# Patient Record
Sex: Female | Born: 1980 | Race: White | Hispanic: No | Marital: Single | Smoking: Current every day smoker
Health system: Southern US, Community
[De-identification: ages and names within clinical notes are randomized; demographics above are authoritative.]

## PROBLEM LIST (undated history)

## (undated) DIAGNOSIS — D3500 Benign neoplasm of unspecified adrenal gland: Secondary | ICD-10-CM

## (undated) DIAGNOSIS — K2970 Gastritis, unspecified, without bleeding: Secondary | ICD-10-CM

## (undated) DIAGNOSIS — M5412 Radiculopathy, cervical region: Secondary | ICD-10-CM

## (undated) DIAGNOSIS — M791 Myalgia, unspecified site: Secondary | ICD-10-CM

## (undated) DIAGNOSIS — F4323 Adjustment disorder with mixed anxiety and depressed mood: Secondary | ICD-10-CM

## (undated) DIAGNOSIS — R1115 Cyclical vomiting syndrome unrelated to migraine: Secondary | ICD-10-CM

## (undated) DIAGNOSIS — N289 Disorder of kidney and ureter, unspecified: Secondary | ICD-10-CM

## (undated) DIAGNOSIS — F410 Panic disorder [episodic paroxysmal anxiety] without agoraphobia: Secondary | ICD-10-CM

## (undated) HISTORY — PX: HEMORRHOID SURGERY: SHX153

## (undated) HISTORY — PX: WISDOM TOOTH EXTRACTION: SHX21

## (undated) HISTORY — PX: ESOPHAGOGASTRODUODENOSCOPY: SHX1529

## (undated) HISTORY — DX: Cyclical vomiting syndrome unrelated to migraine: R11.15

## (undated) HISTORY — DX: Benign neoplasm of unspecified adrenal gland: D35.00

---
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
## 2000-05-25 ENCOUNTER — Emergency Department (HOSPITAL_COMMUNITY): Admission: EM | Admit: 2000-05-25 | Discharge: 2000-05-25 | Payer: Self-pay | Admitting: Emergency Medicine

## 2000-05-26 ENCOUNTER — Encounter: Payer: Self-pay | Admitting: Emergency Medicine

## 2000-07-01 ENCOUNTER — Encounter: Admission: RE | Admit: 2000-07-01 | Discharge: 2000-08-16 | Payer: Self-pay | Admitting: Diagnostic Radiology

## 2000-09-19 ENCOUNTER — Emergency Department (HOSPITAL_COMMUNITY): Admission: EM | Admit: 2000-09-19 | Discharge: 2000-09-19 | Payer: Self-pay | Admitting: Emergency Medicine

## 2003-01-19 ENCOUNTER — Other Ambulatory Visit: Admission: RE | Admit: 2003-01-19 | Discharge: 2003-01-19 | Payer: Self-pay | Admitting: Internal Medicine

## 2003-05-08 ENCOUNTER — Encounter: Payer: Self-pay | Admitting: Emergency Medicine

## 2003-05-08 ENCOUNTER — Inpatient Hospital Stay (HOSPITAL_COMMUNITY): Admission: AD | Admit: 2003-05-08 | Discharge: 2003-05-08 | Payer: Self-pay | Admitting: *Deleted

## 2003-05-08 ENCOUNTER — Emergency Department (HOSPITAL_COMMUNITY): Admission: EM | Admit: 2003-05-08 | Discharge: 2003-05-08 | Payer: Self-pay | Admitting: Emergency Medicine

## 2003-05-09 ENCOUNTER — Inpatient Hospital Stay (HOSPITAL_COMMUNITY): Admission: AD | Admit: 2003-05-09 | Discharge: 2003-05-09 | Payer: Self-pay | Admitting: Obstetrics and Gynecology

## 2003-05-10 ENCOUNTER — Inpatient Hospital Stay (HOSPITAL_COMMUNITY): Admission: AD | Admit: 2003-05-10 | Discharge: 2003-05-10 | Payer: Self-pay | Admitting: Family Medicine

## 2003-10-23 ENCOUNTER — Ambulatory Visit (HOSPITAL_COMMUNITY): Admission: RE | Admit: 2003-10-23 | Discharge: 2003-10-23 | Payer: Self-pay | Admitting: Obstetrics & Gynecology

## 2004-01-12 ENCOUNTER — Inpatient Hospital Stay (HOSPITAL_COMMUNITY): Admission: AD | Admit: 2004-01-12 | Discharge: 2004-01-13 | Payer: Self-pay | Admitting: Obstetrics

## 2004-03-25 ENCOUNTER — Inpatient Hospital Stay (HOSPITAL_COMMUNITY): Admission: AD | Admit: 2004-03-25 | Discharge: 2004-03-25 | Payer: Self-pay | Admitting: Obstetrics

## 2004-03-26 ENCOUNTER — Inpatient Hospital Stay (HOSPITAL_COMMUNITY): Admission: AD | Admit: 2004-03-26 | Discharge: 2004-03-28 | Payer: Self-pay | Admitting: Obstetrics

## 2005-07-21 ENCOUNTER — Ambulatory Visit: Payer: Self-pay | Admitting: Internal Medicine

## 2005-07-21 ENCOUNTER — Other Ambulatory Visit: Admission: RE | Admit: 2005-07-21 | Discharge: 2005-07-21 | Payer: Self-pay | Admitting: Internal Medicine

## 2007-12-12 ENCOUNTER — Emergency Department (HOSPITAL_COMMUNITY): Admission: EM | Admit: 2007-12-12 | Discharge: 2007-12-12 | Payer: Self-pay | Admitting: Emergency Medicine

## 2009-01-01 ENCOUNTER — Emergency Department (HOSPITAL_BASED_OUTPATIENT_CLINIC_OR_DEPARTMENT_OTHER): Admission: EM | Admit: 2009-01-01 | Discharge: 2009-01-01 | Payer: Self-pay | Admitting: Emergency Medicine

## 2009-06-29 ENCOUNTER — Emergency Department (HOSPITAL_BASED_OUTPATIENT_CLINIC_OR_DEPARTMENT_OTHER): Admission: EM | Admit: 2009-06-29 | Discharge: 2009-06-29 | Payer: Self-pay | Admitting: Emergency Medicine

## 2009-08-08 ENCOUNTER — Emergency Department (HOSPITAL_BASED_OUTPATIENT_CLINIC_OR_DEPARTMENT_OTHER): Admission: EM | Admit: 2009-08-08 | Discharge: 2009-08-08 | Payer: Self-pay | Admitting: Emergency Medicine

## 2009-10-05 ENCOUNTER — Emergency Department (HOSPITAL_BASED_OUTPATIENT_CLINIC_OR_DEPARTMENT_OTHER): Admission: EM | Admit: 2009-10-05 | Discharge: 2009-10-05 | Payer: Self-pay | Admitting: Emergency Medicine

## 2009-11-22 ENCOUNTER — Emergency Department (HOSPITAL_BASED_OUTPATIENT_CLINIC_OR_DEPARTMENT_OTHER): Admission: EM | Admit: 2009-11-22 | Discharge: 2009-11-23 | Payer: Self-pay | Admitting: Emergency Medicine

## 2010-04-13 ENCOUNTER — Emergency Department (HOSPITAL_BASED_OUTPATIENT_CLINIC_OR_DEPARTMENT_OTHER): Admission: EM | Admit: 2010-04-13 | Discharge: 2010-04-14 | Payer: Self-pay | Admitting: Emergency Medicine

## 2010-08-14 ENCOUNTER — Emergency Department (HOSPITAL_BASED_OUTPATIENT_CLINIC_OR_DEPARTMENT_OTHER): Admission: EM | Admit: 2010-08-14 | Discharge: 2010-01-10 | Payer: Self-pay | Admitting: Emergency Medicine

## 2010-09-08 ENCOUNTER — Emergency Department (HOSPITAL_BASED_OUTPATIENT_CLINIC_OR_DEPARTMENT_OTHER)
Admission: EM | Admit: 2010-09-08 | Discharge: 2010-09-09 | Disposition: A | Discharge status: Other Acute Inpatient Hospital | Payer: Self-pay | Source: Home / Self Care | Admitting: Emergency Medicine

## 2010-09-29 ENCOUNTER — Encounter: Payer: Self-pay | Admitting: Obstetrics & Gynecology

## 2010-11-23 LAB — URINALYSIS, ROUTINE W REFLEX MICROSCOPIC
Bilirubin Urine: NEGATIVE
Glucose, UA: NEGATIVE mg/dL
Hgb urine dipstick: NEGATIVE
Ketones, ur: 15 mg/dL — AB
Leukocytes, UA: NEGATIVE
Nitrite: NEGATIVE
Protein, ur: >300 mg/dL — AB
Specific Gravity, Urine: 1.03 (ref 1.005–1.030)
Urobilinogen, UA: 1 mg/dL (ref 0.0–1.0)
pH: 7 (ref 5.0–8.0)

## 2010-11-23 LAB — COMPREHENSIVE METABOLIC PANEL
ALT: 13 U/L (ref 0–35)
AST: 20 U/L (ref 0–37)
Albumin: 5.3 g/dL — ABNORMAL HIGH (ref 3.5–5.2)
Alkaline Phosphatase: 83 U/L (ref 39–117)
BUN: 10 mg/dL (ref 6–23)
CO2: 26 mEq/L (ref 19–32)
Calcium: 10.2 mg/dL (ref 8.4–10.5)
Chloride: 98 mEq/L (ref 96–112)
Creatinine, Ser: 0.7 mg/dL (ref 0.4–1.2)
GFR calc Af Amer: >60 mL/min (ref >60)
GFR calc non Af Amer: >60 mL/min (ref >60)
Glucose, Bld: 209 mg/dL — ABNORMAL HIGH (ref 70–99)
Potassium: 3.9 mEq/L (ref 3.5–5.1)
Sodium: 136 mEq/L (ref 135–145)
Total Bilirubin: 0.7 mg/dL (ref 0.3–1.2)
Total Protein: 8.6 g/dL — ABNORMAL HIGH (ref 6.0–8.3)

## 2010-11-23 LAB — URINE MICROSCOPIC-ADD ON

## 2010-11-23 LAB — PREGNANCY, URINE: Preg Test, Ur: NEGATIVE

## 2010-11-23 LAB — LIPASE, BLOOD: Lipase: 46 U/L (ref 23–300)

## 2010-11-25 LAB — URINALYSIS, ROUTINE W REFLEX MICROSCOPIC
Bilirubin Urine: NEGATIVE
Glucose, UA: NEGATIVE mg/dL
Hgb urine dipstick: NEGATIVE
Ketones, ur: 40 mg/dL — AB
Leukocytes, UA: NEGATIVE
Nitrite: NEGATIVE
Protein, ur: 100 mg/dL — AB
Specific Gravity, Urine: 1.036 — ABNORMAL HIGH (ref 1.005–1.030)
Urobilinogen, UA: 1 mg/dL (ref 0.0–1.0)
pH: 6.5 (ref 5.0–8.0)

## 2010-11-25 LAB — PREGNANCY, URINE: Preg Test, Ur: NEGATIVE

## 2010-11-25 LAB — URINE MICROSCOPIC-ADD ON

## 2010-12-01 LAB — URINE MICROSCOPIC-ADD ON

## 2010-12-01 LAB — URINALYSIS, ROUTINE W REFLEX MICROSCOPIC
Bilirubin Urine: NEGATIVE
Glucose, UA: NEGATIVE mg/dL
Hgb urine dipstick: NEGATIVE
Ketones, ur: 15 mg/dL — AB
Leukocytes, UA: NEGATIVE
Nitrite: NEGATIVE
Protein, ur: 30 mg/dL — AB
Specific Gravity, Urine: 1.024 (ref 1.005–1.030)
Urobilinogen, UA: 0.2 mg/dL (ref 0.0–1.0)
pH: 6 (ref 5.0–8.0)

## 2010-12-01 LAB — PREGNANCY, URINE: Preg Test, Ur: NEGATIVE

## 2010-12-09 LAB — PREGNANCY, URINE: Preg Test, Ur: NEGATIVE

## 2010-12-11 LAB — URINALYSIS, ROUTINE W REFLEX MICROSCOPIC
Bilirubin Urine: NEGATIVE
Glucose, UA: NEGATIVE mg/dL
Hgb urine dipstick: NEGATIVE
Ketones, ur: 40 mg/dL — AB
Leukocytes, UA: NEGATIVE
Nitrite: NEGATIVE
Protein, ur: 30 mg/dL — AB
Specific Gravity, Urine: 1.014 (ref 1.005–1.030)
Urobilinogen, UA: 0.2 mg/dL (ref 0.0–1.0)
pH: 6 (ref 5.0–8.0)

## 2010-12-11 LAB — CBC
HCT: 48 % — ABNORMAL HIGH (ref 36.0–46.0)
Hemoglobin: 16.5 g/dL — ABNORMAL HIGH (ref 12.0–15.0)
MCHC: 34.4 g/dL (ref 30.0–36.0)
MCV: 93.5 fL (ref 78.0–100.0)
Platelets: 390 10*3/uL (ref 150–400)
RBC: 5.13 MIL/uL — ABNORMAL HIGH (ref 3.87–5.11)
RDW: 14 % (ref 11.5–15.5)
WBC: 19.5 10*3/uL — ABNORMAL HIGH (ref 4.0–10.5)

## 2010-12-11 LAB — DIFFERENTIAL
Basophils Absolute: 0.3 10*3/uL — ABNORMAL HIGH (ref 0.0–0.1)
Basophils Relative: 1 % (ref 0–1)
Eosinophils Absolute: 0 10*3/uL (ref 0.0–0.7)
Eosinophils Relative: 0 % (ref 0–5)
Lymphocytes Relative: 13 % (ref 12–46)
Lymphs Abs: 2.6 10*3/uL (ref 0.7–4.0)
Monocytes Absolute: 1 10*3/uL (ref 0.1–1.0)
Monocytes Relative: 5 % (ref 3–12)
Neutro Abs: 15.6 10*3/uL — ABNORMAL HIGH (ref 1.7–7.7)
Neutrophils Relative %: 80 % — ABNORMAL HIGH (ref 43–77)

## 2010-12-11 LAB — BASIC METABOLIC PANEL
BUN: 29 mg/dL — ABNORMAL HIGH (ref 6–23)
CO2: 29 mEq/L (ref 19–32)
Calcium: 11 mg/dL — ABNORMAL HIGH (ref 8.4–10.5)
Chloride: 85 mEq/L — ABNORMAL LOW (ref 96–112)
Creatinine, Ser: 1.3 mg/dL — ABNORMAL HIGH (ref 0.4–1.2)
GFR calc Af Amer: 59 mL/min — ABNORMAL LOW (ref >60)
GFR calc non Af Amer: 49 mL/min — ABNORMAL LOW (ref >60)
Glucose, Bld: 113 mg/dL — ABNORMAL HIGH (ref 70–99)
Potassium: 3.4 mEq/L — ABNORMAL LOW (ref 3.5–5.1)
Sodium: 135 mEq/L (ref 135–145)

## 2010-12-11 LAB — PREGNANCY, URINE: Preg Test, Ur: NEGATIVE

## 2010-12-11 LAB — URINE MICROSCOPIC-ADD ON

## 2010-12-17 LAB — RAPID STREP SCREEN (MED CTR MEBANE ONLY): Streptococcus, Group A Screen (Direct): NEGATIVE

## 2011-01-28 ENCOUNTER — Ambulatory Visit: Payer: Self-pay | Admitting: Internal Medicine

## 2011-03-10 ENCOUNTER — Emergency Department (HOSPITAL_BASED_OUTPATIENT_CLINIC_OR_DEPARTMENT_OTHER)
Admission: EM | Admit: 2011-03-10 | Discharge: 2011-03-10 | Disposition: A | Discharge status: Home / Self Care | Payer: Medicaid Other | Attending: Emergency Medicine | Admitting: Emergency Medicine

## 2011-03-10 DIAGNOSIS — N390 Urinary tract infection, site not specified: Secondary | ICD-10-CM | POA: Insufficient documentation

## 2011-03-10 DIAGNOSIS — F411 Generalized anxiety disorder: Secondary | ICD-10-CM | POA: Insufficient documentation

## 2011-03-10 DIAGNOSIS — R300 Dysuria: Secondary | ICD-10-CM | POA: Insufficient documentation

## 2011-03-10 LAB — URINE MICROSCOPIC-ADD ON

## 2011-03-10 LAB — URINALYSIS, ROUTINE W REFLEX MICROSCOPIC
Bilirubin Urine: NEGATIVE
Glucose, UA: NEGATIVE mg/dL
Ketones, ur: 15 mg/dL — AB
Nitrite: NEGATIVE
Protein, ur: 100 mg/dL — AB
Specific Gravity, Urine: 1.031 — ABNORMAL HIGH (ref 1.005–1.030)
Urobilinogen, UA: 1 mg/dL (ref 0.0–1.0)
pH: 6 (ref 5.0–8.0)

## 2011-03-10 LAB — PREGNANCY, URINE: Preg Test, Ur: NEGATIVE

## 2011-03-13 LAB — URINE CULTURE: Culture  Setup Time: 201207040605

## 2011-05-31 ENCOUNTER — Emergency Department (HOSPITAL_BASED_OUTPATIENT_CLINIC_OR_DEPARTMENT_OTHER)
Admission: EM | Admit: 2011-05-31 | Discharge: 2011-05-31 | Disposition: A | Discharge status: Home / Self Care | Payer: Medicaid Other | Attending: Emergency Medicine | Admitting: Emergency Medicine

## 2011-05-31 ENCOUNTER — Encounter: Payer: Self-pay | Admitting: *Deleted

## 2011-05-31 DIAGNOSIS — R1110 Vomiting, unspecified: Secondary | ICD-10-CM | POA: Insufficient documentation

## 2011-05-31 DIAGNOSIS — R109 Unspecified abdominal pain: Secondary | ICD-10-CM | POA: Insufficient documentation

## 2011-05-31 DIAGNOSIS — F17200 Nicotine dependence, unspecified, uncomplicated: Secondary | ICD-10-CM | POA: Insufficient documentation

## 2011-05-31 LAB — URINE MICROSCOPIC-ADD ON

## 2011-05-31 LAB — BASIC METABOLIC PANEL
BUN: 18 mg/dL (ref 6–23)
CO2: 29 mEq/L (ref 19–32)
Calcium: 12 mg/dL — ABNORMAL HIGH (ref 8.4–10.5)
Chloride: 86 mEq/L — ABNORMAL LOW (ref 96–112)
Creatinine, Ser: 1.6 mg/dL — ABNORMAL HIGH (ref 0.50–1.10)
GFR calc Af Amer: 46 mL/min — ABNORMAL LOW (ref >60)
GFR calc non Af Amer: 38 mL/min — ABNORMAL LOW (ref >60)
Glucose, Bld: 120 mg/dL — ABNORMAL HIGH (ref 70–99)
Potassium: 3.6 mEq/L (ref 3.5–5.1)
Sodium: 134 mEq/L — ABNORMAL LOW (ref 135–145)

## 2011-05-31 LAB — URINALYSIS, ROUTINE W REFLEX MICROSCOPIC
Bilirubin Urine: NEGATIVE
Glucose, UA: NEGATIVE mg/dL
Hgb urine dipstick: NEGATIVE
Ketones, ur: 15 mg/dL — AB
Nitrite: NEGATIVE
Protein, ur: 30 mg/dL — AB
Specific Gravity, Urine: 1.014 (ref 1.005–1.030)
Urobilinogen, UA: 0.2 mg/dL (ref 0.0–1.0)
pH: 5.5 (ref 5.0–8.0)

## 2011-05-31 LAB — PREGNANCY, URINE: Preg Test, Ur: NEGATIVE

## 2011-05-31 MED ORDER — METOCLOPRAMIDE HCL 5 MG/ML IJ SOLN
10.0000 mg | Freq: Once | INTRAMUSCULAR | Status: AC
  Administered 2011-05-31: 10 mg via INTRAVENOUS
  Filled 2011-05-31: qty 2

## 2011-05-31 MED ORDER — ONDANSETRON HCL 4 MG/2ML IJ SOLN
4.0000 mg | Freq: Once | INTRAMUSCULAR | Status: AC
  Administered 2011-05-31: 4 mg via INTRAVENOUS
  Filled 2011-05-31: qty 2

## 2011-05-31 MED ORDER — SODIUM CHLORIDE 0.9 % IV BOLUS (SEPSIS)
1000.0000 mL | Freq: Once | INTRAVENOUS | Status: AC
  Administered 2011-05-31: 1000 mL via INTRAVENOUS

## 2011-05-31 NOTE — ED Notes (Signed)
Patient unable to obtain urine specimen at this time.  

## 2011-05-31 NOTE — ED Notes (Signed)
I place a call to the [facility] teaching service, Dr Stephens November answered the call and I connected directly to Deliah Boston, NP on land line.

## 2011-05-31 NOTE — ED Provider Notes (Signed)
History     CSN: [id number] Arrival date & time: 05/31/2011  6:59 PM  Chief Complaint  Patient presents with  . Abdominal Pain    HPI  (Consider location/radiation/quality/duration/timing/severity/associated sxs/prior treatment)  HPI Comments: Pt states that she has a history of similar symptoms  Patient is a 29 y.o. female presenting with abdominal pain. The history is provided by the patient. No language interpreter was used.  Abdominal Pain The primary symptoms of the illness include abdominal pain, nausea and vomiting. The primary symptoms of the illness do not include diarrhea. The current episode started yesterday. The onset of the illness was sudden. The problem has not changed since onset. The patient states that she believes she is currently not pregnant. The patient has not had a change in bowel habit. Symptoms associated with the illness do not include chills, diaphoresis, constipation, urgency, frequency or back pain.    History reviewed. No pertinent past medical history.  History reviewed. No pertinent past surgical history.  History reviewed. No pertinent family history.  History  Substance Use Topics  . Smoking status: Current Everyday Smoker  . Smokeless tobacco: Not on file  . Alcohol Use: Yes    OB History    Grav Para Term Preterm Abortions TAB SAB Ect Mult Living                  Review of Systems  Review of Systems  Constitutional: Negative for chills and diaphoresis.  Gastrointestinal: Positive for nausea, vomiting and abdominal pain. Negative for diarrhea and constipation.  Genitourinary: Negative for urgency and frequency.  Musculoskeletal: Negative for back pain.  All other systems reviewed and are negative.    Allergies  Review of patient's allergies indicates no known allergies.  Home Medications   Current Outpatient Rx  Name Route Sig Dispense Refill  . ALPRAZOLAM 0.25 MG PO TABS Oral Take 0.25 mg by mouth 2 (two) times daily.          Physical Exam    BP 128/74  Pulse 86  Temp(Src) 98 F (36.7 C) (Oral)  Resp 20  Ht 5\' 5"  (1.651 m)  Wt 125 lb (56.7 kg)  BMI 20.80 kg/m2  SpO2 100%  LMP 05/23/2011  Physical Exam  Nursing note and vitals reviewed. Constitutional: She appears well-developed and well-nourished.  HENT:  Head: Normocephalic and atraumatic.  Cardiovascular: Normal rate and regular rhythm.   Pulmonary/Chest: Effort normal and breath sounds normal.  Abdominal: Soft. There is no tenderness.  Musculoskeletal: Normal range of motion.  Neurological: She is alert.  Skin: Skin is warm and dry.  Psychiatric: She has a normal mood and affect.    ED Course  Procedures (including critical care time)  Labs Reviewed  URINALYSIS, ROUTINE W REFLEX MICROSCOPIC - Abnormal; Notable for the following:    Appearance CLOUDY (*)    Ketones, ur 15 (*)    Protein, ur 30 (*)    Leukocytes, UA TRACE (*)    All other components within normal limits  BASIC METABOLIC PANEL - Abnormal; Notable for the following:    Sodium 134 (*)    Chloride 86 (*)    Glucose, Bld 120 (*)    Creatinine, Ser 1.60 (*)    Calcium 12.0 (*)    GFR calc non Af Amer 38 (*)    GFR calc Af Amer 46 (*)    All other components within normal limits  URINE MICROSCOPIC-ADD ON - Abnormal; Notable for the following:    Squamous Epithelial /  LPF FEW (*)    Bacteria, UA FEW (*)    Casts HYALINE CASTS (*) GRANULAR CAST   All other components within normal limits  PREGNANCY, URINE   No results found.   No diagnosis found.   MDM Pt to have cr rechecked:pt tolerating po without any problem and is feeling better      Medical screening examination/treatment/procedure(s) were performed by non-physician practitioner and as supervising physician I was immediately available for consultation/collaboration.  Teressa Lower, NP 05/31/11 2238  Jasmine Awe, MD 05/31/11 2250

## 2011-05-31 NOTE — ED Notes (Signed)
D/c home with no Rx given- denies pain- reports she feels much better

## 2011-05-31 NOTE — ED Notes (Signed)
Pt states that she has had abd, N,V since yesterday. Thinks she may have picked up a stomach bug or ate something bad. Also c/o cramping. "Feel dehydrated"

## 2011-06-02 LAB — DIFFERENTIAL
Basophils Absolute: 0
Basophils Relative: 0
Eosinophils Absolute: 0.2
Eosinophils Relative: 1
Lymphocytes Relative: 24
Lymphs Abs: 3.4
Monocytes Absolute: 1.2 — ABNORMAL HIGH
Monocytes Relative: 9
Neutro Abs: 9 — ABNORMAL HIGH
Neutrophils Relative %: 66

## 2011-06-02 LAB — COMPREHENSIVE METABOLIC PANEL
ALT: 18
AST: 24
Albumin: 5.2
Alkaline Phosphatase: 98
BUN: 25 — ABNORMAL HIGH
CO2: 32
Calcium: 10.7 — ABNORMAL HIGH
Chloride: 85 — ABNORMAL LOW
Creatinine, Ser: 0.93
GFR calc Af Amer: >60
GFR calc non Af Amer: >60
Glucose, Bld: 118 — ABNORMAL HIGH
Potassium: 3.5
Sodium: 131 — ABNORMAL LOW
Total Bilirubin: 1.7 — ABNORMAL HIGH
Total Protein: 8.4 — ABNORMAL HIGH

## 2011-06-02 LAB — URINALYSIS, ROUTINE W REFLEX MICROSCOPIC
Bilirubin Urine: NEGATIVE
Glucose, UA: NEGATIVE
Hgb urine dipstick: NEGATIVE
Ketones, ur: NEGATIVE
Leukocytes, UA: NEGATIVE
Nitrite: NEGATIVE
Protein, ur: 100 — AB
Specific Gravity, Urine: 1.024
Urobilinogen, UA: 0.2
pH: 6

## 2011-06-02 LAB — CBC
HCT: 56.2 — ABNORMAL HIGH
Hemoglobin: 18.8 — ABNORMAL HIGH
MCHC: 33.5
MCV: 95
Platelets: 350
RBC: 5.91 — ABNORMAL HIGH
RDW: 12.3
WBC: 13.8 — ABNORMAL HIGH

## 2011-06-02 LAB — URINE MICROSCOPIC-ADD ON

## 2011-06-02 LAB — PREGNANCY, URINE: Preg Test, Ur: NEGATIVE

## 2011-06-21 ENCOUNTER — Emergency Department (HOSPITAL_BASED_OUTPATIENT_CLINIC_OR_DEPARTMENT_OTHER)
Admission: EM | Admit: 2011-06-21 | Discharge: 2011-06-21 | Disposition: A | Discharge status: Home / Self Care | Payer: Medicaid Other | Attending: Emergency Medicine | Admitting: Emergency Medicine

## 2011-06-21 ENCOUNTER — Encounter (HOSPITAL_BASED_OUTPATIENT_CLINIC_OR_DEPARTMENT_OTHER): Payer: Self-pay | Admitting: *Deleted

## 2011-06-21 DIAGNOSIS — R809 Proteinuria, unspecified: Secondary | ICD-10-CM | POA: Insufficient documentation

## 2011-06-21 DIAGNOSIS — N390 Urinary tract infection, site not specified: Secondary | ICD-10-CM | POA: Insufficient documentation

## 2011-06-21 DIAGNOSIS — R1110 Vomiting, unspecified: Secondary | ICD-10-CM | POA: Insufficient documentation

## 2011-06-21 DIAGNOSIS — N179 Acute kidney failure, unspecified: Secondary | ICD-10-CM | POA: Insufficient documentation

## 2011-06-21 LAB — BASIC METABOLIC PANEL
BUN: 27 mg/dL — ABNORMAL HIGH (ref 6–23)
CO2: 27 mEq/L (ref 19–32)
Calcium: 11.4 mg/dL — ABNORMAL HIGH (ref 8.4–10.5)
Chloride: 75 mEq/L — ABNORMAL LOW (ref 96–112)
Creatinine, Ser: 3.7 mg/dL — ABNORMAL HIGH (ref 0.50–1.10)
GFR calc Af Amer: 18 mL/min — ABNORMAL LOW (ref >90)
GFR calc non Af Amer: 15 mL/min — ABNORMAL LOW (ref >90)
Glucose, Bld: 130 mg/dL — ABNORMAL HIGH (ref 70–99)
Potassium: 3.5 mEq/L (ref 3.5–5.1)
Sodium: 127 mEq/L — ABNORMAL LOW (ref 135–145)

## 2011-06-21 LAB — URINALYSIS, ROUTINE W REFLEX MICROSCOPIC
Bilirubin Urine: NEGATIVE
Glucose, UA: NEGATIVE mg/dL
Ketones, ur: 15 mg/dL — AB
Nitrite: NEGATIVE
Protein, ur: 100 mg/dL — AB
Specific Gravity, Urine: 1.013 (ref 1.005–1.030)
Urobilinogen, UA: 0.2 mg/dL (ref 0.0–1.0)
pH: 5.5 (ref 5.0–8.0)

## 2011-06-21 LAB — URINE MICROSCOPIC-ADD ON

## 2011-06-21 LAB — PREGNANCY, URINE: Preg Test, Ur: NEGATIVE

## 2011-06-21 MED ORDER — SULFAMETHOXAZOLE-TRIMETHOPRIM 800-160 MG PO TABS: 1.0000 | ORAL_TABLET | Freq: Two times a day (BID) | ORAL | Status: AC

## 2011-06-21 MED ORDER — ONDANSETRON HCL 4 MG/2ML IJ SOLN: 4.0000 mg | Freq: Once | INTRAMUSCULAR | Status: DC

## 2011-06-21 MED ORDER — ONDANSETRON HCL 4 MG/2ML IJ SOLN
4.0000 mg | Freq: Once | INTRAMUSCULAR | Status: AC
  Administered 2011-06-21: 4 mg via INTRAVENOUS
  Filled 2011-06-21: qty 2

## 2011-06-21 MED ORDER — METOCLOPRAMIDE HCL 5 MG/ML IJ SOLN
10.0000 mg | Freq: Once | INTRAMUSCULAR | Status: AC
  Administered 2011-06-21: 10 mg via INTRAVENOUS
  Filled 2011-06-21: qty 2

## 2011-06-21 MED ORDER — SODIUM CHLORIDE 0.9 % IV BOLUS (SEPSIS)
1000.0000 mL | Freq: Once | INTRAVENOUS | Status: AC
  Administered 2011-06-21: 1000 mL via INTRAVENOUS

## 2011-06-21 NOTE — ED Provider Notes (Signed)
Medical screening examination/treatment/procedure(s) were performed by non-physician practitioner and as supervising physician I was immediately available for consultation/collaboration.   Celene Kras, MD 06/21/11 [phone]

## 2011-06-21 NOTE — ED Provider Notes (Signed)
History     CSN: [id number] Arrival date & time: 06/21/2011  8:27 PM  Chief Complaint  Patient presents with  . Emesis    (Consider location/radiation/quality/duration/timing/severity/associated sxs/prior treatment) HPI Comments: Pt states that she had diarrhea times 1:no fever:pt c/o generalized cramps and spasms to body"because I am dehydrated":pt states that she has a history of similar symptoms, she has not followed  Patient is a 30 y.o. female presenting with vomiting. The history is provided by the patient.  Emesis  This is a recurrent problem. The current episode started yesterday. The problem occurs 5 to 10 times per day. The problem has not changed since onset.The emesis has an appearance of bilious material. There has been no fever.    History reviewed. No pertinent past medical history.  History reviewed. No pertinent past surgical history.  History reviewed. No pertinent family history.  History  Substance Use Topics  . Smoking status: Current Everyday Smoker  . Smokeless tobacco: Not on file  . Alcohol Use: Yes    OB History    Grav Para Term Preterm Abortions TAB SAB Ect Mult Living                  Review of Systems  Gastrointestinal: Positive for vomiting.  All other systems reviewed and are negative.    Allergies  Review of patient's allergies indicates no known allergies.  Home Medications   Current Outpatient Rx  Name Route Sig Dispense Refill  . ALPRAZOLAM 0.25 MG PO TABS Oral Take 0.25 mg by mouth 2 (two) times daily.        BP 124/86  Pulse 114  Temp(Src) 97.7 F (36.5 C) (Oral)  Resp 24  Ht 5\' 4"  (1.626 m)  Wt 120 lb (54.432 kg)  BMI 20.60 kg/m2  SpO2 97%  LMP 06/20/2011  Physical Exam  Nursing note and vitals reviewed. Constitutional: She is oriented to person, place, and time. She appears well-developed.  HENT:  Head: Normocephalic and atraumatic.  Eyes: Pupils are equal, round, and reactive to light.  Neck: Normal range  of motion.  Cardiovascular: Normal rate and regular rhythm.   Pulmonary/Chest: Effort normal and breath sounds normal.  Abdominal: Soft. Bowel sounds are normal.  Musculoskeletal: Normal range of motion.  Neurological: She is alert and oriented to person, place, and time.  Skin: Skin is warm and dry.  Psychiatric: She has a normal mood and affect.    ED Course  Procedures (including critical care time)  Labs Reviewed  URINALYSIS, ROUTINE W REFLEX MICROSCOPIC - Abnormal; Notable for the following:    Appearance CLOUDY (*)    Hgb urine dipstick MODERATE (*)    Ketones, ur 15 (*)    Protein, ur 100 (*)    Leukocytes, UA TRACE (*)    All other components within normal limits  BASIC METABOLIC PANEL - Abnormal; Notable for the following:    Sodium 127 (*)    Chloride 75 (*)    Glucose, Bld 130 (*)    BUN 27 (*)    Creatinine, Ser 3.70 (*)    Calcium 11.4 (*)    GFR calc non Af Amer 15 (*)    GFR calc Af Amer 18 (*)    All other components within normal limits  URINE MICROSCOPIC-ADD ON - Abnormal; Notable for the following:    Squamous Epithelial / LPF FEW (*)    Bacteria, UA MANY (*)    Casts HYALINE CASTS (*) WBC CAST   All other  components within normal limits  PREGNANCY, URINE   No results found.   1. Acute renal failure   2. UTI (urinary tract infection)       MDM  Pt left ama even thought we discussed the seriousness of finding:pt states that she will see her pcp tomorrow        Teressa Lower, NP 06/21/11 2317

## 2011-06-21 NOTE — ED Notes (Signed)
Pt states she was seen here less than a month ago for the same symptoms. Got better, but then s/s have returned. C/O muscle cramps and spasms.

## 2011-06-21 NOTE — ED Notes (Signed)
Pt unable to obtain urine specimen at present

## 2011-06-26 ENCOUNTER — Telehealth (HOSPITAL_BASED_OUTPATIENT_CLINIC_OR_DEPARTMENT_OTHER): Payer: Self-pay | Admitting: Emergency Medicine

## 2012-02-08 ENCOUNTER — Other Ambulatory Visit: Payer: Self-pay | Admitting: Obstetrics

## 2012-06-08 ENCOUNTER — Other Ambulatory Visit: Payer: Self-pay

## 2013-04-30 ENCOUNTER — Emergency Department (HOSPITAL_BASED_OUTPATIENT_CLINIC_OR_DEPARTMENT_OTHER): Payer: Medicaid Other

## 2013-04-30 ENCOUNTER — Encounter (HOSPITAL_BASED_OUTPATIENT_CLINIC_OR_DEPARTMENT_OTHER): Payer: Self-pay | Admitting: *Deleted

## 2013-04-30 ENCOUNTER — Emergency Department (HOSPITAL_BASED_OUTPATIENT_CLINIC_OR_DEPARTMENT_OTHER)
Admission: EM | Admit: 2013-04-30 | Discharge: 2013-04-30 | Disposition: A | Discharge status: Home / Self Care | Payer: Medicaid Other | Attending: Emergency Medicine | Admitting: Emergency Medicine

## 2013-04-30 DIAGNOSIS — S8990XA Unspecified injury of unspecified lower leg, initial encounter: Secondary | ICD-10-CM | POA: Insufficient documentation

## 2013-04-30 DIAGNOSIS — Y939 Activity, unspecified: Secondary | ICD-10-CM | POA: Insufficient documentation

## 2013-04-30 DIAGNOSIS — R238 Other skin changes: Secondary | ICD-10-CM | POA: Insufficient documentation

## 2013-04-30 DIAGNOSIS — W230XXA Caught, crushed, jammed, or pinched between moving objects, initial encounter: Secondary | ICD-10-CM | POA: Insufficient documentation

## 2013-04-30 DIAGNOSIS — Y929 Unspecified place or not applicable: Secondary | ICD-10-CM | POA: Insufficient documentation

## 2013-04-30 DIAGNOSIS — S99922A Unspecified injury of left foot, initial encounter: Secondary | ICD-10-CM

## 2013-04-30 DIAGNOSIS — F17200 Nicotine dependence, unspecified, uncomplicated: Secondary | ICD-10-CM | POA: Insufficient documentation

## 2013-04-30 DIAGNOSIS — Z79899 Other long term (current) drug therapy: Secondary | ICD-10-CM | POA: Insufficient documentation

## 2013-04-30 MED ORDER — NAPROXEN 500 MG PO TABS: 500.0000 mg | ORAL_TABLET | Freq: Two times a day (BID) | ORAL | Status: DC

## 2013-04-30 NOTE — ED Notes (Signed)
Patient fell last night and injured her left foot.  States that it is painful especial when put weight on it.

## 2013-04-30 NOTE — ED Notes (Signed)
Patient transported to X-ray via stretcher 

## 2013-04-30 NOTE — ED Provider Notes (Signed)
CSN: [id number]     Arrival date & time 04/30/13  1530 History     First MD Initiated Contact with Patient 04/30/13 1550     Chief Complaint  Patient presents with  . Foot Injury   (Consider location/radiation/quality/duration/timing/severity/associated sxs/prior Treatment) HPI Comments: Patient is a 32 year old female who presents with left foot pain since last night. Patient reports sliding down an embankment and "jamming" her left foot. Patient reports sudden onset of throbbing, severe pain that is localized to left foot ankle. Patient reports progressive worsening of pain. Palpation and weight bearing activity make the pain worse. Nothing makes the pain better. Patient reports associated bruising. Patient has not tried anything for pain relief. Patient denies obvious deformity, numbness/tingling, coolness/weakness of extremity, and any other injury.     Patient is a 32 y.o. female presenting with foot injury.  Foot Injury   History reviewed. No pertinent past medical history. History reviewed. No pertinent past surgical history. History reviewed. No pertinent family history. History  Substance Use Topics  . Smoking status: Current Every Day Smoker  . Smokeless tobacco: Not on file  . Alcohol Use: Yes   OB History   Grav Para Term Preterm Abortions TAB SAB Ect Mult Living                 Review of Systems  Musculoskeletal: Positive for arthralgias.  Skin: Positive for color change.  All other systems reviewed and are negative.    Allergies  Septra ds and Sulfa antibiotics  Home Medications   Current Outpatient Rx  Name  Route  Sig  Dispense  Refill  . cyclobenzaprine (FLEXERIL) 10 MG tablet   Oral   Take 10 mg by mouth 3 (three) times daily as needed for muscle spasms.         . ALPRAZolam (XANAX) 0.25 MG tablet   Oral   Take 0.25 mg by mouth 2 (two) times daily.           Marland Kitchen ibuprofen (ADVIL,MOTRIN) 200 MG tablet   Oral   Take 800 mg by mouth every 6  (six) hours as needed. For pain          . ondansetron (ZOFRAN) 4 MG tablet   Oral   Take 4 mg by mouth once.            BP 121/68  Temp(Src) 98.4 F (36.9 C) (Oral)  Resp 18  Ht 5\' 2"  (1.575 m)  Wt 115 lb (52.164 kg)  BMI 21.03 kg/m2  SpO2 100%  LMP 04/13/2013 Physical Exam  Nursing note and vitals reviewed. Constitutional: She is oriented to person, place, and time. She appears well-developed and well-nourished. No distress.  HENT:  Head: Normocephalic and atraumatic.  Eyes: Conjunctivae are normal.  Neck: Normal range of motion.  Cardiovascular: Normal rate and regular rhythm.  Exam reveals no gallop and no friction rub.   No murmur heard. Pulmonary/Chest: Effort normal and breath sounds normal. She has no wheezes. She has no rales. She exhibits no tenderness.  Musculoskeletal: Normal range of motion.  Plantar aspect of left foot tender to palpation over bruising. No obvious deformity.   Neurological: She is alert and oriented to person, place, and time. Coordination normal.  Speech is goal-oriented. Moves limbs without ataxia.   Skin: Skin is warm and dry.  Bruising noted to plantar aspect of left foot that is tender to palpation. No wound.   Psychiatric: She has a normal mood and affect. Her  behavior is normal.    ED Course   Procedures (including critical care time)  SPLINT APPLICATION Date/Time: 01/13/2013 3:38 PM Authorized by: Emilia Beck Consent: Verbal consent obtained. Risks and benefits: risks, benefits and alternatives were discussed Consent given by: patient Splint applied by: orthopedic technician Location details: left ankle Splint type: ACE wrap Supplies used: ACE wrap Post-procedure: The splinted body part was neurovascularly unchanged following the procedure. Patient tolerance: Patient tolerated the procedure well with no immediate complications.     Labs Reviewed - No data to display Dg Foot Complete Left  04/30/2013   *RADIOLOGY  REPORT*  Clinical Data: Plaque are pain.  Left foot injury last night.  LEFT FOOT - COMPLETE 3+ VIEW  Comparison: None  Findings: No fracture.  The joints normally spaced and aligned. The soft tissues are unremarkable.  IMPRESSION: Normal left foot radiographs.   Original Report Authenticated By: Amie Portland, M.D.   1. Foot injury, left, initial encounter     MDM  4:27 PM Xray unremarkable for fracture. No signs of neurovascular compromise. Patient will have ACE bandage and crutches. Patient will have Naprosyn to take as needed for pain. Patient instructed to rest, ice and elevated injury.   Emilia Beck, PA-C 04/30/13 1753

## 2013-05-04 NOTE — ED Provider Notes (Signed)
Medical screening examination/treatment/procedure(s) were performed by non-physician practitioner and as supervising physician I was immediately available for consultation/collaboration.  Debroh Sieloff, MD 05/04/13 0720 

## 2013-05-26 ENCOUNTER — Ambulatory Visit: Payer: Self-pay | Admitting: Advanced Practice Midwife

## 2013-06-09 ENCOUNTER — Encounter: Payer: Self-pay | Admitting: Advanced Practice Midwife

## 2013-06-09 ENCOUNTER — Ambulatory Visit (INDEPENDENT_AMBULATORY_CARE_PROVIDER_SITE_OTHER): Payer: Medicaid Other | Admitting: Advanced Practice Midwife

## 2013-06-09 VITALS — BP 125/79 | HR 97 | Temp 98.3°F | Ht 63.0 in | Wt 129.4 lb

## 2013-06-09 DIAGNOSIS — A64 Unspecified sexually transmitted disease: Secondary | ICD-10-CM

## 2013-06-09 DIAGNOSIS — F17200 Nicotine dependence, unspecified, uncomplicated: Secondary | ICD-10-CM | POA: Insufficient documentation

## 2013-06-09 DIAGNOSIS — Z113 Encounter for screening for infections with a predominantly sexual mode of transmission: Secondary | ICD-10-CM | POA: Insufficient documentation

## 2013-06-09 DIAGNOSIS — Z0000 Encounter for general adult medical examination without abnormal findings: Secondary | ICD-10-CM

## 2013-06-09 DIAGNOSIS — Z01419 Encounter for gynecological examination (general) (routine) without abnormal findings: Secondary | ICD-10-CM | POA: Insufficient documentation

## 2013-06-09 MED ORDER — LEVONORGESTREL-ETHINYL ESTRAD 0.15-30 MG-MCG PO TABS: 1.0000 | ORAL_TABLET | Freq: Every day | ORAL | Status: DC

## 2013-06-09 NOTE — Progress Notes (Signed)
. Subjective:     Natalie Joseph is a 32 y.o. female here for a routine exam.  Current complaints: She would like to have all STD testing, including hepatitis and herpes.  Personal health questionnaire reviewed: yes.  9yo daughter is doing well. Delivered w/ [facility].   Gynecologic History Patient's last menstrual period was 05/12/2013. Contraception: OCP (estrogen/progesterone) Last Pap: 04/2011. Results were: normal Last mammogram: 2014. Results were: normal  Patient had a recent mammogram that was normal.   Obstetric History OB History  No data available     The following portions of the patient's history were reviewed and updated as appropriate: allergies, current medications, past family history, past medical history, past social history, past surgical history and problem list.  Review of Systems A comprehensive review of systems was negative.    Objective:    BP 125/79  Pulse 97  Temp(Src) 98.3 F (36.8 C) (Oral)  Ht 5\' 3"  (1.6 m)  Wt 129 lb 6.4 oz (58.695 kg)  BMI 22.93 kg/m2  LMP 05/12/2013  General Appearance:    Alert, cooperative, no distress, appears stated age  Head:    Normocephalic, without obvious abnormality, atraumatic  Eyes:    PERRL, conjunctiva/corneas clear, EOM's intact, fundi    benign, both eyes  Ears:    Normal TM's and external ear canals, both ears  Nose:   Nares normal, septum midline, mucosa normal, no drainage    or sinus tenderness  Throat:   Lips, mucosa, and tongue normal; teeth and gums normal  Neck:   Supple, symmetrical, trachea midline, no adenopathy;    thyroid:  no enlargement/tenderness/nodules; no carotid   bruit or JVD  Back:     Symmetric, no curvature, ROM normal, no CVA tenderness  Lungs:     Clear to auscultation bilaterally, respirations unlabored  Chest Wall:    No tenderness or deformity   Heart:    Regular rate and rhythm, S1 and S2 normal, no murmur, rub   or gallop  Breast Exam:    No tenderness, masses, or  nipple abnormality  Abdomen:     Soft, non-tender, bowel sounds active all four quadrants,    no masses, no organomegaly  Genitalia:    Normal female without lesion, discharge or tenderness   Negative CMT  Extremities:   Extremities normal, atraumatic, no cyanosis or edema  Pulses:   2+ and symmetric all extremities  Skin:   Skin color, texture, turgor normal, no rashes or lesions  Lymph nodes:   Cervical, supraclavicular, and axillary nodes normal  Neurologic:   CNII-XII intact, normal strength, sensation and reflexes    throughout      Assessment:    Healthy female exam.    Patient Active Problem List   Diagnosis Date Noted  . Smoker 06/09/2013  . Well woman exam with routine gynecological exam 06/09/2013  . Screen for STD (sexually transmitted disease) 06/09/2013    Plan:    Education reviewed: calcium supplements, depression evaluation, low fat, low cholesterol diet, safe sex/STD prevention, self breast exams, skin cancer screening, smoking cessation and weight bearing exercise. Contraception: OCP (estrogen/progesterone). Follow up in: [facility] .Marland Kitchen   Patient has resources to quit.  Notify patient of contraindication of smoker and OCP after 32yo. Reviewed increase risk of complications and health disease w/ smoking including abnormal pap smears and increased risk of blood clot.   30 min spent with patient greater than 80% spent in counseling and coordination of care.   Jamorion Gomillion Wilson Singer  CNM

## 2013-06-10 LAB — HEPATITIS B SURFACE ANTIGEN: Hepatitis B Surface Ag: NEGATIVE

## 2013-06-10 LAB — HEPATITIS C ANTIBODY: HCV Ab: NEGATIVE

## 2013-06-10 LAB — GC/CHLAMYDIA PROBE AMP
CT Probe RNA: NEGATIVE
GC Probe RNA: NEGATIVE

## 2013-06-10 LAB — HIV ANTIBODY (ROUTINE TESTING W REFLEX): HIV: NONREACTIVE

## 2013-06-10 LAB — RPR

## 2013-06-12 LAB — HSV(HERPES SMPLX)ABS-I+II(IGG+IGM)-BLD
HSV 1 Glycoprotein G Ab, IgG: 1.35 IV — ABNORMAL HIGH
HSV 2 Glycoprotein G Ab, IgG: <0.1 IV
Herpes Simplex Vrs I&II-IgM Ab (EIA): 3.23 INDEX — ABNORMAL HIGH

## 2013-06-13 ENCOUNTER — Telehealth: Payer: Self-pay | Admitting: Advanced Practice Midwife

## 2013-06-13 LAB — PAP IG, CT-NG, RFX HPV ASCU
Chlamydia Probe Amp: NEGATIVE
GC Probe Amp: NEGATIVE

## 2013-06-26 NOTE — Telephone Encounter (Signed)
Error

## 2013-10-18 ENCOUNTER — Ambulatory Visit: Payer: Medicaid Other | Admitting: Obstetrics & Gynecology

## 2013-10-25 ENCOUNTER — Encounter: Payer: Self-pay | Admitting: Obstetrics & Gynecology

## 2013-10-25 ENCOUNTER — Ambulatory Visit (INDEPENDENT_AMBULATORY_CARE_PROVIDER_SITE_OTHER): Payer: Medicaid Other | Admitting: Obstetrics & Gynecology

## 2013-10-25 VITALS — BP 127/80 | HR 92 | Temp 98.0°F | Ht 64.0 in | Wt 128.0 lb

## 2013-10-25 DIAGNOSIS — Z113 Encounter for screening for infections with a predominantly sexual mode of transmission: Secondary | ICD-10-CM

## 2013-10-25 DIAGNOSIS — Z131 Encounter for screening for diabetes mellitus: Secondary | ICD-10-CM

## 2013-10-25 LAB — HIV ANTIBODY (ROUTINE TESTING W REFLEX): HIV: NONREACTIVE

## 2013-10-25 LAB — HEMOGLOBIN A1C
Hgb A1c MFr Bld: 5.1 % (ref <5.7)
Mean Plasma Glucose: 100 mg/dL (ref <117)

## 2013-10-25 LAB — HEPATITIS B SURFACE ANTIGEN: Hepatitis B Surface Ag: NEGATIVE

## 2013-10-25 LAB — RPR

## 2013-10-25 LAB — HEPATITIS C ANTIBODY: HCV Ab: NEGATIVE

## 2013-10-25 NOTE — Progress Notes (Signed)
Subjective:   Subjective:     Natalie Joseph is a 33 y.o. female here for a routine exam.  Current complaints: none.Patient is requesting std testing.  Personal health questionnaire reviewed: yes.   Gynecologic History Patient's last menstrual period was 10/15/2013. Contraception: none Last Pap: unknown  The following portions of the patient's history were reviewed and updated as appropriate: allergies and current medications.  Review of Systems Pertinent items are noted in HPI.    Objective:   General:  alert     Abdomen: soft, non-tender; bowel sounds normal; no masses,  no organomegaly   Vulva:  normal  Vagina: normal vagina  Cervix:  no lesions  Corpus: normal size, contour, position, consistency, mobility, non-tender  Adnexa:  normal adnexa    Assessment:    Healthy female exam.    Plan:   Std screening HIV. RPR. HEP B, HEP C, GC, A1C

## 2013-10-26 LAB — WET PREP BY MOLECULAR PROBE
Candida species: NEGATIVE
Gardnerella vaginalis: NEGATIVE
Trichomonas vaginosis: NEGATIVE

## 2013-10-26 LAB — GC/CHLAMYDIA PROBE AMP
CT Probe RNA: NEGATIVE
GC Probe RNA: NEGATIVE

## 2013-10-26 LAB — HSV(HERPES SIMPLEX VRS) I + II AB-IGG
HSV 1 Glycoprotein G Ab, IgG: 0.72 IV
HSV 2 Glycoprotein G Ab, IgG: <0.1 IV

## 2013-12-25 ENCOUNTER — Emergency Department (HOSPITAL_BASED_OUTPATIENT_CLINIC_OR_DEPARTMENT_OTHER): Payer: Medicaid Other

## 2013-12-25 ENCOUNTER — Encounter (HOSPITAL_BASED_OUTPATIENT_CLINIC_OR_DEPARTMENT_OTHER): Payer: Self-pay | Admitting: Emergency Medicine

## 2013-12-25 ENCOUNTER — Emergency Department (HOSPITAL_BASED_OUTPATIENT_CLINIC_OR_DEPARTMENT_OTHER)
Admission: EM | Admit: 2013-12-25 | Discharge: 2013-12-25 | Disposition: A | Discharge status: Home / Self Care | Payer: Medicaid Other | Attending: Emergency Medicine | Admitting: Emergency Medicine

## 2013-12-25 DIAGNOSIS — D72829 Elevated white blood cell count, unspecified: Secondary | ICD-10-CM | POA: Insufficient documentation

## 2013-12-25 DIAGNOSIS — R112 Nausea with vomiting, unspecified: Secondary | ICD-10-CM | POA: Insufficient documentation

## 2013-12-25 DIAGNOSIS — R1110 Vomiting, unspecified: Secondary | ICD-10-CM

## 2013-12-25 DIAGNOSIS — R748 Abnormal levels of other serum enzymes: Secondary | ICD-10-CM | POA: Insufficient documentation

## 2013-12-25 DIAGNOSIS — Z79899 Other long term (current) drug therapy: Secondary | ICD-10-CM | POA: Insufficient documentation

## 2013-12-25 DIAGNOSIS — Z3202 Encounter for pregnancy test, result negative: Secondary | ICD-10-CM | POA: Insufficient documentation

## 2013-12-25 DIAGNOSIS — E872 Acidosis, unspecified: Secondary | ICD-10-CM | POA: Insufficient documentation

## 2013-12-25 DIAGNOSIS — E876 Hypokalemia: Secondary | ICD-10-CM

## 2013-12-25 DIAGNOSIS — F17200 Nicotine dependence, unspecified, uncomplicated: Secondary | ICD-10-CM | POA: Insufficient documentation

## 2013-12-25 DIAGNOSIS — R109 Unspecified abdominal pain: Secondary | ICD-10-CM | POA: Insufficient documentation

## 2013-12-25 DIAGNOSIS — R000 Tachycardia, unspecified: Secondary | ICD-10-CM | POA: Insufficient documentation

## 2013-12-25 DIAGNOSIS — E860 Dehydration: Secondary | ICD-10-CM

## 2013-12-25 LAB — URINALYSIS, ROUTINE W REFLEX MICROSCOPIC
Bilirubin Urine: NEGATIVE
Glucose, UA: NEGATIVE mg/dL
Ketones, ur: 15 mg/dL — AB
Leukocytes, UA: NEGATIVE
Nitrite: NEGATIVE
Protein, ur: 100 mg/dL — AB
Specific Gravity, Urine: 1.007 (ref 1.005–1.030)
Urobilinogen, UA: 0.2 mg/dL (ref 0.0–1.0)
pH: 6 (ref 5.0–8.0)

## 2013-12-25 LAB — CBC WITH DIFFERENTIAL/PLATELET
Basophils Absolute: 0 10*3/uL (ref 0.0–0.1)
Basophils Relative: 0 % (ref 0–1)
Eosinophils Absolute: 0 10*3/uL (ref 0.0–0.7)
Eosinophils Relative: 0 % (ref 0–5)
HCT: 47.7 % — ABNORMAL HIGH (ref 36.0–46.0)
Hemoglobin: 17.6 g/dL — ABNORMAL HIGH (ref 12.0–15.0)
Lymphocytes Relative: 11 % — ABNORMAL LOW (ref 12–46)
Lymphs Abs: 2.4 10*3/uL (ref 0.7–4.0)
MCH: 33.5 pg (ref 26.0–34.0)
MCHC: 36.9 g/dL — ABNORMAL HIGH (ref 30.0–36.0)
MCV: 90.9 fL (ref 78.0–100.0)
Monocytes Absolute: 2.7 10*3/uL — ABNORMAL HIGH (ref 0.1–1.0)
Monocytes Relative: 12 % (ref 3–12)
Neutro Abs: 17 10*3/uL — ABNORMAL HIGH (ref 1.7–7.7)
Neutrophils Relative %: 77 % (ref 43–77)
Platelets: 341 10*3/uL (ref 150–400)
RBC: 5.25 MIL/uL — ABNORMAL HIGH (ref 3.87–5.11)
RDW: 13.1 % (ref 11.5–15.5)
WBC: 22.1 10*3/uL — ABNORMAL HIGH (ref 4.0–10.5)

## 2013-12-25 LAB — COMPREHENSIVE METABOLIC PANEL
ALT: 14 U/L (ref 0–35)
AST: 21 U/L (ref 0–37)
Albumin: 5.4 g/dL — ABNORMAL HIGH (ref 3.5–5.2)
Alkaline Phosphatase: 74 U/L (ref 39–117)
BUN: 19 mg/dL (ref 6–23)
CO2: 34 mEq/L — ABNORMAL HIGH (ref 19–32)
Calcium: 10.5 mg/dL (ref 8.4–10.5)
Chloride: 74 mEq/L — ABNORMAL LOW (ref 96–112)
Creatinine, Ser: 1.2 mg/dL — ABNORMAL HIGH (ref 0.50–1.10)
GFR calc Af Amer: 68 mL/min — ABNORMAL LOW (ref >90)
GFR calc non Af Amer: 59 mL/min — ABNORMAL LOW (ref >90)
Glucose, Bld: 102 mg/dL — ABNORMAL HIGH (ref 70–99)
Potassium: 2.4 mEq/L — CL (ref 3.7–5.3)
Sodium: 129 mEq/L — ABNORMAL LOW (ref 137–147)
Total Bilirubin: 1 mg/dL (ref 0.3–1.2)
Total Protein: 8.6 g/dL — ABNORMAL HIGH (ref 6.0–8.3)

## 2013-12-25 LAB — URINE MICROSCOPIC-ADD ON

## 2013-12-25 LAB — PREGNANCY, URINE: Preg Test, Ur: NEGATIVE

## 2013-12-25 LAB — I-STAT CG4 LACTIC ACID, ED: Lactic Acid, Venous: 2.28 mmol/L — ABNORMAL HIGH (ref 0.5–2.2)

## 2013-12-25 LAB — BASIC METABOLIC PANEL
BUN: 15 mg/dL (ref 6–23)
CO2: 33 mEq/L — ABNORMAL HIGH (ref 19–32)
Calcium: 9 mg/dL (ref 8.4–10.5)
Chloride: 87 mEq/L — ABNORMAL LOW (ref 96–112)
Creatinine, Ser: 0.9 mg/dL (ref 0.50–1.10)
GFR calc Af Amer: >90 mL/min (ref >90)
GFR calc non Af Amer: 83 mL/min — ABNORMAL LOW (ref >90)
Glucose, Bld: 110 mg/dL — ABNORMAL HIGH (ref 70–99)
Potassium: 3.3 mEq/L — ABNORMAL LOW (ref 3.7–5.3)
Sodium: 133 mEq/L — ABNORMAL LOW (ref 137–147)

## 2013-12-25 LAB — LIPASE, BLOOD: Lipase: 71 U/L — ABNORMAL HIGH (ref 11–59)

## 2013-12-25 MED ORDER — SODIUM CHLORIDE 0.9 % IV BOLUS (SEPSIS)
1000.0000 mL | Freq: Once | INTRAVENOUS | Status: AC
  Administered 2013-12-25: 1000 mL via INTRAVENOUS

## 2013-12-25 MED ORDER — POTASSIUM CHLORIDE 10 MEQ/100ML IV SOLN
10.0000 meq | INTRAVENOUS | Status: AC
  Administered 2013-12-25 (×2): 10 meq via INTRAVENOUS
  Filled 2013-12-25 (×2): qty 100

## 2013-12-25 MED ORDER — ONDANSETRON HCL 4 MG/2ML IJ SOLN
4.0000 mg | Freq: Once | INTRAMUSCULAR | Status: AC
  Administered 2013-12-25: 4 mg via INTRAVENOUS
  Filled 2013-12-25: qty 2

## 2013-12-25 MED ORDER — MORPHINE SULFATE 4 MG/ML IJ SOLN
4.0000 mg | Freq: Once | INTRAMUSCULAR | Status: AC
  Administered 2013-12-25: 4 mg via INTRAVENOUS
  Filled 2013-12-25: qty 1

## 2013-12-25 MED ORDER — POTASSIUM CHLORIDE 20 MEQ/15ML (10%) PO LIQD
40.0000 meq | Freq: Once | ORAL | Status: AC
Start: 2013-12-25 — End: 2013-12-25
  Administered 2013-12-25: 40 meq via ORAL
  Filled 2013-12-25: qty 30

## 2013-12-25 MED ORDER — POTASSIUM CHLORIDE ER 20 MEQ PO TBCR: 10.0000 meq | EXTENDED_RELEASE_TABLET | Freq: Every day | ORAL | Status: DC

## 2013-12-25 NOTE — ED Provider Notes (Signed)
CSN: [id number]     Arrival date & time 12/25/13  1713 History  This chart was scribed for Merryl Hacker, MD by Celesta Gentile, ED Scribe. The patient was seen in room MH06/MH06. Patient's care was started at 5:26 PM.    Chief Complaint  Patient presents with  . Emesis   The history is provided by the patient. No language interpreter was used.   HPI Comments: Natalie Joseph is a 33 y.o. female who presents to the Emergency Department complaining of multiple episodes of emesis that started about 4 days ago.  Pt denies experiencing nausea and diarrhea.  She also denies any blood present in her vomit.  Pt states she has been able to keep fluids down, but not solids.  She reports the only thing she has eaten today is a cracker.  Pt states she is extremely weak.  Pt reports she attempted to go into work today, but states she could barely stand up at work.  Pt is unsure of fevers and chills, because she takes hot baths after the vomiting episodes.  Pt also states she is experiencing bilateral lower back pain.  She rates her back pain as 8 out of 10.  She states she hasn't urinated since 12:00PM today and last night she didn't urinate between 5-11PM.  Pt reports a couple of years ago she was in kidney failure, but currently doesn't have any complications.  Pt states she takes birth control and there isn't a possibility she is pregnant.  Pt reports her GI doctor is Dr. Truman Hayward.  She states she takes Zofran regularly.    History reviewed. No pertinent past medical history. History reviewed. No pertinent past surgical history. Family History  Problem Relation Age of Onset  . Hypertension Mother   . Hyperlipidemia Mother   . Kidney disease Father   . Hypertension Father   . Depression Father   . Diabetes Father   . Heart disease Father   . Cancer Paternal Aunt     breast  . Diabetes Paternal Aunt   . Stroke Maternal Grandmother   . Cancer Maternal Grandfather     lung  . Stroke Paternal  Grandmother   . Heart disease Paternal Grandfather    History  Substance Use Topics  . Smoking status: Current Every Day Smoker -- 1.50 packs/day for 17 years  . Smokeless tobacco: Not on file  . Alcohol Use: No   OB History   Grav Para Term Preterm Abortions TAB SAB Ect Mult Living                 Review of Systems  Constitutional: Negative for fever.  Respiratory: Negative for cough, chest tightness and shortness of breath.   Cardiovascular: Negative for chest pain.  Gastrointestinal: Positive for nausea, vomiting and abdominal pain. Negative for diarrhea.  Genitourinary: Negative for dysuria.  Musculoskeletal: Negative for back pain.  Skin: Negative for wound.  Neurological: Negative for headaches.  Psychiatric/Behavioral: Negative for confusion.  All other systems reviewed and are negative.     Allergies  Septra ds and Sulfa antibiotics  Home Medications   Prior to Admission medications   Medication Sig Start Date End Date Taking? Authorizing Provider  ALPRAZolam (XANAX) 0.25 MG tablet Take 0.25 mg by mouth 2 (two) times daily.     Daylene Posey, FNP  cyclobenzaprine (FLEXERIL) 10 MG tablet Take 10 mg by mouth 3 (three) times daily as needed for muscle spasms.    Daylene Posey, FNP  ibuprofen (ADVIL,MOTRIN) 200 MG tablet Take 800 mg by mouth every 6 (six) hours as needed. For pain     Historical Provider, MD  ondansetron (ZOFRAN) 4 MG tablet Take 4 mg by mouth once.     Daylene Posey, FNP   Triage Vitals: BP 117/80  Pulse 102  Temp(Src) 98.2 F (36.8 C) (Oral)  Resp 16  Ht 5\' 4"  (1.626 m)  Wt 125 lb (56.7 kg)  BMI 21.45 kg/m2  SpO2 97%  LMP 12/11/2013  Physical Exam  Nursing note and vitals reviewed. Constitutional: She is oriented to person, place, and time. No distress.  thin  HENT:  Head: Normocephalic and atraumatic.  Eyes: EOM are normal. Pupils are equal, round, and reactive to light.  Neck: Neck supple.  Cardiovascular: Regular rhythm and  normal heart sounds.   No murmur heard. tachycardia  Pulmonary/Chest: Effort normal and breath sounds normal. No respiratory distress. She has no wheezes.  Abdominal: Soft. Bowel sounds are normal. There is no tenderness. There is no rebound and no guarding.  Neurological: She is alert and oriented to person, place, and time.  Skin: Skin is warm and dry.  Psychiatric: She has a normal mood and affect.    ED Course  Procedures (including critical care time) DIAGNOSTIC STUDIES: Oxygen Saturation is 97% on RA, normal by my interpretation.    COORDINATION OF CARE: 5:35 PM-Will order UA, pregnancy screen, CBC, and Comprehensive metabolic panel.  Will order Zofran and IV fluids.  Patient informed of current plan of treatment and evaluation and agrees with plan.    Results for orders placed during the hospital encounter of 12/25/13  URINALYSIS, ROUTINE W REFLEX MICROSCOPIC      Result Value Ref Range   Color, Urine YELLOW  YELLOW   APPearance CLOUDY (*) CLEAR   Specific Gravity, Urine 1.007  1.005 - 1.030   pH 6.0  5.0 - 8.0   Glucose, UA NEGATIVE  NEGATIVE mg/dL   Hgb urine dipstick TRACE (*) NEGATIVE   Bilirubin Urine NEGATIVE  NEGATIVE   Ketones, ur 15 (*) NEGATIVE mg/dL   Protein, ur 100 (*) NEGATIVE mg/dL   Urobilinogen, UA 0.2  0.0 - 1.0 mg/dL   Nitrite NEGATIVE  NEGATIVE   Leukocytes, UA NEGATIVE  NEGATIVE  PREGNANCY, URINE      Result Value Ref Range   Preg Test, Ur NEGATIVE  NEGATIVE  CBC WITH DIFFERENTIAL      Result Value Ref Range   WBC 22.1 (*) 4.0 - 10.5 K/uL   RBC 5.25 (*) 3.87 - 5.11 MIL/uL   Hemoglobin 17.6 (*) 12.0 - 15.0 g/dL   HCT 47.7 (*) 36.0 - 46.0 %   MCV 90.9  78.0 - 100.0 fL   MCH 33.5  26.0 - 34.0 pg   MCHC 36.9 (*) 30.0 - 36.0 g/dL   RDW 13.1  11.5 - 15.5 %   Platelets 341  150 - 400 K/uL   Neutrophils Relative % 77  43 - 77 %   Neutro Abs 17.0 (*) 1.7 - 7.7 K/uL   Lymphocytes Relative 11 (*) 12 - 46 %   Lymphs Abs 2.4  0.7 - 4.0 K/uL    Monocytes Relative 12  3 - 12 %   Monocytes Absolute 2.7 (*) 0.1 - 1.0 K/uL   Eosinophils Relative 0  0 - 5 %   Eosinophils Absolute 0.0  0.0 - 0.7 K/uL   Basophils Relative 0  0 - 1 %   Basophils Absolute 0.0  0.0 - 0.1 K/uL  COMPREHENSIVE METABOLIC PANEL      Result Value Ref Range   Sodium 129 (*) 137 - 147 mEq/L   Potassium 2.4 (*) 3.7 - 5.3 mEq/L   Chloride 74 (*) 96 - 112 mEq/L   CO2 34 (*) 19 - 32 mEq/L   Glucose, Bld 102 (*) 70 - 99 mg/dL   BUN 19  6 - 23 mg/dL   Creatinine, Ser 1.20 (*) 0.50 - 1.10 mg/dL   Calcium 10.5  8.4 - 10.5 mg/dL   Total Protein 8.6 (*) 6.0 - 8.3 g/dL   Albumin 5.4 (*) 3.5 - 5.2 g/dL   AST 21  0 - 37 U/L   ALT 14  0 - 35 U/L   Alkaline Phosphatase 74  39 - 117 U/L   Total Bilirubin 1.0  0.3 - 1.2 mg/dL   GFR calc non Af Amer 59 (*) >90 mL/min   GFR calc Af Amer 68 (*) >90 mL/min  LIPASE, BLOOD      Result Value Ref Range   Lipase 71 (*) 11 - 59 U/L  URINE MICROSCOPIC-ADD ON      Result Value Ref Range   Squamous Epithelial / LPF RARE  RARE   WBC, UA 3-6  <3 WBC/hpf   RBC / HPF 3-6  <3 RBC/hpf   Bacteria, UA FEW (*) RARE   Urine-Other MUCOUS PRESENT    BASIC METABOLIC PANEL      Result Value Ref Range   Sodium 133 (*) 137 - 147 mEq/L   Potassium 3.3 (*) 3.7 - 5.3 mEq/L   Chloride 87 (*) 96 - 112 mEq/L   CO2 33 (*) 19 - 32 mEq/L   Glucose, Bld 110 (*) 70 - 99 mg/dL   BUN 15  6 - 23 mg/dL   Creatinine, Ser 0.90  0.50 - 1.10 mg/dL   Calcium 9.0  8.4 - 10.5 mg/dL   GFR calc non Af Amer 83 (*) >90 mL/min   GFR calc Af Amer >90  >90 mL/min  I-STAT CG4 LACTIC ACID, ED      Result Value Ref Range   Lactic Acid, Venous 2.28 (*) 0.5 - 2.2 mmol/L    MDM   Final diagnoses:  Hypokalemia  Recurrent vomiting  Dehydration   Patient presents with vomiting. Reports a history of cyclic vomiting. She's seen by Dr. Truman Hayward and has been referred to [facility].  She is thin but nontoxic on exam. No signs of peritonitis.  Patient  was given fluids and Zofran. Workup initially notable for mild lactic acidosis at 2.28 (normal is 2.2), leukocytosis to 22.1, sodium of 129, K. of 2.4, chloride of 74, CO2 of 34 creatinine of 1.2. Lipase also mildly elevated at 71. Patient has evidence of dehydration and hemoconcentration. KUB of the abdomen is nonobstructive. Patient has an anion gap of 21. At baseline is 17-20. Patient's old labs indicate chronic leukocytosis as well as hyponatremia and hypochloremia. Patient was given 2 runs of IV potassium as well as by mouth potassium. She's had no significant emesis while in the ER. Discuss with patient admission for hydration and potassium replacement. Patient is adamant that she wants to be discharged. She's afraid she will lose her job if she does not show up tomorrow.  Repeat abdominal exam is reassuring without evidence of peritonitis. Will not pursue further imaging at this time. Lab work is similar to prior in the setting of acute vomiting with the exception of  hypokalemia. Repeat BMP she is to improve As well as potassium of 3.3. Discuss with patient that if she is discharged home, she will need to have close followup with her primary physician for repeat potassium. She is on Reglan, Zofran, and Phenergan for vomiting at home already. Patient will be started on K-Dur 20 meq once daily. She was given strict return cautions and is to return if she has any new or worsening symptoms.   I personally performed the services described in this documentation, which was scribed in my presence. The recorded information has been reviewed and is accurate.      Merryl Hacker, MD 12/25/13 2209

## 2013-12-25 NOTE — ED Notes (Signed)
LAC drawn by this RT. Results 2.28 hand delivered to Dr. Dina Rich.

## 2013-12-25 NOTE — ED Notes (Signed)
Pt c/o vomiting x 3 days. 

## 2013-12-25 NOTE — ED Notes (Signed)
Pt aware that urine specimen is needed, advised to let staff know when she can obtain the specimen.

## 2013-12-25 NOTE — ED Notes (Signed)
Critical K+ (2.4) reported to Dr. Dina Rich.

## 2013-12-25 NOTE — ED Notes (Addendum)
Pt reports constant vomiting since Friday.  Sts having bilateral flank pain.  Also sts no nausea right now.  Reports weakness.  Reports she hasn't urinated since 1200.  Sts she has been in renal failure before. Reports GI MD is Marin Comment and has appointment next month at [facility].

## 2013-12-25 NOTE — Discharge Instructions (Signed)
Hypokalemia Hypokalemia means that the amount of potassium in the blood is lower than normal.Potassium is a chemical, called an electrolyte, that helps regulate the amount of fluid in the body. It also stimulates muscle contraction and helps nerves function properly.Most of the body's potassium is inside of cells, and only a very small amount is in the blood. Because the amount in the blood is so small, minor changes can be life-threatening. CAUSES  Antibiotics.  Diarrhea or vomiting.  Using laxatives too much, which can cause diarrhea.  Chronic kidney disease.  Water pills (diuretics).  Eating disorders (bulimia).  Low magnesium level.  Sweating a lot. SIGNS AND SYMPTOMS  Weakness.  Constipation.  Fatigue.  Muscle cramps.  Mental confusion.  Skipped heartbeats or irregular heartbeat (palpitations).  Tingling or numbness. DIAGNOSIS  Your health care provider can diagnose hypokalemia with blood tests. In addition to checking your potassium level, your health care provider may also check other lab tests. TREATMENT Hypokalemia can be treated with potassium supplements taken by mouth or adjustments in your current medicines. If your potassium level is very low, you may need to get potassium through a vein (IV) and be monitored in the hospital. A diet high in potassium is also helpful. Foods high in potassium are:  Nuts, such as peanuts and pistachios.  Seeds, such as sunflower seeds and pumpkin seeds.  Peas, lentils, and lima beans.  Whole grain and bran cereals and breads.  Fresh fruit and vegetables, such as apricots, avocado, bananas, cantaloupe, kiwi, oranges, tomatoes, asparagus, and potatoes.  Orange and tomato juices.  Red meats.  Fruit yogurt. HOME CARE INSTRUCTIONS  Take all medicines as prescribed by your health care provider.  Maintain a healthy diet by including nutritious food, such as fruits, vegetables, nuts, whole grains, and lean meats.  If  you are taking a laxative, be sure to follow the directions on the label. SEEK MEDICAL CARE IF:  Your weakness gets worse.  You feel your heart pounding or racing.  You are vomiting or having diarrhea.  You are diabetic and having trouble keeping your blood glucose in the normal range. SEEK IMMEDIATE MEDICAL CARE IF:  You have chest pain, shortness of breath, or dizziness.  You are vomiting or having diarrhea for more than 2 days.  You faint. MAKE SURE YOU:   Understand these instructions.  Will watch your condition.  Will get help right away if you are not doing well or get worse. Document Released: 08/24/2005 Document Revised: 06/14/2013 Document Reviewed: 02/24/2013 [facility] Patient Information 2014 [place]. Dehydration, Adult Dehydration is when you lose more fluids from the body than you take in. Vital organs like the kidneys, brain, and heart cannot function without a proper amount of fluids and salt. Any loss of fluids from the body can cause dehydration.  CAUSES   Vomiting.  Diarrhea.  Excessive sweating.  Excessive urine output.  Fever. SYMPTOMS  Mild dehydration  Thirst.  Dry lips.  Slightly dry mouth. Moderate dehydration  Very dry mouth.  Sunken eyes.  Skin does not bounce back quickly when lightly pinched and released.  Dark urine and decreased urine production.  Decreased tear production.  Headache. Severe dehydration  Very dry mouth.  Extreme thirst.  Rapid, weak pulse (more than 100 beats per minute at rest).  Cold hands and feet.  Not able to sweat in spite of heat and temperature.  Rapid breathing.  Blue lips.  Confusion and lethargy.  Difficulty being awakened.  Minimal urine production.  No tears. DIAGNOSIS  Your caregiver will diagnose dehydration based on your symptoms and your exam. Blood and urine tests will help confirm the diagnosis. The diagnostic evaluation should also identify the cause of  dehydration. TREATMENT  Treatment of mild or moderate dehydration can often be done at home by increasing the amount of fluids that you drink. It is best to drink small amounts of fluid more often. Drinking too much at one time can make vomiting worse. Refer to the home care instructions below. Severe dehydration needs to be treated at the hospital where you will probably be given intravenous (IV) fluids that contain water and electrolytes. HOME CARE INSTRUCTIONS   Ask your caregiver about specific rehydration instructions.  Drink enough fluids to keep your urine clear or pale yellow.  Drink small amounts frequently if you have nausea and vomiting.  Eat as you normally do.  Avoid:  Foods or drinks high in sugar.  Carbonated drinks.  Juice.  Extremely hot or cold fluids.  Drinks with caffeine.  Fatty, greasy foods.  Alcohol.  Tobacco.  Overeating.  Gelatin desserts.  Wash your hands well to avoid spreading bacteria and viruses.  Only take over-the-counter or prescription medicines for pain, discomfort, or fever as directed by your caregiver.  Ask your caregiver if you should continue all prescribed and over-the-counter medicines.  Keep all follow-up appointments with your caregiver. SEEK MEDICAL CARE IF:  You have abdominal pain and it increases or stays in one area (localizes).  You have a rash, stiff neck, or severe headache.  You are irritable, sleepy, or difficult to awaken.  You are weak, dizzy, or extremely thirsty. SEEK IMMEDIATE MEDICAL CARE IF:   You are unable to keep fluids down or you get worse despite treatment.  You have frequent episodes of vomiting or diarrhea.  You have blood or green matter (bile) in your vomit.  You have blood in your stool or your stool looks black and tarry.  You have not urinated in 6 to 8 hours, or you have only urinated a small amount of very dark urine.  You have a fever.  You faint. MAKE SURE YOU:    Understand these instructions.  Will watch your condition.  Will get help right away if you are not doing well or get worse. Document Released: 08/24/2005 Document Revised: 11/16/2011 Document Reviewed: 04/13/2011 [facility] Patient Information 2014 [place], [place]. Cyclic Vomiting Syndrome Cyclic vomiting syndrome is a benign condition in which patients experience bouts or cycles of severe nausea and vomiting that last for hours or even days. The bouts of nausea and vomiting alternate with longer periods of no symptoms and generally good health. Cyclic vomiting syndrome occurs mostly in children, but can affect adults. CAUSES  [facility] has no known cause. Each episode is typically similar to the previous ones. The episodes tend to:   Start at about the same time of day.  Last the same length of time.  Present the same symptoms at the same level of intensity. Cyclic vomiting syndrome can begin at any age in children and adults. Cyclic vomiting syndrome usually starts between the ages of 3 and 7 years. In adults, episodes tend to occur less often than they do in children, but they last longer. Furthermore, the events or situations that trigger episodes in adults cannot always be pinpointed as easily as they can in children. There are 4 phases of cyclic vomiting syndrome: 1. Prodrome. The prodrome phase signals that an episode of nausea and vomiting is  about to begin. This phase can last from just a few minutes to several hours. This phase is often marked by belly (abdominal) pain. Sometimes taking medicine early in the prodrome phase can stop an episode in progress. However, sometimes there is no warning. A person may simply wake up in the middle of the night or early morning and begin vomiting. 2. Episode. The episode phase consists of:  Severe vomiting.  Nausea.  Gagging (retching). 3. Recovery. The recovery phase begins when the nausea and vomiting stop. Healthy color, appetite, and energy  return. 4. Symptom-free interval. The symptom-free interval phase is the period between episodes when no symptoms are present. TRIGGERS Episodes can be triggered by an infection or event. Examples of triggers include:  Infections.  Colds, allergies, sinus problems, and the flu.  Eating certain foods such as chocolate or cheese.  Foods with monosodium glutamate (MSG) or preservatives.  Fast foods.  Pre-packaged foods.  Foods with low nutritional value (junk foods).  Overeating.  Eating just before going to bed.  Hot weather.  Dehydration.  Not enough sleep or poor sleep quality.  Physical exhaustion.  Menstruation.  Motion sickness.  Emotional stress (school or home difficulties).  Excitement or stress. SYMPTOMS  The main symptoms of cyclic vomiting syndrome are:  Severe vomiting.  Nausea.  Gagging (retching). Episodes usually begin at night or the first thing in the morning. Episodes may include vomiting or retching up to 5 or 6 times an hour during the worst of the episode. Episodes usually last anywhere from 1 to 4 days. Episodes can last for up to 10 days. Other symptoms include:  Paleness.  Exhaustion.  Listlessness.  Abdominal pain.  Loose stools or diarrhea. Sometimes the nausea and vomiting are so severe that a person appears to be almost unconscious. Sensitivity to light, headache, fever, dizziness, may also accompany an episode. In addition, the vomiting may cause drooling and excessive thirst. Drinking water usually leads to more vomiting, though the water can dilute the acid in the vomit, making the episode a little less painful. Continuous vomiting can lead to dehydration, which means that the body has lost excessive water and salts. DIAGNOSIS  Cyclic vomiting syndrome is hard to diagnose because there are no clear tests to identify it. A caregiver must diagnose cyclic vomiting syndrome by looking at symptoms and medical history. A caregiver must  exclude more common diseases or disorders that can also cause nausea and vomiting. Also, diagnosis takes time because caregivers need to identify a pattern or cycle to the vomiting. TREATMENT  Cyclic vomiting syndrome cannot be cured. Treatment varies, but people with cyclic vomiting syndrome should get plenty of rest and sleep and take medications that prevent, stop, or lessen the vomiting episodes and other symptoms. People whose episodes are frequent and long-lasting may be treated during the symptom-free intervals in an effort to prevent or ease future episodes. The symptom-free phase is a good time to eliminate anything known to trigger an episode. For example, if episodes are brought on by stress or excitement, this period is the time to find ways to reduce stress and stay calm. If sinus problems or allergies cause episodes, those conditions should be treated. The triggers listed above should be avoided or prevented. Because of the similarities between migraine and cyclic vomiting syndrome, caregivers treat some people with severe cyclic vomiting syndrome with drugs that are also used for migraine headaches. The drugs are designed to:  Prevent episodes.  Reduce their frequency.  Lessen their  severity. HOME CARE INSTRUCTIONS Once a vomiting episode begins, treatment is supportive. It helps to stay in bed and sleep in a dark, quiet room. Severe nausea and vomiting may require hospitalization and intravenous (IV) fluids to prevent dehydration. Relaxing medications (sedatives) may help if the nausea continues. Sometimes, during the prodrome phase, it is possible to stop an episode from happening altogether. Only take over-the-counter or prescription medicines for pain, discomfort or fever as directed by your caregiver. Do not give aspirin to children. During the recovery phase, drinking water and replacing lost electrolytes (salts in the blood) are very important. Electrolytes are salts that the body  needs to function well and stay healthy. Symptoms during the recovery phase can vary. Some people find that their appetites return to normal immediately, while others need to begin by drinking clear liquids and then move slowly to solid food. RELATED COMPLICATIONS The severe vomiting that defines cyclic vomiting syndrome is a risk factor for several complications:  Dehydration Vomiting causes the body to lose water quickly.  Electrolyte imbalance Vomiting also causes the body to lose the important salts it needs to keep working properly.  Peptic esophagitis The tube that connects the mouth to the stomach (esophagus) becomes injured from the stomach acid that comes up with the vomit.  Hematemesis The esophagus becomes irritated and bleeds, so blood mixes with the vomit.  Mallory-Weiss tear The lower end of the esophagus may tear open or the stomach may bruise from vomiting or retching.  Tooth decay The acid in the vomit can hurt the teeth by corroding the tooth enamel. SEEK MEDICAL CARE IF: You have questions or problems. Document Released: 11/02/2001 Document Revised: 11/16/2011 Document Reviewed: 12/01/2010 [facility] Patient Information 2014 [place], [place].

## 2013-12-26 NOTE — ED Notes (Signed)
[facility] pharmacy called to clarify Potassium prescription.  Written for Potassium 20 meq tablets, sig: take 10 meq each day, dispense 30 tablets.  Reviewed with Dr. Jeanell Sparrow.  Order changed to Potassium 10 meq sig take one tablet per day, dispense 30 tablets.

## 2014-01-16 ENCOUNTER — Encounter (HOSPITAL_BASED_OUTPATIENT_CLINIC_OR_DEPARTMENT_OTHER): Payer: Self-pay | Admitting: Emergency Medicine

## 2014-01-16 ENCOUNTER — Emergency Department (HOSPITAL_BASED_OUTPATIENT_CLINIC_OR_DEPARTMENT_OTHER)
Admission: EM | Admit: 2014-01-16 | Discharge: 2014-01-17 | Disposition: A | Discharge status: Home / Self Care | Payer: Medicaid Other | Attending: Emergency Medicine | Admitting: Emergency Medicine

## 2014-01-16 DIAGNOSIS — Z79899 Other long term (current) drug therapy: Secondary | ICD-10-CM | POA: Insufficient documentation

## 2014-01-16 DIAGNOSIS — R5381 Other malaise: Secondary | ICD-10-CM | POA: Insufficient documentation

## 2014-01-16 DIAGNOSIS — R1115 Cyclical vomiting syndrome unrelated to migraine: Secondary | ICD-10-CM

## 2014-01-16 DIAGNOSIS — Z3202 Encounter for pregnancy test, result negative: Secondary | ICD-10-CM | POA: Insufficient documentation

## 2014-01-16 DIAGNOSIS — M549 Dorsalgia, unspecified: Secondary | ICD-10-CM | POA: Insufficient documentation

## 2014-01-16 DIAGNOSIS — R110 Nausea: Secondary | ICD-10-CM | POA: Insufficient documentation

## 2014-01-16 DIAGNOSIS — Z87448 Personal history of other diseases of urinary system: Secondary | ICD-10-CM | POA: Insufficient documentation

## 2014-01-16 DIAGNOSIS — R6883 Chills (without fever): Secondary | ICD-10-CM | POA: Insufficient documentation

## 2014-01-16 DIAGNOSIS — E876 Hypokalemia: Secondary | ICD-10-CM

## 2014-01-16 DIAGNOSIS — R5383 Other fatigue: Secondary | ICD-10-CM

## 2014-01-16 DIAGNOSIS — F17200 Nicotine dependence, unspecified, uncomplicated: Secondary | ICD-10-CM | POA: Insufficient documentation

## 2014-01-16 HISTORY — DX: Disorder of kidney and ureter, unspecified: N28.9

## 2014-01-16 LAB — COMPREHENSIVE METABOLIC PANEL
ALT: 12 U/L (ref 0–35)
AST: 20 U/L (ref 0–37)
Albumin: 4.7 g/dL (ref 3.5–5.2)
Alkaline Phosphatase: 59 U/L (ref 39–117)
BUN: 9 mg/dL (ref 6–23)
CO2: 28 mEq/L (ref 19–32)
Calcium: 10.1 mg/dL (ref 8.4–10.5)
Chloride: 85 mEq/L — ABNORMAL LOW (ref 96–112)
Creatinine, Ser: 0.6 mg/dL (ref 0.50–1.10)
GFR calc Af Amer: >90 mL/min (ref >90)
GFR calc non Af Amer: >90 mL/min (ref >90)
Glucose, Bld: 105 mg/dL — ABNORMAL HIGH (ref 70–99)
Potassium: 2.4 mEq/L — CL (ref 3.7–5.3)
Sodium: 131 mEq/L — ABNORMAL LOW (ref 137–147)
Total Bilirubin: 1.2 mg/dL (ref 0.3–1.2)
Total Protein: 7.8 g/dL (ref 6.0–8.3)

## 2014-01-16 LAB — CBC WITH DIFFERENTIAL/PLATELET
Basophils Absolute: 0 10*3/uL (ref 0.0–0.1)
Basophils Relative: 0 % (ref 0–1)
Eosinophils Absolute: 0 10*3/uL (ref 0.0–0.7)
Eosinophils Relative: 0 % (ref 0–5)
HCT: 41 % (ref 36.0–46.0)
Hemoglobin: 15.1 g/dL — ABNORMAL HIGH (ref 12.0–15.0)
Lymphocytes Relative: 9 % — ABNORMAL LOW (ref 12–46)
Lymphs Abs: 1.6 10*3/uL (ref 0.7–4.0)
MCH: 33.9 pg (ref 26.0–34.0)
MCHC: 36.8 g/dL — ABNORMAL HIGH (ref 30.0–36.0)
MCV: 92.1 fL (ref 78.0–100.0)
Monocytes Absolute: 1.3 10*3/uL — ABNORMAL HIGH (ref 0.1–1.0)
Monocytes Relative: 8 % (ref 3–12)
Neutro Abs: 14.6 10*3/uL — ABNORMAL HIGH (ref 1.7–7.7)
Neutrophils Relative %: 83 % — ABNORMAL HIGH (ref 43–77)
Platelets: 331 10*3/uL (ref 150–400)
RBC: 4.45 MIL/uL (ref 3.87–5.11)
RDW: 13 % (ref 11.5–15.5)
WBC: 17.6 10*3/uL — ABNORMAL HIGH (ref 4.0–10.5)

## 2014-01-16 LAB — URINALYSIS, ROUTINE W REFLEX MICROSCOPIC
Bilirubin Urine: NEGATIVE
Glucose, UA: NEGATIVE mg/dL
Hgb urine dipstick: NEGATIVE
Ketones, ur: 40 mg/dL — AB
Leukocytes, UA: NEGATIVE
Nitrite: NEGATIVE
Protein, ur: NEGATIVE mg/dL
Specific Gravity, Urine: 1.005 (ref 1.005–1.030)
Urobilinogen, UA: 0.2 mg/dL (ref 0.0–1.0)
pH: 7.5 (ref 5.0–8.0)

## 2014-01-16 LAB — LIPASE, BLOOD: Lipase: 16 U/L (ref 11–59)

## 2014-01-16 LAB — PREGNANCY, URINE: Preg Test, Ur: NEGATIVE

## 2014-01-16 MED ORDER — HYDROMORPHONE HCL PF 1 MG/ML IJ SOLN
1.0000 mg | INTRAMUSCULAR | Status: DC | PRN
  Administered 2014-01-16 (×2): 1 mg via INTRAVENOUS
  Filled 2014-01-16 (×2): qty 1

## 2014-01-16 MED ORDER — SODIUM CHLORIDE 0.9 % IV SOLN
1000.0000 mL | INTRAVENOUS | Status: DC
  Administered 2014-01-16: 1000 mL via INTRAVENOUS

## 2014-01-16 MED ORDER — SODIUM CHLORIDE 0.9 % IV SOLN
1000.0000 mL | Freq: Once | INTRAVENOUS | Status: AC
  Administered 2014-01-16: 1000 mL via INTRAVENOUS

## 2014-01-16 MED ORDER — ONDANSETRON HCL 4 MG/2ML IJ SOLN
4.0000 mg | Freq: Once | INTRAMUSCULAR | Status: AC
  Administered 2014-01-16: 4 mg via INTRAVENOUS
  Filled 2014-01-16: qty 2

## 2014-01-16 MED ORDER — POTASSIUM CHLORIDE CRYS ER 20 MEQ PO TBCR
40.0000 meq | EXTENDED_RELEASE_TABLET | Freq: Once | ORAL | Status: AC
  Administered 2014-01-16: 40 meq via ORAL
  Filled 2014-01-16: qty 2

## 2014-01-16 MED ORDER — POTASSIUM CHLORIDE 10 MEQ/100ML IV SOLN
10.0000 meq | INTRAVENOUS | Status: AC
  Administered 2014-01-16 (×2): 10 meq via INTRAVENOUS
  Filled 2014-01-16 (×2): qty 100

## 2014-01-16 MED ORDER — POTASSIUM CHLORIDE ER 20 MEQ PO TBCR: 20.0000 meq | EXTENDED_RELEASE_TABLET | Freq: Every day | ORAL | Status: DC

## 2014-01-16 NOTE — ED Notes (Signed)
MD at bedside to update pt in plan of care.

## 2014-01-16 NOTE — ED Notes (Signed)
Pt she has cyclic vomiting syndrome. Been vomiting x 24 hours. Unable to keep fluid down. Unable to take her oral antiemetic. Refuses to get undress. Unable to get a urine sample unless "you guys give me IV fluid". Self-report that she is here every month for same. Has appoint soon with [facility] with an specialist for this syndrome. Pt is alert, oriented. NAD noted. MD in room

## 2014-01-16 NOTE — ED Notes (Signed)
Pt refuses to get undressed. States we know her here and do not to exam her to give her IV's.

## 2014-01-16 NOTE — Discharge Instructions (Signed)
Cyclic Vomiting Syndrome °Cyclic vomiting syndrome is a benign condition in which patients experience bouts or cycles of severe nausea and vomiting that last for hours or even days. The bouts of nausea and vomiting alternate with longer periods of no symptoms and generally good health. Cyclic vomiting syndrome occurs mostly in children, but can affect adults. °CAUSES  °CVS has no known cause. Each episode is typically similar to the previous ones. The episodes tend to:  °· Start at about the same time of day. °· Last the same length of time. °· Present the same symptoms at the same level of intensity. °Cyclic vomiting syndrome can begin at any age in children and adults. Cyclic vomiting syndrome usually starts between the ages of 3 and 7 years. In adults, episodes tend to occur less often than they do in children, but they last longer. Furthermore, the events or situations that trigger episodes in adults cannot always be pinpointed as easily as they can in children. °There are 4 phases of cyclic vomiting syndrome: °1. Prodrome. The prodrome phase signals that an episode of nausea and vomiting is about to begin. This phase can last from just a few minutes to several hours. This phase is often marked by belly (abdominal) pain. Sometimes taking medicine early in the prodrome phase can stop an episode in progress. However, sometimes there is no warning. A person may simply wake up in the middle of the night or early morning and begin vomiting. °2. Episode. The episode phase consists of: °· Severe vomiting. °· Nausea. °· Gagging (retching). °3. Recovery. The recovery phase begins when the nausea and vomiting stop. Healthy color, appetite, and energy return. °4. Symptom-free interval. The symptom-free interval phase is the period between episodes when no symptoms are present. °TRIGGERS °Episodes can be triggered by an infection or event. Examples of triggers include: °· Infections. °· Colds, allergies, sinus problems, and  the flu. °· Eating certain foods such as chocolate or cheese. °· Foods with monosodium glutamate (MSG) or preservatives. °· Fast foods. °· Pre-packaged foods. °· Foods with low nutritional value (junk foods). °· Overeating. °· Eating just before going to bed. °· Hot weather. °· Dehydration. °· Not enough sleep or poor sleep quality. °· Physical exhaustion. °· Menstruation. °· Motion sickness. °· Emotional stress (school or home difficulties). °· Excitement or stress. °SYMPTOMS  °The main symptoms of cyclic vomiting syndrome are: °· Severe vomiting. °· Nausea. °· Gagging (retching). °Episodes usually begin at night or the first thing in the morning. Episodes may include vomiting or retching up to 5 or 6 times an hour during the worst of the episode. Episodes usually last anywhere from 1 to 4 days. Episodes can last for up to 10 days. Other symptoms include: °· Paleness. °· Exhaustion. °· Listlessness. °· Abdominal pain. °· Loose stools or diarrhea. °Sometimes the nausea and vomiting are so severe that a person appears to be almost unconscious. Sensitivity to light, headache, fever, dizziness, may also accompany an episode. In addition, the vomiting may cause drooling and excessive thirst. Drinking water usually leads to more vomiting, though the water can dilute the acid in the vomit, making the episode a little less painful. Continuous vomiting can lead to dehydration, which means that the body has lost excessive water and salts. °DIAGNOSIS  °Cyclic vomiting syndrome is hard to diagnose because there are no clear tests to identify it. A caregiver must diagnose cyclic vomiting syndrome by looking at symptoms and medical history. A caregiver must exclude more common diseases   or disorders that can also cause nausea and vomiting. Also, diagnosis takes time because caregivers need to identify a pattern or cycle to the vomiting. TREATMENT  Cyclic vomiting syndrome cannot be cured. Treatment varies, but people with  cyclic vomiting syndrome should get plenty of rest and sleep and take medications that prevent, stop, or lessen the vomiting episodes and other symptoms. People whose episodes are frequent and long-lasting may be treated during the symptom-free intervals in an effort to prevent or ease future episodes. The symptom-free phase is a good time to eliminate anything known to trigger an episode. For example, if episodes are brought on by stress or excitement, this period is the time to find ways to reduce stress and stay calm. If sinus problems or allergies cause episodes, those conditions should be treated. The triggers listed above should be avoided or prevented. Because of the similarities between migraine and cyclic vomiting syndrome, caregivers treat some people with severe cyclic vomiting syndrome with drugs that are also used for migraine headaches. The drugs are designed to:  Prevent episodes.  Reduce their frequency.  Lessen their severity. HOME CARE INSTRUCTIONS Once a vomiting episode begins, treatment is supportive. It helps to stay in bed and sleep in a dark, quiet room. Severe nausea and vomiting may require hospitalization and intravenous (IV) fluids to prevent dehydration. Relaxing medications (sedatives) may help if the nausea continues. Sometimes, during the prodrome phase, it is possible to stop an episode from happening altogether. Only take over-the-counter or prescription medicines for pain, discomfort or fever as directed by your caregiver. Do not give aspirin to children. During the recovery phase, drinking water and replacing lost electrolytes (salts in the blood) are very important. Electrolytes are salts that the body needs to function well and stay healthy. Symptoms during the recovery phase can vary. Some people find that their appetites return to normal immediately, while others need to begin by drinking clear liquids and then move slowly to solid food. RELATED COMPLICATIONS The  severe vomiting that defines cyclic vomiting syndrome is a risk factor for several complications:  Dehydration Vomiting causes the body to lose water quickly.  Electrolyte imbalance Vomiting also causes the body to lose the important salts it needs to keep working properly.  Peptic esophagitis The tube that connects the mouth to the stomach (esophagus) becomes injured from the stomach acid that comes up with the vomit.  Hematemesis The esophagus becomes irritated and bleeds, so blood mixes with the vomit.  Mallory-Weiss tear The lower end of the esophagus may tear open or the stomach may bruise from vomiting or retching.  Tooth decay The acid in the vomit can hurt the teeth by corroding the tooth enamel. SEEK MEDICAL CARE IF: You have questions or problems. Document Released: 11/02/2001 Document Revised: 11/16/2011 Document Reviewed: 12/01/2010 [facility] Patient Information 2014 [place], [place].  Hypokalemia Hypokalemia means that the amount of potassium in the blood is lower than normal.Potassium is a chemical, called an electrolyte, that helps regulate the amount of fluid in the body. It also stimulates muscle contraction and helps nerves function properly.Most of the body's potassium is inside of cells, and only a very small amount is in the blood. Because the amount in the blood is so small, minor changes can be life-threatening. CAUSES  Antibiotics.  Diarrhea or vomiting.  Using laxatives too much, which can cause diarrhea.  Chronic kidney disease.  Water pills (diuretics).  Eating disorders (bulimia).  Low magnesium level.  Sweating a lot. SIGNS AND SYMPTOMS  Weakness.  Constipation.  Fatigue.  Muscle cramps.  Mental confusion.  Skipped heartbeats or irregular heartbeat (palpitations).  Tingling or numbness. DIAGNOSIS  Your health care provider can diagnose hypokalemia with blood tests. In addition to checking your potassium level, your health care provider  may also check other lab tests. TREATMENT Hypokalemia can be treated with potassium supplements taken by mouth or adjustments in your current medicines. If your potassium level is very low, you may need to get potassium through a vein (IV) and be monitored in the hospital. A diet high in potassium is also helpful. Foods high in potassium are:  Nuts, such as peanuts and pistachios.  Seeds, such as sunflower seeds and pumpkin seeds.  Peas, lentils, and lima beans.  Whole grain and bran cereals and breads.  Fresh fruit and vegetables, such as apricots, avocado, bananas, cantaloupe, kiwi, oranges, tomatoes, asparagus, and potatoes.  Orange and tomato juices.  Red meats.  Fruit yogurt. HOME CARE INSTRUCTIONS  Take all medicines as prescribed by your health care provider.  Maintain a healthy diet by including nutritious food, such as fruits, vegetables, nuts, whole grains, and lean meats.  If you are taking a laxative, be sure to follow the directions on the label. SEEK MEDICAL CARE IF:  Your weakness gets worse.  You feel your heart pounding or racing.  You are vomiting or having diarrhea.  You are diabetic and having trouble keeping your blood glucose in the normal range. SEEK IMMEDIATE MEDICAL CARE IF:  You have chest pain, shortness of breath, or dizziness.  You are vomiting or having diarrhea for more than 2 days.  You faint. MAKE SURE YOU:   Understand these instructions.  Will watch your condition.  Will get help right away if you are not doing well or get worse. Document Released: 08/24/2005 Document Revised: 06/14/2013 Document Reviewed: 02/24/2013 [facility] Patient Information 2014 [place].

## 2014-01-16 NOTE — ED Notes (Signed)
Pt ambulatory to restroom

## 2014-01-16 NOTE — ED Provider Notes (Signed)
CSN: [id number]     Arrival date & time 01/16/14  2007 History  This chart was scribed for Kathalene Frames, MD by Zettie Pho, ED Scribe. This patient was seen in room MH12/MH12 and the patient's care was started at 8:24 PM.    Chief Complaint  Patient presents with  . Abdominal Pain   The history is provided by the patient. No language interpreter was used.   HPI Comments: Natalie Joseph is a 33 y.o. female with a history of cyclic vomiting syndrome (per patient) who presents to the Emergency Department complaining of an intermittent pain to the generalized abdomen with associated nausea, multiple episodes of emesis, lower back pain, chills, and fatigue onset yesterday. She reports noticing some streaking of bright red blood in her emesis. Patient is prescribed Zofran, Reglan, and Phenergan, but without significant relief of her current symptoms. Patient was seen here about 3 weeks ago on 12/25/2013 for similar complaints and refused hospital admission, so she was discharged with potassium chloride. Patient states that she is still taking the potassium chloride, but has not been able to tolerate it over the past 24 hours. She denies diarrhea, urinary symptoms, vaginal bleeding or discharge. She denies history of abdominal surgeries. Patient has allergies to Septra DS and sulfa antibiotics. Patient has no other pertinent medical history.   GI- Dr. Truman Hayward PCP- Dr. Daylene Posey.   Past Medical History  Diagnosis Date  . Renal disorder    History reviewed. No pertinent past surgical history. Family History  Problem Relation Age of Onset  . Hypertension Mother   . Hyperlipidemia Mother   . Kidney disease Father   . Hypertension Father   . Depression Father   . Diabetes Father   . Heart disease Father   . Cancer Paternal Aunt     breast  . Diabetes Paternal Aunt   . Stroke Maternal Grandmother   . Cancer Maternal Grandfather     lung  . Stroke Paternal Grandmother   . Heart disease  Paternal Grandfather    History  Substance Use Topics  . Smoking status: Current Every Day Smoker -- 1.50 packs/day for 17 years  . Smokeless tobacco: Not on file  . Alcohol Use: No   OB History   Grav Para Term Preterm Abortions TAB SAB Ect Mult Living                 Review of Systems  Constitutional: Positive for chills and fatigue.  Gastrointestinal: Positive for nausea, vomiting and abdominal pain.  Genitourinary: Negative for vaginal bleeding, vaginal discharge and difficulty urinating.  Musculoskeletal: Positive for back pain.  All other systems reviewed and are negative.     Allergies  Septra ds and Sulfa antibiotics  Home Medications   Prior to Admission medications   Medication Sig Start Date End Date Taking? Authorizing Provider  ALPRAZolam (XANAX) 0.25 MG tablet Take 0.25 mg by mouth 2 (two) times daily.     Daylene Posey, FNP  cyclobenzaprine (FLEXERIL) 10 MG tablet Take 10 mg by mouth 3 (three) times daily as needed for muscle spasms.    Daylene Posey, FNP  ibuprofen (ADVIL,MOTRIN) 200 MG tablet Take 800 mg by mouth every 6 (six) hours as needed. For pain     Historical Provider, MD  ondansetron (ZOFRAN) 4 MG tablet Take 4 mg by mouth once.     Daylene Posey, FNP  potassium chloride 20 MEQ TBCR Take 10 mEq by mouth daily. 12/25/13   Barbette Hair  Horton, MD   Triage Vitals: BP 147/84  Pulse 107  Temp(Src) 98.2 F (36.8 C) (Oral)  Resp 20  Ht 5\' 4"  (1.626 m)  Wt 125 lb (56.7 kg)  BMI 21.45 kg/m2  SpO2 98%  LMP 12/11/2013  Physical Exam  Nursing note and vitals reviewed. Constitutional: She appears well-developed and well-nourished. No distress.  HENT:  Head: Normocephalic and atraumatic.  Right Ear: External ear normal.  Left Ear: External ear normal.  Eyes: Conjunctivae are normal. Right eye exhibits no discharge. Left eye exhibits no discharge. No scleral icterus.  Neck: Neck supple. No tracheal deviation present.  Cardiovascular: Normal rate,  regular rhythm and intact distal pulses.   Pulmonary/Chest: Effort normal and breath sounds normal. No stridor. No respiratory distress. She has no wheezes. She has no rales.  Abdominal: Soft. Bowel sounds are normal. She exhibits no distension. There is tenderness. There is no rebound and no guarding.  Tenderness to the periumbilical region.   Musculoskeletal: She exhibits no edema and no tenderness.  Neurological: She is alert. She has normal strength. No cranial nerve deficit (no facial droop, extraocular movements intact, no slurred speech) or sensory deficit. She exhibits normal muscle tone. She displays no seizure activity. Coordination normal.  Skin: Skin is warm and dry. No rash noted.  Psychiatric: She has a normal mood and affect.    ED Course  Procedures (including critical care time)  DIAGNOSTIC STUDIES: Oxygen Saturation is 98% on room air, normal by my interpretation.    COORDINATION OF CARE: 8:16 PM- Ordered UA and urine pregnancy.   8:30 PM- Ordered CBC, CMP, lipase. Ordered IV fluids, Zofran, and Dilaudid to manage symptoms. Discussed treatment plan with patient at bedside and patient verbalized agreement.   9:38 PM- Patient reports feeling much better after receiving the medications and is tolerated liquids well while in the ED. Discussed abnormal lab results. Ordered potassium chloride to manage symptoms. Discussed treatment plan with patient at bedside and patient verbalized agreement.   Labs Review Labs Reviewed  URINALYSIS, ROUTINE W REFLEX MICROSCOPIC - Abnormal; Notable for the following:    Ketones, ur 40 (*)    All other components within normal limits  CBC WITH DIFFERENTIAL - Abnormal; Notable for the following:    WBC 17.6 (*)    Hemoglobin 15.1 (*)    MCHC 36.8 (*)    Neutrophils Relative % 83 (*)    Neutro Abs 14.6 (*)    Lymphocytes Relative 9 (*)    Monocytes Absolute 1.3 (*)    All other components within normal limits  COMPREHENSIVE METABOLIC  PANEL - Abnormal; Notable for the following:    Sodium 131 (*)    Potassium 2.4 (*)    Chloride 85 (*)    Glucose, Bld 105 (*)    All other components within normal limits  PREGNANCY, URINE  LIPASE, BLOOD     EKG Interpretation   Date/Time:  Tuesday Jan 16 2014 21:40:39 EDT Ventricular Rate:  89 PR Interval:  152 QRS Duration: 78 QT Interval:  392 QTC Calculation: 476 R Axis:   77 Text Interpretation:  Sinus rhythm Sinus arrhythmia No previous tracing  Confirmed by Cuma Polyakov  MD-J, Clydene Burack ([id number]) on 01/16/2014 9:46:34 PM     Medications  0.9 %  sodium chloride infusion (0 mLs Intravenous Stopped 01/16/14 2125)    Followed by  0.9 %  sodium chloride infusion (1,000 mLs Intravenous New Bag/Given 01/16/14 2125)  HYDROmorphone (DILAUDID) injection 1 mg (1 mg Intravenous Given 01/16/14  2128)  potassium chloride 10 mEq in 100 mL IVPB (10 mEq Intravenous New Bag/Given 01/16/14 2237)  ondansetron (ZOFRAN) injection 4 mg (4 mg Intravenous Given 01/16/14 2043)  potassium chloride SA (K-DUR,KLOR-CON) CR tablet 40 mEq (40 mEq Oral Given 01/16/14 2134)  ondansetron (ZOFRAN) injection 4 mg (4 mg Intravenous Given 01/16/14 2158)    MDM   Final diagnoses:  Cyclic vomiting syndrome  Hypokalemia    Pt improved with pain medications, IV fluids.  NO further episodes of vomiting.  Tolerating po fluids.  Significant hypokalemia.  Treated with IV potassium.  Previously patient responded to similar treatment in the ED.  Will dc home with oral replacement.  Follow up with PCP I personally performed the services described in this documentation, which was scribed in my presence.  The recorded information has been reviewed and is accurate.     Kathalene Frames, MD 01/16/14 [phone]

## 2014-01-16 NOTE — ED Notes (Signed)
Abdominal pain, back pain, vomiting and chills since yesterday.

## 2014-01-17 NOTE — ED Notes (Signed)
SODIUM CHLORIDE FLUID INFUSION STOPPED (unable to chart d/t error code - locked charting).

## 2014-03-05 ENCOUNTER — Ambulatory Visit (INDEPENDENT_AMBULATORY_CARE_PROVIDER_SITE_OTHER): Payer: Medicaid Other | Admitting: Obstetrics & Gynecology

## 2014-03-05 ENCOUNTER — Encounter: Payer: Self-pay | Admitting: Obstetrics & Gynecology

## 2014-03-05 VITALS — BP 131/76 | HR 97 | Temp 99.0°F | Wt 121.0 lb

## 2014-03-05 DIAGNOSIS — Z113 Encounter for screening for infections with a predominantly sexual mode of transmission: Secondary | ICD-10-CM

## 2014-03-05 NOTE — Progress Notes (Signed)
Subjective:    Natalie Joseph is a 33 y.o. female who presents for sexually transmitted disease check. Sexual history reviewed with the patient. STI Exposure: denies knowledge of risky exposure.  Current symptoms none. Contraception: OCP (estrogen/progesterone) Menstrual History: OB History   Grav Para Term Preterm Abortions TAB SAB Ect Mult Living                   No LMP recorded.    Past Medical History  Diagnosis Date  . Renal disorder   . Cyclic vomiting syndrome     History reviewed. No pertinent past surgical history.  Current Outpatient Prescriptions  Medication Sig Dispense Refill  . ALPRAZolam (XANAX) 0.25 MG tablet Take 0.25 mg by mouth 2 (two) times daily.       . cyclobenzaprine (FLEXERIL) 10 MG tablet Take 10 mg by mouth 3 (three) times daily as needed for muscle spasms.      Marland Kitchen ibuprofen (ADVIL,MOTRIN) 200 MG tablet Take 800 mg by mouth every 6 (six) hours as needed. For pain       . metoCLOPramide (REGLAN) 10 MG tablet Take 10 mg by mouth 4 (four) times daily.      . ondansetron (ZOFRAN) 4 MG tablet Take 4 mg by mouth once.       . promethazine (PHENERGAN) 12.5 MG tablet Take 12.5 mg by mouth every 6 (six) hours as needed for nausea or vomiting.      . Potassium Chloride ER 20 MEQ TBCR Take 20 mEq by mouth daily.  30 tablet  0   No current facility-administered medications for this visit.   Allergies  Allergen Reactions  . Septra Ds [Sulfamethoxazole W/Trimethoprim (Co-Trimoxazole)] Nausea And Vomiting  . Sulfa Antibiotics Nausea And Vomiting    History  Substance Use Topics  . Smoking status: Current Every Day Smoker -- 1.50 packs/day for 17 years  . Smokeless tobacco: Not on file  . Alcohol Use: No    Family History  Problem Relation Age of Onset  . Hypertension Mother   . Hyperlipidemia Mother   . Kidney disease Father   . Hypertension Father   . Depression Father   . Diabetes Father   . Heart disease Father   . Cancer Paternal Aunt     breast   . Diabetes Paternal Aunt   . Stroke Maternal Grandmother   . Cancer Maternal Grandfather     lung  . Stroke Paternal Grandmother   . Heart disease Paternal Grandfather       Review of Systems Constitutional: negative for fevers Gastrointestinal: negative for abdominal pain Genitourinary:negative for abnormal menstrual periods, genital lesions, sexual problems and vaginal discharge and dysuria    Objective:    BP 131/76  Pulse 97  Temp(Src) 99 F (37.2 C)  Wt 54.885 kg (121 lb) General:   alert  Skin:   no rash or abnormalities  Lungs:   clear to auscultation bilaterally  Heart:   regular rate and rhythm, S1, S2 normal, no murmur, click, rub or gallop  Abdomen:  normal findings: no organomegaly, soft, non-tender and no hernia  Pelvis:  External genitalia: normal general appearance Urinary system: urethral meatus normal and bladder without fullness, nontender Vaginal: normal without tenderness, induration or masses Cervix: normal appearance Adnexa: normal bimanual exam Uterus: anteverted and non-tender, normal size       Lab Review  Labs reviewed yes Radiologic studies reviewed no   Assessment:     STI screening  Plan:   Orders Placed This Encounter  Procedures  . WET PREP BY MOLECULAR PROBE  . GC/Chlamydia Probe Amp  . HIV antibody  . RPR  . HSV(herpes simplex vrs) 1+2 ab-IgG    Follow up as needed.

## 2014-03-06 LAB — WET PREP BY MOLECULAR PROBE
Candida species: NEGATIVE
Gardnerella vaginalis: NEGATIVE
Trichomonas vaginosis: NEGATIVE

## 2014-03-06 LAB — RPR

## 2014-03-06 LAB — GC/CHLAMYDIA PROBE AMP
CT Probe RNA: NEGATIVE
GC Probe RNA: NEGATIVE

## 2014-03-06 LAB — HSV(HERPES SIMPLEX VRS) I + II AB-IGG
HSV 1 Glycoprotein G Ab, IgG: 0.85 IV
HSV 2 Glycoprotein G Ab, IgG: <0.1 IV

## 2014-03-06 LAB — HIV ANTIBODY (ROUTINE TESTING W REFLEX): HIV 1&2 Ab, 4th Generation: NONREACTIVE

## 2014-03-23 ENCOUNTER — Encounter: Payer: Self-pay | Admitting: *Deleted

## 2014-05-03 ENCOUNTER — Encounter (HOSPITAL_BASED_OUTPATIENT_CLINIC_OR_DEPARTMENT_OTHER): Payer: Self-pay | Admitting: Emergency Medicine

## 2014-05-03 ENCOUNTER — Emergency Department (HOSPITAL_BASED_OUTPATIENT_CLINIC_OR_DEPARTMENT_OTHER)
Admission: EM | Admit: 2014-05-03 | Discharge: 2014-05-03 | Disposition: A | Discharge status: Home / Self Care | Payer: Medicaid Other | Attending: Emergency Medicine | Admitting: Emergency Medicine

## 2014-05-03 DIAGNOSIS — R112 Nausea with vomiting, unspecified: Secondary | ICD-10-CM | POA: Insufficient documentation

## 2014-05-03 DIAGNOSIS — R109 Unspecified abdominal pain: Secondary | ICD-10-CM | POA: Insufficient documentation

## 2014-05-03 DIAGNOSIS — Z87448 Personal history of other diseases of urinary system: Secondary | ICD-10-CM | POA: Diagnosis not present

## 2014-05-03 DIAGNOSIS — Z3202 Encounter for pregnancy test, result negative: Secondary | ICD-10-CM | POA: Diagnosis not present

## 2014-05-03 DIAGNOSIS — Z791 Long term (current) use of non-steroidal anti-inflammatories (NSAID): Secondary | ICD-10-CM | POA: Insufficient documentation

## 2014-05-03 DIAGNOSIS — E876 Hypokalemia: Secondary | ICD-10-CM

## 2014-05-03 DIAGNOSIS — F17200 Nicotine dependence, unspecified, uncomplicated: Secondary | ICD-10-CM | POA: Diagnosis not present

## 2014-05-03 DIAGNOSIS — R1115 Cyclical vomiting syndrome unrelated to migraine: Secondary | ICD-10-CM | POA: Insufficient documentation

## 2014-05-03 DIAGNOSIS — Z79899 Other long term (current) drug therapy: Secondary | ICD-10-CM | POA: Insufficient documentation

## 2014-05-03 LAB — URINALYSIS, ROUTINE W REFLEX MICROSCOPIC
Bilirubin Urine: NEGATIVE
Glucose, UA: NEGATIVE mg/dL
Hgb urine dipstick: NEGATIVE
Ketones, ur: NEGATIVE mg/dL
Leukocytes, UA: NEGATIVE
Nitrite: NEGATIVE
Protein, ur: 30 mg/dL — AB
Specific Gravity, Urine: 1.012 (ref 1.005–1.030)
Urobilinogen, UA: 0.2 mg/dL (ref 0.0–1.0)
pH: 7.5 (ref 5.0–8.0)

## 2014-05-03 LAB — BASIC METABOLIC PANEL
Anion gap: 16 — ABNORMAL HIGH (ref 5–15)
BUN: 19 mg/dL (ref 6–23)
CO2: 31 mEq/L (ref 19–32)
Calcium: 10.5 mg/dL (ref 8.4–10.5)
Chloride: 86 mEq/L — ABNORMAL LOW (ref 96–112)
Creatinine, Ser: 0.8 mg/dL (ref 0.50–1.10)
GFR calc Af Amer: >90 mL/min (ref >90)
GFR calc non Af Amer: >90 mL/min (ref >90)
Glucose, Bld: 112 mg/dL — ABNORMAL HIGH (ref 70–99)
Potassium: 2.9 mEq/L — CL (ref 3.7–5.3)
Sodium: 133 mEq/L — ABNORMAL LOW (ref 137–147)

## 2014-05-03 LAB — URINE MICROSCOPIC-ADD ON

## 2014-05-03 LAB — CBC WITH DIFFERENTIAL/PLATELET
Basophils Absolute: 0 10*3/uL (ref 0.0–0.1)
Basophils Relative: 0 % (ref 0–1)
Eosinophils Absolute: 0 10*3/uL (ref 0.0–0.7)
Eosinophils Relative: 0 % (ref 0–5)
HCT: 43.8 % (ref 36.0–46.0)
Hemoglobin: 15.6 g/dL — ABNORMAL HIGH (ref 12.0–15.0)
Lymphocytes Relative: 12 % (ref 12–46)
Lymphs Abs: 2 10*3/uL (ref 0.7–4.0)
MCH: 32.9 pg (ref 26.0–34.0)
MCHC: 35.6 g/dL (ref 30.0–36.0)
MCV: 92.4 fL (ref 78.0–100.0)
Monocytes Absolute: 1.5 10*3/uL — ABNORMAL HIGH (ref 0.1–1.0)
Monocytes Relative: 9 % (ref 3–12)
Neutro Abs: 13.5 10*3/uL — ABNORMAL HIGH (ref 1.7–7.7)
Neutrophils Relative %: 79 % — ABNORMAL HIGH (ref 43–77)
Platelets: 357 10*3/uL (ref 150–400)
RBC: 4.74 MIL/uL (ref 3.87–5.11)
RDW: 12.4 % (ref 11.5–15.5)
WBC: 17 10*3/uL — ABNORMAL HIGH (ref 4.0–10.5)

## 2014-05-03 LAB — PREGNANCY, URINE: Preg Test, Ur: NEGATIVE

## 2014-05-03 MED ORDER — ONDANSETRON HCL 4 MG/2ML IJ SOLN
4.0000 mg | Freq: Once | INTRAMUSCULAR | Status: AC
  Administered 2014-05-03: 4 mg via INTRAMUSCULAR
  Filled 2014-05-03: qty 2

## 2014-05-03 MED ORDER — HYDROMORPHONE HCL PF 1 MG/ML IJ SOLN
1.0000 mg | Freq: Once | INTRAMUSCULAR | Status: AC
  Administered 2014-05-03: 1 mg via INTRAVENOUS
  Filled 2014-05-03: qty 1

## 2014-05-03 MED ORDER — SODIUM CHLORIDE 0.9 % IV BOLUS (SEPSIS)
1000.0000 mL | Freq: Once | INTRAVENOUS | Status: AC
  Administered 2014-05-03: 1000 mL via INTRAVENOUS

## 2014-05-03 MED ORDER — POTASSIUM CHLORIDE CRYS ER 20 MEQ PO TBCR
40.0000 meq | EXTENDED_RELEASE_TABLET | Freq: Once | ORAL | Status: AC
  Administered 2014-05-03: 40 meq via ORAL
  Filled 2014-05-03: qty 2

## 2014-05-03 MED ORDER — SODIUM CHLORIDE 0.9 % IV BOLUS (SEPSIS): 1000.0000 mL | Freq: Once | INTRAVENOUS | Status: DC

## 2014-05-03 NOTE — ED Provider Notes (Signed)
Medical screening examination/treatment/procedure(s) were performed by non-physician practitioner and as supervising physician I was immediately available for consultation/collaboration.   EKG Interpretation None        Malvin Johns, MD 05/03/14 1352

## 2014-05-03 NOTE — Discharge Instructions (Signed)
Hypokalemia Hypokalemia means that the amount of potassium in the blood is lower than normal.Potassium is a chemical, called an electrolyte, that helps regulate the amount of fluid in the body. It also stimulates muscle contraction and helps nerves function properly.Most of the body's potassium is inside of cells, and only a very small amount is in the blood. Because the amount in the blood is so small, minor changes can be life-threatening. CAUSES  Antibiotics.  Diarrhea or vomiting.  Using laxatives too much, which can cause diarrhea.  Chronic kidney disease.  Water pills (diuretics).  Eating disorders (bulimia).  Low magnesium level.  Sweating a lot. SIGNS AND SYMPTOMS  Weakness.  Constipation.  Fatigue.  Muscle cramps.  Mental confusion.  Skipped heartbeats or irregular heartbeat (palpitations).  Tingling or numbness. DIAGNOSIS  Your health care provider can diagnose hypokalemia with blood tests. In addition to checking your potassium level, your health care provider may also check other lab tests. TREATMENT Hypokalemia can be treated with potassium supplements taken by mouth or adjustments in your current medicines. If your potassium level is very low, you may need to get potassium through a vein (IV) and be monitored in the hospital. A diet high in potassium is also helpful. Foods high in potassium are:  Nuts, such as peanuts and pistachios.  Seeds, such as sunflower seeds and pumpkin seeds.  Peas, lentils, and lima beans.  Whole grain and bran cereals and breads.  Fresh fruit and vegetables, such as apricots, avocado, bananas, cantaloupe, kiwi, oranges, tomatoes, asparagus, and potatoes.  Orange and tomato juices.  Red meats.  Fruit yogurt. HOME CARE INSTRUCTIONS  Take all medicines as prescribed by your health care provider.  Maintain a healthy diet by including nutritious food, such as fruits, vegetables, nuts, whole grains, and lean meats.  If  you are taking a laxative, be sure to follow the directions on the label. SEEK MEDICAL CARE IF:  Your weakness gets worse.  You feel your heart pounding or racing.  You are vomiting or having diarrhea.  You are diabetic and having trouble keeping your blood glucose in the normal range. SEEK IMMEDIATE MEDICAL CARE IF:  You have chest pain, shortness of breath, or dizziness.  You are vomiting or having diarrhea for more than 2 days.  You faint. MAKE SURE YOU:   Understand these instructions.  Will watch your condition.  Will get help right away if you are not doing well or get worse. Document Released: 08/24/2005 Document Revised: 06/14/2013 Document Reviewed: 02/24/2013 [facility] Patient Information 2015 [place], [place]. This information is not intended to replace advice given to you by your health care provider. Make sure you discuss any questions you have with your health care provider.  Cyclic Vomiting Syndrome Cyclic vomiting syndrome is a benign condition in which patients experience bouts or cycles of severe nausea and vomiting that last for hours or even days. The bouts of nausea and vomiting alternate with longer periods of no symptoms and generally good health. Cyclic vomiting syndrome occurs mostly in children, but can affect adults. CAUSES  [facility] has no known cause. Each episode is typically similar to the previous ones. The episodes tend to:   Start at about the same time of day.  Last the same length of time.  Present the same symptoms at the same level of intensity. Cyclic vomiting syndrome can begin at any age in children and adults. Cyclic vomiting syndrome usually starts between the ages of 3 and 7 years. In adults, episodes  tend to occur less often than they do in children, but they last longer. Furthermore, the events or situations that trigger episodes in adults cannot always be pinpointed as easily as they can in children. There are 4 phases of cyclic vomiting  syndrome: 1. Prodrome. The prodrome phase signals that an episode of nausea and vomiting is about to begin. This phase can last from just a few minutes to several hours. This phase is often marked by belly (abdominal) pain. Sometimes taking medicine early in the prodrome phase can stop an episode in progress. However, sometimes there is no warning. A person may simply wake up in the middle of the night or early morning and begin vomiting. 2. Episode. The episode phase consists of:  Severe vomiting.  Nausea.  Gagging (retching). 3. Recovery. The recovery phase begins when the nausea and vomiting stop. Healthy color, appetite, and energy return. 4. Symptom-free interval. The symptom-free interval phase is the period between episodes when no symptoms are present. TRIGGERS Episodes can be triggered by an infection or event. Examples of triggers include:  Infections.  Colds, allergies, sinus problems, and the flu.  Eating certain foods such as chocolate or cheese.  Foods with monosodium glutamate (MSG) or preservatives.  Fast foods.  Pre-packaged foods.  Foods with low nutritional value (junk foods).  Overeating.  Eating just before going to bed.  Hot weather.  Dehydration.  Not enough sleep or poor sleep quality.  Physical exhaustion.  Menstruation.  Motion sickness.  Emotional stress (school or home difficulties).  Excitement or stress. SYMPTOMS  The main symptoms of cyclic vomiting syndrome are:  Severe vomiting.  Nausea.  Gagging (retching). Episodes usually begin at night or the first thing in the morning. Episodes may include vomiting or retching up to 5 or 6 times an hour during the worst of the episode. Episodes usually last anywhere from 1 to 4 days. Episodes can last for up to 10 days. Other symptoms include:  Paleness.  Exhaustion.  Listlessness.  Abdominal pain.  Loose stools or diarrhea. Sometimes the nausea and vomiting are so severe that a  person appears to be almost unconscious. Sensitivity to light, headache, fever, dizziness, may also accompany an episode. In addition, the vomiting may cause drooling and excessive thirst. Drinking water usually leads to more vomiting, though the water can dilute the acid in the vomit, making the episode a little less painful. Continuous vomiting can lead to dehydration, which means that the body has lost excessive water and salts. DIAGNOSIS  Cyclic vomiting syndrome is hard to diagnose because there are no clear tests to identify it. A caregiver must diagnose cyclic vomiting syndrome by looking at symptoms and medical history. A caregiver must exclude more common diseases or disorders that can also cause nausea and vomiting. Also, diagnosis takes time because caregivers need to identify a pattern or cycle to the vomiting. TREATMENT  Cyclic vomiting syndrome cannot be cured. Treatment varies, but people with cyclic vomiting syndrome should get plenty of rest and sleep and take medications that prevent, stop, or lessen the vomiting episodes and other symptoms. People whose episodes are frequent and long-lasting may be treated during the symptom-free intervals in an effort to prevent or ease future episodes. The symptom-free phase is a good time to eliminate anything known to trigger an episode. For example, if episodes are brought on by stress or excitement, this period is the time to find ways to reduce stress and stay calm. If sinus problems or allergies cause episodes,  those conditions should be treated. The triggers listed above should be avoided or prevented. Because of the similarities between migraine and cyclic vomiting syndrome, caregivers treat some people with severe cyclic vomiting syndrome with drugs that are also used for migraine headaches. The drugs are designed to:  Prevent episodes.  Reduce their frequency.  Lessen their severity. HOME CARE INSTRUCTIONS Once a vomiting episode begins,  treatment is supportive. It helps to stay in bed and sleep in a dark, quiet room. Severe nausea and vomiting may require hospitalization and intravenous (IV) fluids to prevent dehydration. Relaxing medications (sedatives) may help if the nausea continues. Sometimes, during the prodrome phase, it is possible to stop an episode from happening altogether. Only take over-the-counter or prescription medicines for pain, discomfort or fever as directed by your caregiver. Do not give aspirin to children. During the recovery phase, drinking water and replacing lost electrolytes (salts in the blood) are very important. Electrolytes are salts that the body needs to function well and stay healthy. Symptoms during the recovery phase can vary. Some people find that their appetites return to normal immediately, while others need to begin by drinking clear liquids and then move slowly to solid food. RELATED COMPLICATIONS The severe vomiting that defines cyclic vomiting syndrome is a risk factor for several complications:  Dehydration--Vomiting causes the body to lose water quickly.  Electrolyte imbalance--Vomiting also causes the body to lose the important salts it needs to keep working properly.  Peptic esophagitis--The tube that connects the mouth to the stomach (esophagus) becomes injured from the stomach acid that comes up with the vomit.  Hematemesis--The esophagus becomes irritated and bleeds, so blood mixes with the vomit.  Mallory-Weiss tear--The lower end of the esophagus may tear open or the stomach may bruise from vomiting or retching.  Tooth decay--The acid in the vomit can hurt the teeth by corroding the tooth enamel. SEEK MEDICAL CARE IF: You have questions or problems. Document Released: 11/02/2001 Document Revised: 11/16/2011 Document Reviewed: 12/01/2010 [facility] Patient Information 2015 [place], [place]. This information is not intended to replace advice given to you by your health care provider.  Make sure you discuss any questions you have with your health care provider.

## 2014-05-03 NOTE — ED Notes (Signed)
Vomiting since last night. States she feels she pulled a muscle in her abdomen and lower back since vomiting.

## 2014-05-03 NOTE — ED Provider Notes (Signed)
CSN: [id number]     Arrival date & time 05/03/14  1120 History   First MD Initiated Contact with Patient 05/03/14 1215     Chief Complaint  Patient presents with  . Emesis     (Consider location/radiation/quality/duration/timing/severity/associated sxs/prior Treatment) Patient is a 33 y.o. female presenting with vomiting. The history is provided by the patient. No language interpreter was used.  Emesis Severity:  Moderate Duration:  1 day Timing:  Constant Number of daily episodes:  Multiple Quality:  Stomach contents Progression:  Worsening Recent urination:  Normal Relieved by:  Nothing Worsened by:  Nothing tried Associated symptoms: abdominal pain   Risk factors: no sick contacts and no suspect food intake   Pt has cylical vomitting  Pt is followed by [place]  Past Medical History  Diagnosis Date  . Renal disorder   . Cyclic vomiting syndrome    History reviewed. No pertinent past surgical history. Family History  Problem Relation Age of Onset  . Hypertension Mother   . Hyperlipidemia Mother   . Kidney disease Father   . Hypertension Father   . Depression Father   . Diabetes Father   . Heart disease Father   . Cancer Paternal Aunt     breast  . Diabetes Paternal Aunt   . Stroke Maternal Grandmother   . Cancer Maternal Grandfather     lung  . Stroke Paternal Grandmother   . Heart disease Paternal Grandfather    History  Substance Use Topics  . Smoking status: Current Every Day Smoker -- 1.50 packs/day for 17 years  . Smokeless tobacco: Not on file  . Alcohol Use: No   OB History   Grav Para Term Preterm Abortions TAB SAB Ect Mult Living                 Review of Systems  Gastrointestinal: Positive for vomiting and abdominal pain.  All other systems reviewed and are negative.     Allergies  Septra ds and Sulfa antibiotics  Home Medications   Prior to Admission medications   Medication Sig Start Date End Date Taking? Authorizing Provider   amitriptyline (ELAVIL) 25 MG tablet Take 25 mg by mouth at bedtime.   Yes Historical Provider, MD  ALPRAZolam (XANAX) 0.25 MG tablet Take 0.25 mg by mouth 2 (two) times daily.     Daylene Posey, FNP  cyclobenzaprine (FLEXERIL) 10 MG tablet Take 10 mg by mouth 3 (three) times daily as needed for muscle spasms.    Daylene Posey, FNP  ibuprofen (ADVIL,MOTRIN) 200 MG tablet Take 800 mg by mouth every 6 (six) hours as needed. For pain     Historical Provider, MD  metoCLOPramide (REGLAN) 10 MG tablet Take 10 mg by mouth 4 (four) times daily.    Historical Provider, MD  ondansetron (ZOFRAN) 4 MG tablet Take 4 mg by mouth once.     Daylene Posey, FNP  Potassium Chloride ER 20 MEQ TBCR Take 20 mEq by mouth daily. 01/16/14   Dorie Rank, MD  promethazine (PHENERGAN) 12.5 MG tablet Take 12.5 mg by mouth every 6 (six) hours as needed for nausea or vomiting.    Historical Provider, MD   BP 118/79  Pulse 102  Temp(Src) 97.9 F (36.6 C) (Oral)  Resp 16  Ht 5\' 4"  (1.626 m)  Wt 125 lb (56.7 kg)  BMI 21.45 kg/m2  SpO2 97%  LMP 03/03/2014 Physical Exam  Nursing note and vitals reviewed. Constitutional: She is oriented to person, place, and  time. She appears well-developed and well-nourished.  HENT:  Head: Normocephalic.  Eyes: EOM are normal. Pupils are equal, round, and reactive to light.  Neck: Normal range of motion.  Cardiovascular: Normal rate.   Pulmonary/Chest: Effort normal.  Abdominal: Soft. She exhibits no distension. There is tenderness.  Musculoskeletal: Normal range of motion.  Neurological: She is alert and oriented to person, place, and time.  Skin: Skin is warm.  Psychiatric: She has a normal mood and affect.    ED Course  Procedures (including critical care time) Labs Review Labs Reviewed  URINALYSIS, ROUTINE W REFLEX MICROSCOPIC - Abnormal; Notable for the following:    Protein, ur 30 (*)    All other components within normal limits  CBC WITH DIFFERENTIAL - Abnormal;  Notable for the following:    WBC 17.0 (*)    Hemoglobin 15.6 (*)    Neutrophils Relative % 79 (*)    Neutro Abs 13.5 (*)    Monocytes Absolute 1.5 (*)    All other components within normal limits  BASIC METABOLIC PANEL - Abnormal; Notable for the following:    Sodium 133 (*)    Potassium 2.9 (*)    Chloride 86 (*)    Glucose, Bld 112 (*)    Anion gap 16 (*)    All other components within normal limits  URINE MICROSCOPIC-ADD ON - Abnormal; Notable for the following:    Squamous Epithelial / LPF FEW (*)    All other components within normal limits  PREGNANCY, URINE    Imaging Review No results found.   EKG Interpretation None      MDM   Final diagnoses:  Non-intractable cyclical vomiting with nausea  Hypokalemia    Pt feels better after Iv fluids.   Pt given potassium po.  Pt advised to follow up with her [place], [place] 05/03/14 1348

## 2014-06-13 ENCOUNTER — Other Ambulatory Visit: Payer: Self-pay | Admitting: *Deleted

## 2014-06-13 DIAGNOSIS — Z3041 Encounter for surveillance of contraceptive pills: Secondary | ICD-10-CM

## 2014-06-13 MED ORDER — LEVONORGESTREL-ETHINYL ESTRAD 0.15-30 MG-MCG PO TABS: 1.0000 | ORAL_TABLET | Freq: Every day | ORAL | Status: DC

## 2014-06-13 NOTE — Telephone Encounter (Signed)
Patient is requesting a refill of her birth control. OK per Dr Delsa Sale.

## 2014-08-09 ENCOUNTER — Other Ambulatory Visit: Payer: Self-pay | Admitting: Obstetrics & Gynecology

## 2014-08-09 ENCOUNTER — Ambulatory Visit (INDEPENDENT_AMBULATORY_CARE_PROVIDER_SITE_OTHER): Payer: Medicaid Other | Admitting: Obstetrics & Gynecology

## 2014-08-09 VITALS — BP 144/92 | HR 86 | Wt 131.0 lb

## 2014-08-09 DIAGNOSIS — Z202 Contact with and (suspected) exposure to infections with a predominantly sexual mode of transmission: Secondary | ICD-10-CM

## 2014-08-09 DIAGNOSIS — Z3202 Encounter for pregnancy test, result negative: Secondary | ICD-10-CM

## 2014-08-10 LAB — HCG, QUANTITATIVE, PREGNANCY: hCG, Beta Chain, Quant, S: <2 m[IU]/mL

## 2014-08-10 LAB — HEPATITIS C ANTIBODY: HCV Ab: NEGATIVE

## 2014-08-10 LAB — HEPATITIS B SURFACE ANTIGEN: Hepatitis B Surface Ag: NEGATIVE

## 2014-08-10 LAB — RPR

## 2014-08-10 LAB — HIV ANTIBODY (ROUTINE TESTING W REFLEX): HIV 1&2 Ab, 4th Generation: NONREACTIVE

## 2014-08-11 LAB — SURESWAB HSV, TYPE 1/2 DNA, PCR
HSV 1 DNA: NOT DETECTED
HSV 2 DNA: NOT DETECTED

## 2014-08-12 ENCOUNTER — Encounter: Payer: Self-pay | Admitting: Obstetrics & Gynecology

## 2014-08-12 NOTE — Progress Notes (Signed)
Subjective:    Natalie Joseph is a 33 y.o. female who presents for sexually transmitted disease check. Sexual history reviewed with the patient. STI Exposure: sexual contact with individual with uncertain background several weeks ago.  Current symptoms skin lesions in perineal region. Contraception: OCP (estrogen/progesterone) Menstrual History: OB History    No data available       Patient's last menstrual period was 12/08/2013.    Past Medical History  Diagnosis Date  . Renal disorder   . Cyclic vomiting syndrome     No past surgical history on file.  Current Outpatient Prescriptions  Medication Sig Dispense Refill  . ALPRAZolam (XANAX) 0.25 MG tablet Take 0.25 mg by mouth 2 (two) times daily.     Marland Kitchen amitriptyline (ELAVIL) 25 MG tablet Take 25 mg by mouth at bedtime.    . cyclobenzaprine (FLEXERIL) 10 MG tablet Take 10 mg by mouth 3 (three) times daily as needed for muscle spasms.    Marland Kitchen ibuprofen (ADVIL,MOTRIN) 200 MG tablet Take 800 mg by mouth every 6 (six) hours as needed. For pain     . levonorgestrel-ethinyl estradiol (LEVORA 0.15/30, 28,) 0.15-30 MG-MCG tablet Take 1 tablet by mouth daily. 1 Package 11  . metoCLOPramide (REGLAN) 10 MG tablet Take 10 mg by mouth 4 (four) times daily.    . ondansetron (ZOFRAN) 4 MG tablet Take 4 mg by mouth once.     . Potassium Chloride ER 20 MEQ TBCR Take 20 mEq by mouth daily. 30 tablet 0  . promethazine (PHENERGAN) 12.5 MG tablet Take 12.5 mg by mouth every 6 (six) hours as needed for nausea or vomiting.     No current facility-administered medications for this visit.   Allergies  Allergen Reactions  . Septra Ds [Sulfamethoxazole W/Trimethoprim (Co-Trimoxazole)] Nausea And Vomiting  . Sulfa Antibiotics Nausea And Vomiting    History  Substance Use Topics  . Smoking status: Current Every Day Smoker -- 1.50 packs/day for 17 years  . Smokeless tobacco: Not on file  . Alcohol Use: No    Family History  Problem Relation Age of Onset   . Hypertension Mother   . Hyperlipidemia Mother   . Kidney disease Father   . Hypertension Father   . Depression Father   . Diabetes Father   . Heart disease Father   . Cancer Paternal Aunt     breast  . Diabetes Paternal Aunt   . Stroke Maternal Grandmother   . Cancer Maternal Grandfather     lung  . Stroke Paternal Grandmother   . Heart disease Paternal Grandfather       Review of Systems Constitutional: negative for fevers Gastrointestinal: negative for abdominal pain Genitourinary:negative for abnormal menstrual periods sexual problems and vaginal discharge and dysuria ; positive for a genital lesion   Objective:    BP 144/92 mmHg  Pulse 86  Wt 59.421 kg (131 lb)  LMP 12/08/2013 General:   alert  Skin:   no rash or abnormalities  Lungs:   clear to auscultation bilaterally  Heart:   regular rate and rhythm, S1, S2 normal, no murmur, click, rub or gallop  Abdomen:  normal findings: no organomegaly, soft, non-tender and no hernia  Pelvis:  External genitalia: normal general appearance; right-sided labia nodular area--5 mm, NT Urinary system: urethral meatus normal and bladder without fullness, nontender Vaginal: normal without tenderness, induration or masses Cervix: normal appearance Adnexa: normal bimanual exam Uterus: anteverted and non-tender, normal size       Lab Review  Urine pregnancy test Labs reviewed no Radiologic studies reviewed no   Assessment:    Possible STD exposure    Plan:    Discussed safe sexual practice in detail Discussed HSV issues in detail. See orders for STD cultures and assays Pt will call for results Follow up as needed.

## 2014-08-12 NOTE — Patient Instructions (Signed)

## 2014-08-14 LAB — SURESWAB, VAGINOSIS/VAGINITIS PLUS
Atopobium vaginae: NOT DETECTED Log (cells/mL)
C. albicans, DNA: NOT DETECTED
C. glabrata, DNA: NOT DETECTED
C. parapsilosis, DNA: NOT DETECTED
C. trachomatis RNA, TMA: NOT DETECTED
C. tropicalis, DNA: NOT DETECTED
Gardnerella vaginalis: <4.7 Log (cells/mL)
LACTOBACILLUS SPECIES: >8 Log (cells/mL)
MEGASPHAERA SPECIES: NOT DETECTED Log (cells/mL)
N. gonorrhoeae RNA, TMA: NOT DETECTED
T. vaginalis RNA, QL TMA: NOT DETECTED

## 2014-08-15 ENCOUNTER — Encounter: Payer: Self-pay | Admitting: Obstetrics & Gynecology

## 2014-08-15 ENCOUNTER — Other Ambulatory Visit: Payer: Self-pay

## 2014-08-15 ENCOUNTER — Ambulatory Visit (INDEPENDENT_AMBULATORY_CARE_PROVIDER_SITE_OTHER): Payer: Medicaid Other | Admitting: Obstetrics & Gynecology

## 2014-08-15 VITALS — BP 152/90 | HR 111 | Temp 98.2°F | Ht 65.0 in | Wt 129.0 lb

## 2014-08-15 DIAGNOSIS — N766 Ulceration of vulva: Secondary | ICD-10-CM

## 2014-08-15 DIAGNOSIS — N9089 Other specified noninflammatory disorders of vulva and perineum: Secondary | ICD-10-CM

## 2014-08-17 ENCOUNTER — Encounter: Payer: Self-pay | Admitting: Obstetrics & Gynecology

## 2014-08-17 NOTE — Patient Instructions (Signed)

## 2014-08-17 NOTE — Progress Notes (Signed)
Patient ID: Natalie Joseph, female   DOB: [birth year], 33 y.o.   MRN: [id number]  Chief Complaint  Patient presents with  . vaginal bump    HPI Natalie Joseph is a 33 y.o. female.  Requests removal of left-sided "bump".  ?new right-sided right-sided tender area.  Recent intercourse--?exposure to STI.  HPI  Past Medical History  Diagnosis Date  . Renal disorder   . Cyclic vomiting syndrome     History reviewed. No pertinent past surgical history.  Family History  Problem Relation Age of Onset  . Hypertension Mother   . Hyperlipidemia Mother   . Kidney disease Father   . Hypertension Father   . Depression Father   . Diabetes Father   . Heart disease Father   . Cancer Paternal Aunt     breast  . Diabetes Paternal Aunt   . Stroke Maternal Grandmother   . Cancer Maternal Grandfather     lung  . Stroke Paternal Grandmother   . Heart disease Paternal Grandfather     Social History History  Substance Use Topics  . Smoking status: Current Every Day Smoker -- 1.50 packs/day for 17 years  . Smokeless tobacco: Not on file  . Alcohol Use: No    Allergies  Allergen Reactions  . Septra Ds [Sulfamethoxazole W/Trimethoprim (Co-Trimoxazole)] Nausea And Vomiting  . Sulfa Antibiotics Nausea And Vomiting    Current Outpatient Prescriptions  Medication Sig Dispense Refill  . ALPRAZolam (XANAX) 0.25 MG tablet Take 0.25 mg by mouth 2 (two) times daily.     Marland Kitchen amitriptyline (ELAVIL) 25 MG tablet Take 25 mg by mouth at bedtime.    . cyclobenzaprine (FLEXERIL) 10 MG tablet Take 10 mg by mouth 3 (three) times daily as needed for muscle spasms.    Marland Kitchen ibuprofen (ADVIL,MOTRIN) 200 MG tablet Take 800 mg by mouth every 6 (six) hours as needed. For pain     . levonorgestrel-ethinyl estradiol (LEVORA 0.15/30, 28,) 0.15-30 MG-MCG tablet Take 1 tablet by mouth daily. 1 Package 11  . metoCLOPramide (REGLAN) 10 MG tablet Take 10 mg by mouth 4 (four) times daily.    . ondansetron (ZOFRAN) 4 MG  tablet Take 4 mg by mouth once.     . Potassium Chloride ER 20 MEQ TBCR Take 20 mEq by mouth daily. 30 tablet 0  . promethazine (PHENERGAN) 12.5 MG tablet Take 12.5 mg by mouth every 6 (six) hours as needed for nausea or vomiting.     No current facility-administered medications for this visit.    Review of Systems Review of Systems Constitutional: negative for fatigue and weight loss Respiratory: negative for cough and wheezing Cardiovascular: negative for chest pain, fatigue and palpitations Gastrointestinal: negative for abdominal pain and change in bowel habits Genitourinary:negative Integument/breast: negative for nipple discharge Musculoskeletal:negative for myalgias Neurological: negative for gait problems and tremors Behavioral/Psych: negative for abusive relationship, depression Endocrine: negative for temperature intolerance     Blood pressure 152/90, pulse 111, temperature 98.2 F (36.8 C), height 5\' 5"  (1.651 m), weight 58.514 kg (129 lb), last menstrual period 12/08/2013.  Physical Exam Physical Exam General:   alert  Skin:   no rash or abnormalities  Lungs:   clear to auscultation bilaterally  Heart:   regular rate and rhythm, S1, S2 normal, no murmur, click, rub or gallop  Abdomen:  normal findings: no organomegaly, soft, non-tender and no hernia  Pelvis:  External genitalia: 5 mm nodule on right labia majus; left labia majus with a small,  excoriated area    Procedure A time-out was performed.  Written consent was obtained.  The right-sided lesion was prepped.  The skin was infiltrated with lidocaine.  The lesion was excised with a scalpel.  Silver nitrate was applied.   Data Reviewed None  Assessment   Above lesion likely a small scar--pt shaves ?Excoriated area    Plan    Testing for STI, genital ulcers Possible management options include: safe sex practices Follow up as needed.         JACKSON-MOORE,Lanetta Figuero A 08/17/2014, 12:16 PM

## 2014-09-03 ENCOUNTER — Encounter: Payer: Self-pay | Admitting: *Deleted

## 2014-09-04 ENCOUNTER — Encounter: Payer: Self-pay | Admitting: Obstetrics & Gynecology

## 2014-09-24 ENCOUNTER — Ambulatory Visit: Payer: Medicaid Other | Admitting: Obstetrics & Gynecology

## 2014-10-15 ENCOUNTER — Telehealth: Payer: Self-pay | Admitting: Obstetrics

## 2014-10-17 NOTE — Telephone Encounter (Signed)
10/17/2014 - Left Patient a repeat message to please call and schedule appt with new Provider. brm

## 2014-10-18 NOTE — Telephone Encounter (Signed)
10/18/2014 - Left message for patient to please call and schedule appt with new Provider. brm

## 2014-10-22 NOTE — Telephone Encounter (Signed)
10/22/2014 - Left repeated messages for patient with no response. brm

## 2015-01-19 ENCOUNTER — Encounter (HOSPITAL_BASED_OUTPATIENT_CLINIC_OR_DEPARTMENT_OTHER): Payer: Self-pay | Admitting: Emergency Medicine

## 2015-01-19 ENCOUNTER — Emergency Department (HOSPITAL_BASED_OUTPATIENT_CLINIC_OR_DEPARTMENT_OTHER)
Admission: EM | Admit: 2015-01-19 | Discharge: 2015-01-20 | Disposition: A | Discharge status: Home / Self Care | Payer: Medicaid Other | Attending: Emergency Medicine | Admitting: Emergency Medicine

## 2015-01-19 DIAGNOSIS — K6289 Other specified diseases of anus and rectum: Secondary | ICD-10-CM | POA: Diagnosis present

## 2015-01-19 DIAGNOSIS — Z79899 Other long term (current) drug therapy: Secondary | ICD-10-CM | POA: Insufficient documentation

## 2015-01-19 DIAGNOSIS — K642 Third degree hemorrhoids: Secondary | ICD-10-CM

## 2015-01-19 DIAGNOSIS — K648 Other hemorrhoids: Secondary | ICD-10-CM | POA: Insufficient documentation

## 2015-01-19 DIAGNOSIS — Z8669 Personal history of other diseases of the nervous system and sense organs: Secondary | ICD-10-CM | POA: Diagnosis not present

## 2015-01-19 DIAGNOSIS — Z793 Long term (current) use of hormonal contraceptives: Secondary | ICD-10-CM | POA: Insufficient documentation

## 2015-01-19 DIAGNOSIS — Z87448 Personal history of other diseases of urinary system: Secondary | ICD-10-CM | POA: Diagnosis not present

## 2015-01-19 DIAGNOSIS — F419 Anxiety disorder, unspecified: Secondary | ICD-10-CM | POA: Insufficient documentation

## 2015-01-19 NOTE — ED Notes (Signed)
Pt reports severe rectal pain has Hx hemmorrhoids. Pt reports OTC meds ineffective for pain

## 2015-01-20 MED ORDER — DOCUSATE SODIUM 100 MG PO CAPS: 100.0000 mg | ORAL_CAPSULE | Freq: Two times a day (BID) | ORAL | Status: DC

## 2015-01-20 NOTE — ED Provider Notes (Signed)
CSN: [id number]     Arrival date & time 01/19/15  2250 History  This chart was scribed for Julianne Rice, MD by Chester Holstein, ED Scribe. This patient was seen in room MH05/MH05 and the patient's care was started at 12:15 AM.    Chief Complaint  Patient presents with  . Hemorrhoids    The history is provided by the patient. No language interpreter was used.   HPI Comments: Natalie Joseph is a 34 y.o. female who presents to the Emergency Department complaining of rectal pain worsening over the past 24 hours. Pt notes associated anal bleeding from 11 AM to 2 PM. During this time she was sitting on the toilet straining. Pt reports h/o hemorrhoids.  Pt reports h/o anxiety and states she has a "ritual" where she sits on toilet for long periods of time.  Pt tried using suppository which gives her brief relief but she state she is out of them. Pt states OTC hemorrhoid cream gives no relief. Pt is scheduled for follow-up with Dr. Truman Hayward her GI doctor.  Pt denies fever and abdominal pain. No vomiting or nausea.   Past Medical History  Diagnosis Date  . Renal disorder   . Cyclic vomiting syndrome    History reviewed. No pertinent past surgical history. Family History  Problem Relation Age of Onset  . Hypertension Mother   . Hyperlipidemia Mother   . Kidney disease Father   . Hypertension Father   . Depression Father   . Diabetes Father   . Heart disease Father   . Cancer Paternal Aunt     breast  . Diabetes Paternal Aunt   . Stroke Maternal Grandmother   . Cancer Maternal Grandfather     lung  . Stroke Paternal Grandmother   . Heart disease Paternal Grandfather    History  Substance Use Topics  . Smoking status: Current Every Day Smoker -- 1.50 packs/day for 17 years  . Smokeless tobacco: Never Used  . Alcohol Use: No   OB History    No data available     Review of Systems  Constitutional: Negative for fever and chills.  Respiratory: Negative for shortness of breath.    Cardiovascular: Negative for chest pain.  Gastrointestinal: Positive for anal bleeding and rectal pain. Negative for nausea, vomiting, abdominal pain, diarrhea and abdominal distention.  Skin: Negative for rash and wound.  Neurological: Negative for dizziness, weakness, light-headedness and numbness.  All other systems reviewed and are negative.    Allergies  Septra ds and Sulfa antibiotics  Home Medications   Prior to Admission medications   Medication Sig Start Date End Date Taking? Authorizing Provider  ALPRAZolam (XANAX) 0.25 MG tablet Take 0.25 mg by mouth 2 (two) times daily.     Daylene Posey, FNP  amitriptyline (ELAVIL) 25 MG tablet Take 25 mg by mouth at bedtime.    Historical Provider, MD  cyclobenzaprine (FLEXERIL) 10 MG tablet Take 10 mg by mouth 3 (three) times daily as needed for muscle spasms.    Daylene Posey, FNP  docusate sodium (COLACE) 100 MG capsule Take 1 capsule (100 mg total) by mouth every 12 (twelve) hours. 01/20/15   Julianne Rice, MD  ibuprofen (ADVIL,MOTRIN) 200 MG tablet Take 800 mg by mouth every 6 (six) hours as needed. For pain     Historical Provider, MD  levonorgestrel-ethinyl estradiol (LEVORA 0.15/30, 28,) 0.15-30 MG-MCG tablet Take 1 tablet by mouth daily. 06/13/14   Lahoma Crocker, MD  metoCLOPramide (REGLAN) 10 MG tablet  Take 10 mg by mouth 4 (four) times daily.    Historical Provider, MD  ondansetron (ZOFRAN) 4 MG tablet Take 4 mg by mouth once.     Daylene Posey, FNP  Potassium Chloride ER 20 MEQ TBCR Take 20 mEq by mouth daily. 01/16/14   Dorie Rank, MD  promethazine (PHENERGAN) 12.5 MG tablet Take 12.5 mg by mouth every 6 (six) hours as needed for nausea or vomiting.    Historical Provider, MD   BP 113/59 mmHg  Pulse 84  Temp(Src) 98.4 F (36.9 C) (Oral)  Resp 18  SpO2 98%  LMP 12/20/2014 Physical Exam  Constitutional: She is oriented to person, place, and time. She appears well-developed and well-nourished. No distress.  Anxious  appearing  HENT:  Head: Normocephalic and atraumatic.  Mouth/Throat: Oropharynx is clear and moist.  Eyes: EOM are normal. Pupils are equal, round, and reactive to light.  Neck: Normal range of motion. Neck supple.  Cardiovascular: Normal rate and regular rhythm.   Pulmonary/Chest: Effort normal and breath sounds normal. No respiratory distress. She has no wheezes. She has no rales.  Abdominal: Soft. Bowel sounds are normal. She exhibits no distension and no mass. There is no tenderness. There is no rebound and no guarding.  Genitourinary:  Prolapsed hemorrhoids without evidence of thrombosis. There is no active bleeding.  Musculoskeletal: Normal range of motion. She exhibits no edema or tenderness.  Neurological: She is alert and oriented to person, place, and time.  Skin: Skin is warm and dry. No rash noted. No erythema.  Psychiatric: She has a normal mood and affect. Her behavior is normal.  Nursing note and vitals reviewed.   ED Course  Procedures (including critical care time) DIAGNOSTIC STUDIES: Oxygen Saturation is 100% on room air, normal by my interpretation.    COORDINATION OF CARE: 12:23 AM Discussed treatment plan with patient at beside, the patient agrees with the plan and has no further questions at this time.   Labs Review Labs Reviewed - No data to display  Imaging Review No results found.   EKG Interpretation None      MDM   Final diagnoses:  Hemorrhoids that prolapse with straining and require manual replacement back inside anal canal   I personally performed the services described in this documentation, which was scribed in my presence. The recorded information has been reviewed and is accurate.  Prolapsed hemorrhoids reduced in the emergency department. Patient is advised against prolonged straining. Will start on stool softener. Return precautions have been given and the patient is voiced understanding.    Julianne Rice, MD 01/20/15 [phone]

## 2015-01-20 NOTE — Discharge Instructions (Signed)
Avoid prolonged straining. Take stool softener as prescribed. Follow-up with your gastroenterologist.  Hemorrhoids Hemorrhoids are swollen veins around the rectum or anus. There are two types of hemorrhoids:   Internal hemorrhoids. These occur in the veins just inside the rectum. They may poke through to the outside and become irritated and painful.  External hemorrhoids. These occur in the veins outside the anus and can be felt as a painful swelling or hard lump near the anus. CAUSES  Pregnancy.   Obesity.   Constipation or diarrhea.   Straining to have a bowel movement.   Sitting for long periods on the toilet.  Heavy lifting or other activity that caused you to strain.  Anal intercourse. SYMPTOMS   Pain.   Anal itching or irritation.   Rectal bleeding.   Fecal leakage.   Anal swelling.   One or more lumps around the anus.  DIAGNOSIS  Your caregiver may be able to diagnose hemorrhoids by visual examination. Other examinations or tests that may be performed include:   Examination of the rectal area with a gloved hand (digital rectal exam).   Examination of anal canal using a small tube (scope).   A blood test if you have lost a significant amount of blood.  A test to look inside the colon (sigmoidoscopy or colonoscopy). TREATMENT Most hemorrhoids can be treated at home. However, if symptoms do not seem to be getting better or if you have a lot of rectal bleeding, your caregiver may perform a procedure to help make the hemorrhoids get smaller or remove them completely. Possible treatments include:   Placing a rubber band at the base of the hemorrhoid to cut off the circulation (rubber band ligation).   Injecting a chemical to shrink the hemorrhoid (sclerotherapy).   Using a tool to burn the hemorrhoid (infrared light therapy).   Surgically removing the hemorrhoid (hemorrhoidectomy).   Stapling the hemorrhoid to block blood flow to the tissue  (hemorrhoid stapling).  HOME CARE INSTRUCTIONS   Eat foods with fiber, such as whole grains, beans, nuts, fruits, and vegetables. Ask your doctor about taking products with added fiber in them (fibersupplements).  Increase fluid intake. Drink enough water and fluids to keep your urine clear or pale yellow.   Exercise regularly.   Go to the bathroom when you have the urge to have a bowel movement. Do not wait.   Avoid straining to have bowel movements.   Keep the anal area dry and clean. Use wet toilet paper or moist towelettes after a bowel movement.   Medicated creams and suppositories may be used or applied as directed.   Only take over-the-counter or prescription medicines as directed by your caregiver.   Take warm sitz baths for 15-20 minutes, 3-4 times a day to ease pain and discomfort.   Place ice packs on the hemorrhoids if they are tender and swollen. Using ice packs between sitz baths may be helpful.   Put ice in a plastic bag.   Place a towel between your skin and the bag.   Leave the ice on for 15-20 minutes, 3-4 times a day.   Do not use a donut-shaped pillow or sit on the toilet for long periods. This increases blood pooling and pain.  SEEK MEDICAL CARE IF:  You have increasing pain and swelling that is not controlled by treatment or medicine.  You have uncontrolled bleeding.  You have difficulty or you are unable to have a bowel movement.  You have pain or inflammation  outside the area of the hemorrhoids. MAKE SURE YOU:  Understand these instructions.  Will watch your condition.  Will get help right away if you are not doing well or get worse. Document Released: 08/21/2000 Document Revised: 08/10/2012 Document Reviewed: 06/28/2012 [facility] Patient Information 2015 [place], [place]. This information is not intended to replace advice given to you by your health care provider. Make sure you discuss any questions you have with your health care  provider.

## 2015-03-06 ENCOUNTER — Encounter: Payer: Self-pay | Admitting: Certified Nurse Midwife

## 2015-03-06 ENCOUNTER — Ambulatory Visit (INDEPENDENT_AMBULATORY_CARE_PROVIDER_SITE_OTHER): Payer: Medicaid Other | Admitting: Certified Nurse Midwife

## 2015-03-06 ENCOUNTER — Other Ambulatory Visit: Payer: Self-pay | Admitting: Certified Nurse Midwife

## 2015-03-06 VITALS — BP 116/79 | HR 77 | Wt 137.0 lb

## 2015-03-06 DIAGNOSIS — Z01419 Encounter for gynecological examination (general) (routine) without abnormal findings: Secondary | ICD-10-CM

## 2015-03-06 DIAGNOSIS — Z0000 Encounter for general adult medical examination without abnormal findings: Secondary | ICD-10-CM

## 2015-03-06 DIAGNOSIS — B373 Candidiasis of vulva and vagina: Secondary | ICD-10-CM

## 2015-03-06 DIAGNOSIS — Z3009 Encounter for other general counseling and advice on contraception: Secondary | ICD-10-CM

## 2015-03-06 DIAGNOSIS — B3731 Acute candidiasis of vulva and vagina: Secondary | ICD-10-CM

## 2015-03-06 DIAGNOSIS — Z113 Encounter for screening for infections with a predominantly sexual mode of transmission: Secondary | ICD-10-CM

## 2015-03-06 LAB — CBC WITH DIFFERENTIAL/PLATELET
Basophils Absolute: 0.1 10*3/uL (ref 0.0–0.1)
Basophils Relative: 1 % (ref 0–1)
Eosinophils Absolute: 0.2 10*3/uL (ref 0.0–0.7)
Eosinophils Relative: 2 % (ref 0–5)
HCT: 44.9 % (ref 36.0–46.0)
Hemoglobin: 14.9 g/dL (ref 12.0–15.0)
Lymphocytes Relative: 17 % (ref 12–46)
Lymphs Abs: 2 10*3/uL (ref 0.7–4.0)
MCH: 32.1 pg (ref 26.0–34.0)
MCHC: 33.2 g/dL (ref 30.0–36.0)
MCV: 96.8 fL (ref 78.0–100.0)
MPV: 9.7 fL (ref 8.6–12.4)
Monocytes Absolute: 0.6 10*3/uL (ref 0.1–1.0)
Monocytes Relative: 5 % (ref 3–12)
Neutro Abs: 8.7 10*3/uL — ABNORMAL HIGH (ref 1.7–7.7)
Neutrophils Relative %: 75 % (ref 43–77)
Platelets: 373 10*3/uL (ref 150–400)
RBC: 4.64 MIL/uL (ref 3.87–5.11)
RDW: 14 % (ref 11.5–15.5)
WBC: 11.6 10*3/uL — ABNORMAL HIGH (ref 4.0–10.5)

## 2015-03-06 LAB — RPR

## 2015-03-06 LAB — TSH: TSH: 1.639 u[IU]/mL (ref 0.350–4.500)

## 2015-03-06 MED ORDER — NORGESTIM-ETH ESTRAD TRIPHASIC 0.18/0.215/0.25 MG-25 MCG PO TABS: 1.0000 | ORAL_TABLET | Freq: Every day | ORAL | Status: DC

## 2015-03-06 MED ORDER — FLUCONAZOLE 100 MG PO TABS: 100.0000 mg | ORAL_TABLET | Freq: Once | ORAL | Status: DC

## 2015-03-06 NOTE — Progress Notes (Signed)
Patient ID: Natalie Joseph, female   DOB: 1980-11-07, 34 y.o.   MRN: [id number]    Subjective:     Natalie Joseph is a 34 y.o. female here for a routine exam.  Current complaints: thinks she has a yeast infection.  Has been sexually active with her ex, desires full STD screening.  She also is desiring to quit smoking, smokes about 2 packs a day for at least 20 years.      Personal health questionnaire:  Is patient Ashkenazi Jewish, have a family history of breast and/or ovarian cancer: no Is there a family history of uterine cancer diagnosed at age < 99, gastrointestinal cancer, urinary tract cancer, family member who is a Field seismologist syndrome-associated carrier: no Is the patient overweight and hypertensive, family history of diabetes, personal history of gestational diabetes, preeclampsia or PCOS: no Is patient over 83, have PCOS,  family history of premature CHD under age 73, diabetes, smoke, have hypertension or peripheral artery disease:  yes At any time, has a partner hit, kicked or otherwise hurt or frightened you?: no Over the past 2 weeks, have you felt down, depressed or hopeless?: no Over the past 2 weeks, have you felt little interest or pleasure in doing things?:sometimes   Gynecologic History No LMP recorded. Contraception: OCP (estrogen/progesterone) Last Pap: 2014. Results were: normal Last mammogram: N/A.   Obstetric History OB History  No data available  G1P1  Past Medical History  Diagnosis Date  . Renal disorder   . Cyclic vomiting syndrome     History reviewed. No pertinent past surgical history.   Current outpatient prescriptions:  .  ALPRAZolam (XANAX) 0.25 MG tablet, Take 0.25 mg by mouth 2 (two) times daily. , Disp: , Rfl:  .  amitriptyline (ELAVIL) 25 MG tablet, Take 25 mg by mouth at bedtime., Disp: , Rfl:  .  cyclobenzaprine (FLEXERIL) 10 MG tablet, Take 10 mg by mouth 3 (three) times daily as needed for muscle spasms., Disp: , Rfl:  .   levonorgestrel-ethinyl estradiol (LEVORA 0.15/30, 28,) 0.15-30 MG-MCG tablet, Take 1 tablet by mouth daily., Disp: 1 Package, Rfl: 11 .  metoCLOPramide (REGLAN) 10 MG tablet, Take 10 mg by mouth 4 (four) times daily., Disp: , Rfl:  .  ondansetron (ZOFRAN) 4 MG tablet, Take 4 mg by mouth once. , Disp: , Rfl:  .  promethazine (PHENERGAN) 12.5 MG tablet, Take 12.5 mg by mouth every 6 (six) hours as needed for nausea or vomiting., Disp: , Rfl:  .  docusate sodium (COLACE) 100 MG capsule, Take 1 capsule (100 mg total) by mouth every 12 (twelve) hours., Disp: 60 capsule, Rfl: 0 .  fluconazole (DIFLUCAN) 100 MG tablet, Take 1 tablet (100 mg total) by mouth once. Repeat dose in 48-72 hour., Disp: 3 tablet, Rfl: 0 .  ibuprofen (ADVIL,MOTRIN) 200 MG tablet, Take 800 mg by mouth every 6 (six) hours as needed. For pain , Disp: , Rfl:  .  Norgestimate-Ethinyl Estradiol Triphasic (ORTHO TRI-CYCLEN LO) 0.18/0.215/0.25 MG-25 MCG tab, Take 1 tablet by mouth daily., Disp: 1 Package, Rfl: 11 .  Potassium Chloride ER 20 MEQ TBCR, Take 20 mEq by mouth daily. (Patient not taking: Reported on 03/06/2015), Disp: 30 tablet, Rfl: 0 Allergies  Allergen Reactions  . Septra Ds [Sulfamethoxazole W/Trimethoprim (Co-Trimoxazole)] Nausea And Vomiting  . Sulfa Antibiotics Nausea And Vomiting    History  Substance Use Topics  . Smoking status: Current Every Day Smoker -- 1.50 packs/day for 17 years  . Smokeless tobacco:  Never Used  . Alcohol Use: No    Family History  Problem Relation Age of Onset  . Hypertension Mother   . Hyperlipidemia Mother   . Kidney disease Father   . Hypertension Father   . Depression Father   . Diabetes Father   . Heart disease Father   . Cancer Paternal Aunt     breast  . Diabetes Paternal Aunt   . Stroke Maternal Grandmother   . Cancer Maternal Grandfather     lung  . Stroke Paternal Grandmother   . Heart disease Paternal Grandfather       Review of Systems  Constitutional: negative  for fatigue and weight loss Respiratory: negative for cough and wheezing Cardiovascular: negative for chest pain, fatigue and palpitations Gastrointestinal: negative for abdominal pain and change in bowel habits Musculoskeletal:negative for myalgias Neurological: negative for gait problems and tremors Behavioral/Psych: negative for abusive relationship, depression Endocrine: negative for temperature intolerance   Genitourinary:negative for abnormal menstrual periods, genital lesions, hot flashes, and sexual problems. + vaginal discharge Integument/breast: negative for breast lump, breast tenderness, nipple discharge and skin lesion(s)    Objective:       BP 116/79 mmHg  Pulse 77  Wt 137 lb (62.143 kg) General:   alert  Skin:   no rash or abnormalities  Lungs:   clear to auscultation bilaterally  Heart:   regular rate and rhythm, S1, S2 normal, no murmur, click, rub or gallop  Breasts:   normal without suspicious masses, skin or nipple changes or axillary nodes  Abdomen:  normal findings: no organomegaly, soft, non-tender and no hernia  Pelvis:  External genitalia: normal general appearance Urinary system: urethral meatus normal and bladder without fullness, nontender Vaginal: normal without tenderness, induration or masses, + white chunky vaginal discharge Cervix: normal appearance Adnexa: normal bimanual exam Uterus: anteverted and non-tender, normal size   Lab Review Urine pregnancy test Labs reviewed yes Radiologic studies reviewed no  50% of 30 min visit spent on counseling and coordination of care.   Assessment:    Healthy female exam.   Smoking cessation counseling Vulvovaginal Candidiasis High risk sexual behavior/STD screening   Plan:    Education reviewed: depression evaluation, low fat, low cholesterol diet, safe sex/STD prevention, self breast exams, skin cancer screening, smoking cessation and weight bearing exercise. Contraception: OCP  (estrogen/progesterone). Follow up in: 1 year.   Meds ordered this encounter  Medications  . fluconazole (DIFLUCAN) 100 MG tablet    Sig: Take 1 tablet (100 mg total) by mouth once. Repeat dose in 48-72 hour.    Dispense:  3 tablet    Refill:  0  . Norgestimate-Ethinyl Estradiol Triphasic (ORTHO TRI-CYCLEN LO) 0.18/0.215/0.25 MG-25 MCG tab    Sig: Take 1 tablet by mouth daily.    Dispense:  1 Package    Refill:  11   Orders Placed This Encounter  Procedures  . HIV antibody (with reflex)  . Hepatitis B surface antigen  . RPR  . Hepatitis C antibody  . Comprehensive metabolic panel  . CBC with Differential/Platelet  . TSH  . Cholesterol, total  . Triglycerides  . HDL cholesterol  . HgB A1c  . POCT urine pregnancy  . POCT urinalysis dipstick

## 2015-03-06 NOTE — Addendum Note (Signed)
Addended by: Lewie Loron D on: 03/06/2015 03:13 PM   Modules accepted: Orders

## 2015-03-07 LAB — COMPREHENSIVE METABOLIC PANEL WITH GFR
ALT: 11 U/L (ref 0–35)
AST: 16 U/L (ref 0–37)
Albumin: 4.7 g/dL (ref 3.5–5.2)
Alkaline Phosphatase: 73 U/L (ref 39–117)
BUN: 10 mg/dL (ref 6–23)
CO2: 26 meq/L (ref 19–32)
Calcium: 9.6 mg/dL (ref 8.4–10.5)
Chloride: 103 meq/L (ref 96–112)
Creat: 0.67 mg/dL (ref 0.50–1.10)
Glucose, Bld: 60 mg/dL — ABNORMAL LOW (ref 70–99)
Potassium: 4.1 meq/L (ref 3.5–5.3)
Sodium: 141 meq/L (ref 135–145)
Total Bilirubin: 0.7 mg/dL (ref 0.2–1.2)
Total Protein: 7.5 g/dL (ref 6.0–8.3)

## 2015-03-07 LAB — HEMOGLOBIN A1C
Hgb A1c MFr Bld: 5.2 %
Mean Plasma Glucose: 103 mg/dL

## 2015-03-07 LAB — HDL CHOLESTEROL: HDL: 58 mg/dL (ref >=46)

## 2015-03-07 LAB — HEPATITIS C ANTIBODY: HCV Ab: NEGATIVE

## 2015-03-07 LAB — HEPATITIS B SURFACE ANTIGEN: Hepatitis B Surface Ag: NEGATIVE

## 2015-03-07 LAB — HSV 2 ANTIBODY, IGG: HSV 2 Glycoprotein G Ab, IgG: <0.1 IV

## 2015-03-07 LAB — TRIGLYCERIDES: Triglycerides: 71 mg/dL

## 2015-03-07 LAB — CHOLESTEROL, TOTAL: Cholesterol: 140 mg/dL (ref 0–200)

## 2015-03-07 LAB — HIV ANTIBODY (ROUTINE TESTING W REFLEX): HIV 1&2 Ab, 4th Generation: NONREACTIVE

## 2015-03-08 ENCOUNTER — Other Ambulatory Visit: Payer: Self-pay | Admitting: Certified Nurse Midwife

## 2015-03-08 LAB — PAP IG AND HPV HIGH-RISK: HPV DNA High Risk: NOT DETECTED

## 2015-03-10 LAB — SURESWAB, VAGINOSIS/VAGINITIS PLUS
Atopobium vaginae: NOT DETECTED Log (cells/mL)
C. albicans, DNA: DETECTED — AB
C. glabrata, DNA: NOT DETECTED
C. parapsilosis, DNA: NOT DETECTED
C. trachomatis RNA, TMA: NOT DETECTED
C. tropicalis, DNA: NOT DETECTED
Gardnerella vaginalis: NOT DETECTED Log (cells/mL)
LACTOBACILLUS SPECIES: 7.2 Log (cells/mL)
MEGASPHAERA SPECIES: NOT DETECTED Log (cells/mL)
N. gonorrhoeae RNA, TMA: NOT DETECTED
T. vaginalis RNA, QL TMA: NOT DETECTED

## 2015-03-13 ENCOUNTER — Other Ambulatory Visit: Payer: Self-pay | Admitting: Certified Nurse Midwife

## 2015-03-15 ENCOUNTER — Emergency Department (HOSPITAL_BASED_OUTPATIENT_CLINIC_OR_DEPARTMENT_OTHER)
Admission: EM | Admit: 2015-03-15 | Discharge: 2015-03-15 | Disposition: A | Discharge status: Home / Self Care | Payer: Medicaid Other | Attending: Emergency Medicine | Admitting: Emergency Medicine

## 2015-03-15 ENCOUNTER — Encounter (HOSPITAL_BASED_OUTPATIENT_CLINIC_OR_DEPARTMENT_OTHER): Payer: Self-pay

## 2015-03-15 DIAGNOSIS — Z720 Tobacco use: Secondary | ICD-10-CM | POA: Diagnosis not present

## 2015-03-15 DIAGNOSIS — Z79899 Other long term (current) drug therapy: Secondary | ICD-10-CM | POA: Diagnosis not present

## 2015-03-15 DIAGNOSIS — E876 Hypokalemia: Secondary | ICD-10-CM | POA: Insufficient documentation

## 2015-03-15 DIAGNOSIS — M79662 Pain in left lower leg: Secondary | ICD-10-CM | POA: Insufficient documentation

## 2015-03-15 DIAGNOSIS — Z87448 Personal history of other diseases of urinary system: Secondary | ICD-10-CM | POA: Diagnosis not present

## 2015-03-15 DIAGNOSIS — R1110 Vomiting, unspecified: Secondary | ICD-10-CM | POA: Diagnosis present

## 2015-03-15 DIAGNOSIS — Z3202 Encounter for pregnancy test, result negative: Secondary | ICD-10-CM | POA: Diagnosis not present

## 2015-03-15 DIAGNOSIS — E860 Dehydration: Secondary | ICD-10-CM | POA: Diagnosis not present

## 2015-03-15 LAB — BASIC METABOLIC PANEL
Anion gap: 16 — ABNORMAL HIGH (ref 5–15)
BUN: 26 mg/dL — ABNORMAL HIGH (ref 6–20)
CO2: 32 mmol/L (ref 22–32)
Calcium: 9.9 mg/dL (ref 8.9–10.3)
Chloride: 85 mmol/L — ABNORMAL LOW (ref 101–111)
Creatinine, Ser: 1.09 mg/dL — ABNORMAL HIGH (ref 0.44–1.00)
GFR calc Af Amer: >60 mL/min (ref >60)
GFR calc non Af Amer: >60 mL/min (ref >60)
Glucose, Bld: 126 mg/dL — ABNORMAL HIGH (ref 65–99)
Potassium: 2.5 mmol/L — CL (ref 3.5–5.1)
Sodium: 133 mmol/L — ABNORMAL LOW (ref 135–145)

## 2015-03-15 LAB — CBC WITH DIFFERENTIAL/PLATELET
Basophils Absolute: 0.1 10*3/uL (ref 0.0–0.1)
Basophils Relative: 0 % (ref 0–1)
Eosinophils Absolute: 0.2 10*3/uL (ref 0.0–0.7)
Eosinophils Relative: 1 % (ref 0–5)
HCT: 51.9 % — ABNORMAL HIGH (ref 36.0–46.0)
Hemoglobin: 18.5 g/dL — ABNORMAL HIGH (ref 12.0–15.0)
Lymphocytes Relative: 18 % (ref 12–46)
Lymphs Abs: 3.5 10*3/uL (ref 0.7–4.0)
MCH: 32.7 pg (ref 26.0–34.0)
MCHC: 35.6 g/dL (ref 30.0–36.0)
MCV: 91.9 fL (ref 78.0–100.0)
Monocytes Absolute: 2.2 10*3/uL — ABNORMAL HIGH (ref 0.1–1.0)
Monocytes Relative: 11 % (ref 3–12)
Neutro Abs: 13.5 10*3/uL — ABNORMAL HIGH (ref 1.7–7.7)
Neutrophils Relative %: 70 % (ref 43–77)
Platelets: 392 10*3/uL (ref 150–400)
RBC: 5.65 MIL/uL — ABNORMAL HIGH (ref 3.87–5.11)
RDW: 13.2 % (ref 11.5–15.5)
WBC: 19.4 10*3/uL — ABNORMAL HIGH (ref 4.0–10.5)

## 2015-03-15 LAB — URINALYSIS, ROUTINE W REFLEX MICROSCOPIC
Bilirubin Urine: NEGATIVE
Glucose, UA: NEGATIVE mg/dL
Ketones, ur: NEGATIVE mg/dL
Leukocytes, UA: NEGATIVE
Nitrite: NEGATIVE
Protein, ur: NEGATIVE mg/dL
Specific Gravity, Urine: 1.008 (ref 1.005–1.030)
Urobilinogen, UA: 0.2 mg/dL (ref 0.0–1.0)
pH: 6 (ref 5.0–8.0)

## 2015-03-15 LAB — URINE MICROSCOPIC-ADD ON

## 2015-03-15 LAB — PREGNANCY, URINE: Preg Test, Ur: NEGATIVE

## 2015-03-15 LAB — MAGNESIUM: Magnesium: 2.7 mg/dL — ABNORMAL HIGH (ref 1.7–2.4)

## 2015-03-15 MED ORDER — SODIUM CHLORIDE 0.9 % IV BOLUS (SEPSIS)
1000.0000 mL | Freq: Once | INTRAVENOUS | Status: AC
  Administered 2015-03-15: 1000 mL via INTRAVENOUS

## 2015-03-15 MED ORDER — POTASSIUM CHLORIDE CRYS ER 20 MEQ PO TBCR
40.0000 meq | EXTENDED_RELEASE_TABLET | Freq: Once | ORAL | Status: AC
  Administered 2015-03-15: 40 meq via ORAL
  Filled 2015-03-15: qty 2

## 2015-03-15 MED ORDER — POTASSIUM CHLORIDE ER 20 MEQ PO TBCR: 40.0000 meq | EXTENDED_RELEASE_TABLET | Freq: Two times a day (BID) | ORAL | Status: DC

## 2015-03-15 NOTE — ED Notes (Signed)
C/o "cyclic vomiting" Tuesday thru Wednesday-was seen by PCP and GI-states she was told K+ 3.2 with WBC 16.6 this week-states she feels she needs IVF and K+

## 2015-03-15 NOTE — ED Provider Notes (Signed)
CSN: [id number]     Arrival date & time 03/15/15  2030 History  This chart was scribed for Evelina Bucy, MD by Julien Nordmann, ED Scribe. This patient was seen in room MH02/MH02 and the patient's care was started at 9:19 PM.    Chief Complaint  Patient presents with  . Vomiting      Patient is a 34 y.o. female presenting with vomiting. The history is provided by the patient. No language interpreter was used.  Emesis Severity:  Mild Duration:  4 days Timing:  Intermittent Quality:  Unable to specify Able to tolerate:  Liquids and solids Progression:  Unchanged Chronicity:  Recurrent Relieved by:  Nothing Worsened by:  Nothing tried Ineffective treatments:  None tried Associated symptoms: myalgias   Associated symptoms: no cough, no diarrhea and no fever    HPI Comments: Natalie Joseph is a 34 y.o. female who has a hx of cyclic vomiting syndrome presents to the Emergency Department complaining of intermittent, gradual worsening vomiting onset 4 days ago. She has associated cramping in her left calf that radiates up her leg Pt notes having a "cycling vomiting attack" 4 days ago. She notes the last time she vomited was Wednesday at 4 am. She was seen by her PCP Dr. Daylene Posey and PA and states that her potassium levels were decreased. Pt denies nausea, diarrhea, dysuria, abdominal surgeries, vaginal pain, shortness of breath, cough.   Past Medical History  Diagnosis Date  . Renal disorder   . Cyclic vomiting syndrome    History reviewed. No pertinent past surgical history. Family History  Problem Relation Age of Onset  . Hypertension Mother   . Hyperlipidemia Mother   . Kidney disease Father   . Hypertension Father   . Depression Father   . Diabetes Father   . Heart disease Father   . Cancer Paternal Aunt     breast  . Diabetes Paternal Aunt   . Stroke Maternal Grandmother   . Cancer Maternal Grandfather     lung  . Stroke Paternal Grandmother   . Heart disease  Paternal Grandfather    History  Substance Use Topics  . Smoking status: Current Every Day Smoker -- 1.50 packs/day for 17 years  . Smokeless tobacco: Never Used  . Alcohol Use: No   OB History    No data available     Review of Systems  Constitutional: Negative for fever.  Respiratory: Negative for cough and shortness of breath.   Gastrointestinal: Positive for vomiting. Negative for nausea and diarrhea.  Genitourinary: Negative for vaginal pain.  Musculoskeletal: Positive for myalgias.  All other systems reviewed and are negative.     Allergies  Septra ds and Sulfa antibiotics  Home Medications   Prior to Admission medications   Medication Sig Start Date End Date Taking? Authorizing Provider  ALPRAZolam (XANAX) 0.25 MG tablet Take 0.25 mg by mouth 2 (two) times daily.     Daylene Posey, FNP  cyclobenzaprine (FLEXERIL) 10 MG tablet Take 10 mg by mouth 3 (three) times daily as needed for muscle spasms.    Daylene Posey, FNP  docusate sodium (COLACE) 100 MG capsule Take 1 capsule (100 mg total) by mouth every 12 (twelve) hours. 01/20/15   Julianne Rice, MD  fluconazole (DIFLUCAN) 100 MG tablet Take 1 tablet (100 mg total) by mouth once. Repeat dose in 48-72 hour. 03/06/15   Rachelle A Denney, CNM  ibuprofen (ADVIL,MOTRIN) 200 MG tablet Take 800 mg by mouth every 6 (six)  hours as needed. For pain     Historical Provider, MD  levonorgestrel-ethinyl estradiol (LEVORA 0.15/30, 28,) 0.15-30 MG-MCG tablet Take 1 tablet by mouth daily. 06/13/14   Lahoma Crocker, MD  metoCLOPramide (REGLAN) 10 MG tablet Take 10 mg by mouth 4 (four) times daily.    Historical Provider, MD  ondansetron (ZOFRAN) 4 MG tablet Take 4 mg by mouth once.     Daylene Posey, FNP  Potassium Chloride ER 20 MEQ TBCR Take 20 mEq by mouth daily. Patient not taking: Reported on 03/06/2015 01/16/14   Dorie Rank, MD  promethazine (PHENERGAN) 12.5 MG tablet Take 12.5 mg by mouth every 6 (six) hours as needed for nausea  or vomiting.    Historical Provider, MD  TRINESSA LO 0.18/0.215/0.25 MG-25 MCG tab TAKE 1 TABLET BY MOUTH EVERY DAY 03/07/15   Shelly Bombard, MD   Triage vitals: BP 133/92 mmHg  Pulse 140  Temp(Src) 99.7 F (37.6 C) (Oral)  Resp 18  Ht 5\' 5"  (1.651 m)  Wt 127 lb (57.607 kg)  BMI 21.13 kg/m2  SpO2 98%  LMP 03/14/2015 Physical Exam  Constitutional: She is oriented to person, place, and time. She appears well-developed and well-nourished. No distress.  HENT:  Head: Normocephalic and atraumatic.  Mouth/Throat: Oropharynx is clear and moist.  Eyes: EOM are normal. Pupils are equal, round, and reactive to light.  Neck: Normal range of motion. Neck supple.  Cardiovascular: Normal rate and regular rhythm.  Exam reveals no friction rub.   No murmur heard. Pulmonary/Chest: Effort normal and breath sounds normal. No respiratory distress. She has no wheezes. She has no rales.  Abdominal: Soft. She exhibits no distension. There is no tenderness. There is no rebound.  Musculoskeletal: Normal range of motion. She exhibits no edema.  Neurological: She is alert and oriented to person, place, and time. No cranial nerve deficit. She exhibits normal muscle tone. Coordination normal.  Skin: No rash noted. She is not diaphoretic.  Nursing note and vitals reviewed.   ED Course  Procedures  DIAGNOSTIC STUDIES: Oxygen Saturation is 98% on RA, normal by my interpretation.  COORDINATION OF CARE:  9:25 PM Discussed treatment plan which includes IV fluids with pt at bedside and pt agreed to plan.  Labs Review  Labs Reviewed  CBC WITH DIFFERENTIAL/PLATELET - Abnormal; Notable for the following:    WBC 19.4 (*)    RBC 5.65 (*)    Hemoglobin 18.5 (*)    HCT 51.9 (*)    Neutro Abs 13.5 (*)    Monocytes Absolute 2.2 (*)    All other components within normal limits  PREGNANCY, URINE  URINALYSIS, ROUTINE W REFLEX MICROSCOPIC (NOT AT [facility])  BASIC METABOLIC PANEL    Imaging Review No results  found.   EKG Interpretation None      MDM   Final diagnoses:  Hypokalemia  Dehydration    34 year old female with history of cyclic vomiting. Seen by her PCP today, was told she had hypokalemia. She has persistent cramping in her left leg. She has no nausea or vomiting at this time. She stated she didn't just before coming here. She is well appearing. Will rehydrate with 2 L normal saline. She is fairly tachycardic here upon arrival. K is 2.5, given 40 mEq here and will give 2 days worth of 80 mEq divided into 2 doses for replenishment.Marland Kitchen Heart rate greatly improved after fluids.  I personally performed the services described in this documentation, which was scribed in my presence. The recorded  information has been reviewed and is accurate.    Evelina Bucy, MD 03/15/15 [phone]

## 2015-03-15 NOTE — Discharge Instructions (Signed)
Dehydration, Adult Dehydration is when you lose more fluids from the body than you take in. Vital organs like the kidneys, brain, and heart cannot function without a proper amount of fluids and salt. Any loss of fluids from the body can cause dehydration.  CAUSES   Vomiting.  Diarrhea.  Excessive sweating.  Excessive urine output.  Fever. SYMPTOMS  Mild dehydration  Thirst.  Dry lips.  Slightly dry mouth. Moderate dehydration  Very dry mouth.  Sunken eyes.  Skin does not bounce back quickly when lightly pinched and released.  Dark urine and decreased urine production.  Decreased tear production.  Headache. Severe dehydration  Very dry mouth.  Extreme thirst.  Rapid, weak pulse (more than 100 beats per minute at rest).  Cold hands and feet.  Not able to sweat in spite of heat and temperature.  Rapid breathing.  Blue lips.  Confusion and lethargy.  Difficulty being awakened.  Minimal urine production.  No tears. DIAGNOSIS  Your caregiver will diagnose dehydration based on your symptoms and your exam. Blood and urine tests will help confirm the diagnosis. The diagnostic evaluation should also identify the cause of dehydration. TREATMENT  Treatment of mild or moderate dehydration can often be done at home by increasing the amount of fluids that you drink. It is best to drink small amounts of fluid more often. Drinking too much at one time can make vomiting worse. Refer to the home care instructions below. Severe dehydration needs to be treated at the hospital where you will probably be given intravenous (IV) fluids that contain water and electrolytes. HOME CARE INSTRUCTIONS   Ask your caregiver about specific rehydration instructions.  Drink enough fluids to keep your urine clear or pale yellow.  Drink small amounts frequently if you have nausea and vomiting.  Eat as you normally do.  Avoid:  Foods or drinks high in sugar.  Carbonated  drinks.  Juice.  Extremely hot or cold fluids.  Drinks with caffeine.  Fatty, greasy foods.  Alcohol.  Tobacco.  Overeating.  Gelatin desserts.  Wash your hands well to avoid spreading bacteria and viruses.  Only take over-the-counter or prescription medicines for pain, discomfort, or fever as directed by your caregiver.  Ask your caregiver if you should continue all prescribed and over-the-counter medicines.  Keep all follow-up appointments with your caregiver. SEEK MEDICAL CARE IF:  You have abdominal pain and it increases or stays in one area (localizes).  You have a rash, stiff neck, or severe headache.  You are irritable, sleepy, or difficult to awaken.  You are weak, dizzy, or extremely thirsty. SEEK IMMEDIATE MEDICAL CARE IF:   You are unable to keep fluids down or you get worse despite treatment.  You have frequent episodes of vomiting or diarrhea.  You have blood or green matter (bile) in your vomit.  You have blood in your stool or your stool looks black and tarry.  You have not urinated in 6 to 8 hours, or you have only urinated a small amount of very dark urine.  You have a fever.  You faint. MAKE SURE YOU:   Understand these instructions.  Will watch your condition.  Will get help right away if you are not doing well or get worse. Document Released: 08/24/2005 Document Revised: 11/16/2011 Document Reviewed: 04/13/2011 ExitCare Patient Information 2015 [employer]. This information is not intended to replace advice given to you by your health care provider. Make sure you discuss any questions you have with your health care   provider. Hypokalemia Hypokalemia means that the amount of potassium in the blood is lower than normal.Potassium is a chemical, called an electrolyte, that helps regulate the amount of fluid in the body. It also stimulates muscle contraction and helps nerves function properly.Most of the body's potassium is inside of  cells, and only a very small amount is in the blood. Because the amount in the blood is so small, minor changes can be life-threatening. CAUSES  Antibiotics.  Diarrhea or vomiting.  Using laxatives too much, which can cause diarrhea.  Chronic kidney disease.  Water pills (diuretics).  Eating disorders (bulimia).  Low magnesium level.  Sweating a lot. SIGNS AND SYMPTOMS  Weakness.  Constipation.  Fatigue.  Muscle cramps.  Mental confusion.  Skipped heartbeats or irregular heartbeat (palpitations).  Tingling or numbness. DIAGNOSIS  Your health care provider can diagnose hypokalemia with blood tests. In addition to checking your potassium level, your health care provider may also check other lab tests. TREATMENT Hypokalemia can be treated with potassium supplements taken by mouth or adjustments in your current medicines. If your potassium level is very low, you may need to get potassium through a vein (IV) and be monitored in the hospital. A diet high in potassium is also helpful. Foods high in potassium are:  Nuts, such as peanuts and pistachios.  Seeds, such as sunflower seeds and pumpkin seeds.  Peas, lentils, and lima beans.  Whole grain and bran cereals and breads.  Fresh fruit and vegetables, such as apricots, avocado, bananas, cantaloupe, kiwi, oranges, tomatoes, asparagus, and potatoes.  Orange and tomato juices.  Red meats.  Fruit yogurt. HOME CARE INSTRUCTIONS  Take all medicines as prescribed by your health care provider.  Maintain a healthy diet by including nutritious food, such as fruits, vegetables, nuts, whole grains, and lean meats.  If you are taking a laxative, be sure to follow the directions on the label. SEEK MEDICAL CARE IF:  Your weakness gets worse.  You feel your heart pounding or racing.  You are vomiting or having diarrhea.  You are diabetic and having trouble keeping your blood glucose in the normal range. SEEK IMMEDIATE  MEDICAL CARE IF:  You have chest pain, shortness of breath, or dizziness.  You are vomiting or having diarrhea for more than 2 days.  You faint. MAKE SURE YOU:   Understand these instructions.  Will watch your condition.  Will get help right away if you are not doing well or get worse. Document Released: 08/24/2005 Document Revised: 06/14/2013 Document Reviewed: 02/24/2013 ExitCare Patient Information 2015 [employer]. This information is not intended to replace advice given to you by your health care provider. Make sure you discuss any questions you have with your health care provider.  

## 2015-03-15 NOTE — ED Notes (Signed)
MD at bedside. 

## 2015-03-22 ENCOUNTER — Other Ambulatory Visit: Payer: Self-pay | Admitting: Certified Nurse Midwife

## 2015-03-22 DIAGNOSIS — R7303 Prediabetes: Secondary | ICD-10-CM

## 2015-05-27 ENCOUNTER — Encounter: Payer: Self-pay | Admitting: Skilled Nursing Facility1

## 2015-05-27 ENCOUNTER — Encounter: Payer: Medicaid Other | Attending: Certified Nurse Midwife | Admitting: Skilled Nursing Facility1

## 2015-05-27 VITALS — Ht 64.0 in | Wt 137.0 lb

## 2015-05-27 DIAGNOSIS — R7309 Other abnormal glucose: Secondary | ICD-10-CM | POA: Diagnosis not present

## 2015-05-27 DIAGNOSIS — Z713 Dietary counseling and surveillance: Secondary | ICD-10-CM | POA: Diagnosis not present

## 2015-05-27 DIAGNOSIS — R7303 Prediabetes: Secondary | ICD-10-CM

## 2015-05-27 NOTE — Progress Notes (Signed)
Medical Nutrition Therapy:  Appt start time: 1100 end time:  1200.   Assessment:  Primary concerns today: referred for prediabetes. 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 Pt states she throws up when she eats pizza-she eats the entire pizza. Pt states she has Cyclic vomiting syndrome: Pt states she is light sensitive during this time and needs to be in a dark room, with 24-72 hours of vomiting then she sits in a boiling hot bathtub pouring cold water on her face-vomiting attacks possibly correlated with stress the patient states. Pt states  she has leg pain from these vomit attacks. Pt states she Ate 1 dozen deviled eggs. Pt states she got a endoscopy 2 years ago with no findings. Pt states Since february in 12-16-07 she has been vomiting (dx as having cyclic vomiting syndrome) but has been vomiting on the first day of her period since her first period when she was 34 years old-she admits to overindulging on those days as well. Pts mother states Natalie Joseph is on the toilet for 30 minutes and pt states she is not constipated she is just going for that long-Natalie Joseph states everything she eats comes out.. 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 Pt states she has a 6 pack and worked out a lot and then her doctor gave her ensure which caused her to "blow up to 110 pounds". Pts mother tells a story-the kids terrible to Natalie Joseph in school and one time a child told her milk makes her fat and she should not drink milk so since then Natalie Joseph does not drink milk.. Pt states she is afraid to be fat and fears obesity. Pts father died 12/16/2006) from kidney disease and was obese with type 1 diabetes. Pt states she had acute kidney failure and was Told she was Natalie Joseph and told she needed a new kidney and told they (the hospital staff) wanted to call the police ([id number]). Pt states she has anxiety.  Pt does have a child which is an 66 year old girl.  Pt  states her entire life has been rushed for time-which is why she does not eat more than one meal a day because she does not have time. Pt states if she eats a salad at a restaurant she has to have a glass of wine but that is the only alcohol she drinks and she does smoke.Marland Kitchen  Pts Mother states she was healed by nutritionist in 12-16-75 wanted the dietitian to take a hair sample to know what Natalie Joseph was deficient in.Pt avoids all fast food. Pt states she eats anything from fast food it is in child sized portiions.Pt states Red meat makes her physically disgusted-raw meat disgusts her. Pt does not put salt on anything. Pt excessively chews gum and used to constantly chew/suck on hard candies. Due to a plethora of information given by the pt there is evidence to support a possible eating disorder. This dietitian made her follow up appoinment with Natalie Ang MS,RD,CSP, CDE, LDN which works with disordered eating patients regularly and would be of tremendous help to Natalie Joseph.  Preferred Learning Style:   No preference indicated   Learning Readiness:  Not ready MEDICATIONS: See List   DIETARY INTAKE:  Usual eating pattern includes 1 meals and 0 snacks per day.  Everyday foods include none stated.  Avoided foods include corn, egg, pizza, shell fish, corn muffins, corn chips, red meat.  24-hr recall:  B ( AM): smoke and not eat Snk ( AM): none L ( PM): none-----fast food Snk ( PM): cookie and smoke D ( PM): chicken fajitas  Snk ( PM): yogurt, cereal Beverages: water, cranberry juice, gatorade  Usual physical activity: ADL's  Estimated energy needs: 1800 calories 200 g carbohydrates 135 g protein 50 g fat  Progress Towards Goal(s):  In progress.   Nutritional Diagnosis:  NB-1.1 Food and nutrition-related knowledge deficit As related to no prior nutrition education from a nutrition professional.  As evidenced by pt report and 24 hour recall..    Intervention:  Nutrition counseling.  Dietitian allowed for the pt to describe her signs and symptoms. Dietitian educated the pt on how excessive vomiting effects the body and some remedies for her to accomplish after vomiting to replete her electrolytes.  Teaching Method Utilized:  Auditory  Handouts given during visit include:  MyPlate  Barriers to learning/adherence to lifestyle change: unawareness of mental/behavioral connection with eating  Demonstrated degree of understanding via:  Teach Back   Monitoring/Evaluation:  Dietary intake, exercise, relevant labs, and body weight prn.

## 2015-05-28 ENCOUNTER — Telehealth: Payer: Self-pay

## 2015-05-28 ENCOUNTER — Other Ambulatory Visit: Payer: Self-pay | Admitting: Certified Nurse Midwife

## 2015-05-28 DIAGNOSIS — F5002 Anorexia nervosa, binge eating/purging type: Secondary | ICD-10-CM

## 2015-05-28 NOTE — Telephone Encounter (Signed)
patient has appt with Dr. Daylene Posey at [facility] on 06/04/15 at 11am

## 2015-05-28 NOTE — Telephone Encounter (Signed)
needs referral to pcp, but [facility] on her card, left vm asking patient if she goes to them, if not will need to change her card, and I can give suggestions to her - left message for her to call me

## 2015-06-27 ENCOUNTER — Ambulatory Visit: Payer: Medicaid Other | Admitting: *Deleted

## 2015-07-19 ENCOUNTER — Encounter: Payer: Self-pay | Admitting: Certified Nurse Midwife

## 2015-07-19 ENCOUNTER — Ambulatory Visit (INDEPENDENT_AMBULATORY_CARE_PROVIDER_SITE_OTHER): Payer: Medicaid Other | Admitting: Certified Nurse Midwife

## 2015-07-19 VITALS — BP 127/85 | HR 79 | Temp 97.7°F | Wt 143.0 lb

## 2015-07-19 DIAGNOSIS — Z113 Encounter for screening for infections with a predominantly sexual mode of transmission: Secondary | ICD-10-CM

## 2015-07-19 DIAGNOSIS — IMO0002 Reserved for concepts with insufficient information to code with codable children: Secondary | ICD-10-CM

## 2015-07-19 LAB — RPR

## 2015-07-19 NOTE — Progress Notes (Signed)
Patient ID: Natalie Joseph, female   DOB: [birth year], 34 y.o.   MRN: [id number]  Chief Complaint  Patient presents with  . Problem    Possible retained tampon.    HPI Natalie Joseph is a 34 y.o. female.  Thinks that she has a retained tampon with odorous discharge.  Is sexually active and desires to become pregnant soon.  Is not sure if she is currently pregnant.  Desires full STD testing.  Discussed quitting smoking, states she is trying.    HPI  Past Medical History  Diagnosis Date  . Renal disorder   . Cyclic vomiting syndrome     History reviewed. No pertinent past surgical history.  Family History  Problem Relation Age of Onset  . Hypertension Mother   . Hyperlipidemia Mother   . Kidney disease Father   . Hypertension Father   . Depression Father   . Diabetes Father   . Heart disease Father   . Cancer Paternal Aunt     breast  . Diabetes Paternal Aunt   . Stroke Maternal Grandmother   . Cancer Maternal Grandfather     lung  . Stroke Paternal Grandmother   . Heart disease Paternal Grandfather     Social History Social History  Substance Use Topics  . Smoking status: Current Every Day Smoker -- 1.50 packs/day for 17 years  . Smokeless tobacco: Never Used  . Alcohol Use: No    Allergies  Allergen Reactions  . Septra Ds [Sulfamethoxazole W/Trimethoprim (Co-Trimoxazole)] Nausea And Vomiting  . Sulfa Antibiotics Nausea And Vomiting    Current Outpatient Prescriptions  Medication Sig Dispense Refill  . ALPRAZolam (XANAX) 0.25 MG tablet Take 0.25 mg by mouth 2 (two) times daily.     . cyclobenzaprine (FLEXERIL) 10 MG tablet Take 10 mg by mouth 3 (three) times daily as needed for muscle spasms.    Marland Kitchen docusate sodium (COLACE) 100 MG capsule Take 1 capsule (100 mg total) by mouth every 12 (twelve) hours. 60 capsule 0  . fluconazole (DIFLUCAN) 100 MG tablet Take 1 tablet (100 mg total) by mouth once. Repeat dose in 48-72 hour. 3 tablet 0  . ibuprofen  (ADVIL,MOTRIN) 200 MG tablet Take 800 mg by mouth every 6 (six) hours as needed. For pain     . levonorgestrel-ethinyl estradiol (LEVORA 0.15/30, 28,) 0.15-30 MG-MCG tablet Take 1 tablet by mouth daily. 1 Package 11  . metoCLOPramide (REGLAN) 10 MG tablet Take 10 mg by mouth 4 (four) times daily.    . ondansetron (ZOFRAN) 4 MG tablet Take 4 mg by mouth once.     . Potassium Chloride ER 20 MEQ TBCR Take 40 mEq by mouth 2 (two) times daily. 16 tablet 0  . promethazine (PHENERGAN) 12.5 MG tablet Take 12.5 mg by mouth every 6 (six) hours as needed for nausea or vomiting.    Marland Kitchen TRINESSA LO 0.18/0.215/0.25 MG-25 MCG tab TAKE 1 TABLET BY MOUTH EVERY DAY 84 tablet 11   No current facility-administered medications for this visit.    Review of Systems Review of Systems Constitutional: negative for fatigue and weight loss Respiratory: negative for cough and wheezing Cardiovascular: negative for chest pain, fatigue and palpitations Gastrointestinal: negative for abdominal pain and change in bowel habits Genitourinary:negative Integument/breast: negative for nipple discharge Musculoskeletal:negative for myalgias Neurological: negative for gait problems and tremors Behavioral/Psych: negative for abusive relationship, depression Endocrine: negative for temperature intolerance     Blood pressure 127/85, pulse 79, temperature 97.7 F (36.5 C), weight  143 lb (64.864 kg), last menstrual period 06/21/2015.  Physical Exam Physical Exam General:   alert  Skin:   no rash or abnormalities  Lungs:   clear to auscultation bilaterally  Heart:   regular rate and rhythm, S1, S2 normal, no murmur, click, rub or gallop  Breasts:   deferred  Abdomen:  normal findings: no organomegaly, soft, non-tender and no hernia  Pelvis:  External genitalia: normal general appearance Urinary system: urethral meatus normal and bladder without fullness, nontender Vaginal: normal without tenderness, induration or masses Cervix:  normal appearance Adnexa: normal bimanual exam Uterus: anteverted and non-tender, normal size    50% of 25 min visit spent on counseling and coordination of care.   Data Reviewed Previous medical hx, labs, meds  Assessment     STD screening exam Preconception counseling Smoking cessation counseling     Plan    Orders Placed This Encounter  Procedures  . SureSwab, Vaginosis/Vaginitis Plus  . HIV antibody (with reflex)  . Hepatitis B surface antigen  . RPR  . Hepatitis C antibody  . Varicella zoster antibody, IgG  . HSV(herpes simplex vrs) 1+2 ab-IgM  . hCG, serum, qualitative   No orders of the defined types were placed in this encounter.    Possible management options include: contraception, mental health counseling Follow up as needed.

## 2015-07-20 LAB — HIV ANTIBODY (ROUTINE TESTING W REFLEX): HIV 1&2 Ab, 4th Generation: NONREACTIVE

## 2015-07-20 LAB — HCG, SERUM, QUALITATIVE: Preg, Serum: NEGATIVE

## 2015-07-20 LAB — HEPATITIS B SURFACE ANTIGEN: Hepatitis B Surface Ag: NEGATIVE

## 2015-07-20 LAB — HEPATITIS C ANTIBODY: HCV Ab: NEGATIVE

## 2015-07-22 LAB — VARICELLA ZOSTER ANTIBODY, IGG: Varicella IgG: 1815 Index — ABNORMAL HIGH (ref <135.00)

## 2015-07-23 LAB — SURESWAB, VAGINOSIS/VAGINITIS PLUS
Atopobium vaginae: NOT DETECTED Log (cells/mL)
BV CATEGORY: UNDETERMINED — AB
C. albicans, DNA: DETECTED — AB
C. glabrata, DNA: NOT DETECTED
C. parapsilosis, DNA: NOT DETECTED
C. trachomatis RNA, TMA: NOT DETECTED
C. tropicalis, DNA: NOT DETECTED
Gardnerella vaginalis: 6.2 Log (cells/mL)
LACTOBACILLUS SPECIES: 7.4 Log (cells/mL)
MEGASPHAERA SPECIES: NOT DETECTED Log (cells/mL)
N. gonorrhoeae RNA, TMA: NOT DETECTED
T. vaginalis RNA, QL TMA: NOT DETECTED

## 2015-07-23 LAB — HSV(HERPES SIMPLEX VRS) I + II AB-IGM: Herpes Simplex Vrs I&II-IgM Ab (EIA): 3.32 INDEX — ABNORMAL HIGH

## 2015-07-24 ENCOUNTER — Other Ambulatory Visit: Payer: Self-pay | Admitting: Certified Nurse Midwife

## 2015-07-24 DIAGNOSIS — B9689 Other specified bacterial agents as the cause of diseases classified elsewhere: Secondary | ICD-10-CM

## 2015-07-24 DIAGNOSIS — B373 Candidiasis of vulva and vagina: Secondary | ICD-10-CM

## 2015-07-24 DIAGNOSIS — N760 Acute vaginitis: Secondary | ICD-10-CM

## 2015-07-24 DIAGNOSIS — B3731 Acute candidiasis of vulva and vagina: Secondary | ICD-10-CM

## 2015-07-24 MED ORDER — TERCONAZOLE 0.4 % VA CREA: 1.0000 | TOPICAL_CREAM | Freq: Every day | VAGINAL | Status: DC

## 2015-07-24 MED ORDER — FLUCONAZOLE 100 MG PO TABS: 100.0000 mg | ORAL_TABLET | Freq: Once | ORAL | Status: DC

## 2015-07-24 MED ORDER — METRONIDAZOLE 500 MG PO TABS: 500.0000 mg | ORAL_TABLET | Freq: Two times a day (BID) | ORAL | Status: DC

## 2015-08-26 DIAGNOSIS — F419 Anxiety disorder, unspecified: Secondary | ICD-10-CM | POA: Insufficient documentation

## 2015-09-16 ENCOUNTER — Telehealth: Payer: Self-pay | Admitting: Certified Nurse Midwife

## 2015-09-16 ENCOUNTER — Other Ambulatory Visit: Payer: Self-pay | Admitting: Certified Nurse Midwife

## 2015-09-17 NOTE — Telephone Encounter (Signed)
09/17/15 - Patient called and cancelled appt until her Medicaid is changed. brm

## 2015-09-20 ENCOUNTER — Ambulatory Visit (INDEPENDENT_AMBULATORY_CARE_PROVIDER_SITE_OTHER): Payer: Medicaid Other | Admitting: Certified Nurse Midwife

## 2015-09-20 ENCOUNTER — Ambulatory Visit: Payer: Medicaid Other | Admitting: Certified Nurse Midwife

## 2015-09-20 VITALS — BP 123/77 | HR 85 | Wt 145.0 lb

## 2015-09-20 DIAGNOSIS — N9489 Other specified conditions associated with female genital organs and menstrual cycle: Secondary | ICD-10-CM | POA: Diagnosis not present

## 2015-09-20 DIAGNOSIS — Z113 Encounter for screening for infections with a predominantly sexual mode of transmission: Secondary | ICD-10-CM

## 2015-09-20 DIAGNOSIS — N760 Acute vaginitis: Secondary | ICD-10-CM

## 2015-09-20 DIAGNOSIS — N898 Other specified noninflammatory disorders of vagina: Secondary | ICD-10-CM

## 2015-09-20 NOTE — Progress Notes (Signed)
Patient ID: Natalie Joseph, female   DOB: 1980/11/16, 35 y.o.   MRN: [id number]  Chief Complaint  Patient presents with  . Vaginitis    vaginal odor    HPI Natalie Joseph is a 35 y.o. female.  Vaginal odor after sexual intercourse.   Started her period last Friday.  Noticed the odor last Wednesday before her period after sexual intercourse.  The odor is the worst in the morning. Bathes twice a day.  Desires pregnancy.  Has not tried anything for the odor.  Denies any change in soaps or personal hygiene habits.  Desires to have progesterone after pregnancy due to frequent miscarriages.  Desires full std screening exam.     HPI  Past Medical History  Diagnosis Date  . Renal disorder   . Cyclic vomiting syndrome     No past surgical history on file.  Family History  Problem Relation Age of Onset  . Hypertension Mother   . Hyperlipidemia Mother   . Kidney disease Father   . Hypertension Father   . Depression Father   . Diabetes Father   . Heart disease Father   . Cancer Paternal Aunt     breast  . Diabetes Paternal Aunt   . Stroke Maternal Grandmother   . Cancer Maternal Grandfather     lung  . Stroke Paternal Grandmother   . Heart disease Paternal Grandfather     Social History Social History  Substance Use Topics  . Smoking status: Current Every Day Smoker -- 1.50 packs/day for 17 years  . Smokeless tobacco: Never Used  . Alcohol Use: No    Allergies  Allergen Reactions  . Septra Ds [Sulfamethoxazole W/Trimethoprim (Co-Trimoxazole)] Nausea And Vomiting  . Sulfa Antibiotics Nausea And Vomiting    Current Outpatient Prescriptions  Medication Sig Dispense Refill  . ALPRAZolam (XANAX) 0.25 MG tablet Take 0.25 mg by mouth 2 (two) times daily.     . cyclobenzaprine (FLEXERIL) 10 MG tablet Take 10 mg by mouth 3 (three) times daily as needed for muscle spasms.    Marland Kitchen docusate sodium (COLACE) 100 MG capsule Take 1 capsule (100 mg total) by mouth every 12 (twelve)  hours. 60 capsule 0  . fluconazole (DIFLUCAN) 100 MG tablet TAKE 1 TABLET BY MOUTH ONCE. REPEAT IN 48 TO 72 HOURS 3 tablet 0  . ibuprofen (ADVIL,MOTRIN) 200 MG tablet Take 800 mg by mouth every 6 (six) hours as needed. For pain     . levonorgestrel-ethinyl estradiol (LEVORA 0.15/30, 28,) 0.15-30 MG-MCG tablet Take 1 tablet by mouth daily. 1 Package 11  . metoCLOPramide (REGLAN) 10 MG tablet Take 10 mg by mouth 4 (four) times daily.    . metroNIDAZOLE (FLAGYL) 500 MG tablet TAKE 1 TABLET(500 MG) BY MOUTH TWICE DAILY 14 tablet 0  . ondansetron (ZOFRAN) 4 MG tablet Take 4 mg by mouth once.     . Potassium Chloride ER 20 MEQ TBCR Take 40 mEq by mouth 2 (two) times daily. 16 tablet 0  . promethazine (PHENERGAN) 12.5 MG tablet Take 12.5 mg by mouth every 6 (six) hours as needed for nausea or vomiting.    Marland Kitchen terconazole (TERAZOL 7) 0.4 % vaginal cream Place 1 applicator vaginally at bedtime. 45 g 0  . TRINESSA LO 0.18/0.215/0.25 MG-25 MCG tab TAKE 1 TABLET BY MOUTH EVERY DAY 84 tablet 11   No current facility-administered medications for this visit.    Review of Systems Review of Systems Constitutional: negative for fatigue and weight loss  Respiratory: negative for cough and wheezing Cardiovascular: negative for chest pain, fatigue and palpitations Gastrointestinal: negative for abdominal pain and change in bowel habits Genitourinary: + vaginal odor Integument/breast: negative for nipple discharge Musculoskeletal:negative for myalgias Neurological: negative for gait problems and tremors Behavioral/Psych: negative for abusive relationship, depression Endocrine: negative for temperature intolerance     Blood pressure 123/77, pulse 85, weight 145 lb (65.772 kg).  Physical Exam Physical Exam General:   alert  Skin:   no rash or abnormalities  Lungs:   clear to auscultation bilaterally  Heart:   regular rate and rhythm, S1, S2 normal, no murmur, click, rub or gallop  Breasts:   deferred   Abdomen:  normal findings: no organomegaly, soft, non-tender and no hernia  Pelvis:  External genitalia: normal general appearance Urinary system: urethral meatus normal and bladder without fullness, nontender Vaginal: normal without tenderness, induration or masses Cervix: no CMT Adnexa: normal bimanual exam Uterus: anteverted and non-tender, normal size    50% of 15 min visit spent on counseling and coordination of care.   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 SureSwab, Vaginosis/Vaginitis Plus   No orders of the defined types were placed in this encounter.     Possible management options include: treatment with boric acid, metrogel for 4-6 mo.  Follow up as needed.

## 2015-09-21 ENCOUNTER — Encounter: Payer: Self-pay | Admitting: Certified Nurse Midwife

## 2015-09-21 LAB — RPR

## 2015-09-21 LAB — HEPATITIS C ANTIBODY: HCV Ab: NEGATIVE

## 2015-09-21 LAB — HIV ANTIBODY (ROUTINE TESTING W REFLEX): HIV 1&2 Ab, 4th Generation: NONREACTIVE

## 2015-09-21 LAB — HEPATITIS B SURFACE ANTIGEN: Hepatitis B Surface Ag: NEGATIVE

## 2015-09-21 MED ORDER — TERCONAZOLE 0.4 % VA CREA: 1.0000 | TOPICAL_CREAM | Freq: Every day | VAGINAL | Status: DC

## 2015-09-21 MED ORDER — FLUCONAZOLE 100 MG PO TABS: 100.0000 mg | ORAL_TABLET | Freq: Once | ORAL | Status: DC

## 2015-09-21 MED ORDER — METRONIDAZOLE 500 MG PO TABS: 500.0000 mg | ORAL_TABLET | Freq: Two times a day (BID) | ORAL | Status: DC

## 2015-09-25 LAB — SURESWAB, VAGINOSIS/VAGINITIS PLUS
Atopobium vaginae: NOT DETECTED Log (cells/mL)
C. albicans, DNA: NOT DETECTED
C. glabrata, DNA: NOT DETECTED
C. parapsilosis, DNA: NOT DETECTED
C. trachomatis RNA, TMA: NOT DETECTED
C. tropicalis, DNA: NOT DETECTED
Gardnerella vaginalis: NOT DETECTED Log (cells/mL)
LACTOBACILLUS SPECIES: 7.8 Log (cells/mL)
MEGASPHAERA SPECIES: NOT DETECTED Log (cells/mL)
N. gonorrhoeae RNA, TMA: NOT DETECTED
T. vaginalis RNA, QL TMA: NOT DETECTED

## 2015-10-22 ENCOUNTER — Emergency Department (HOSPITAL_BASED_OUTPATIENT_CLINIC_OR_DEPARTMENT_OTHER)
Admission: EM | Admit: 2015-10-22 | Discharge: 2015-10-22 | Disposition: A | Discharge status: Home / Self Care | Payer: Medicaid Other | Attending: Emergency Medicine | Admitting: Emergency Medicine

## 2015-10-22 ENCOUNTER — Encounter (HOSPITAL_BASED_OUTPATIENT_CLINIC_OR_DEPARTMENT_OTHER): Payer: Self-pay | Admitting: *Deleted

## 2015-10-22 DIAGNOSIS — M542 Cervicalgia: Secondary | ICD-10-CM | POA: Insufficient documentation

## 2015-10-22 DIAGNOSIS — M25511 Pain in right shoulder: Secondary | ICD-10-CM | POA: Insufficient documentation

## 2015-10-22 DIAGNOSIS — Z87448 Personal history of other diseases of urinary system: Secondary | ICD-10-CM | POA: Insufficient documentation

## 2015-10-22 DIAGNOSIS — Z79899 Other long term (current) drug therapy: Secondary | ICD-10-CM | POA: Insufficient documentation

## 2015-10-22 DIAGNOSIS — Z8669 Personal history of other diseases of the nervous system and sense organs: Secondary | ICD-10-CM | POA: Insufficient documentation

## 2015-10-22 DIAGNOSIS — M25512 Pain in left shoulder: Secondary | ICD-10-CM | POA: Insufficient documentation

## 2015-10-22 DIAGNOSIS — R61 Generalized hyperhidrosis: Secondary | ICD-10-CM | POA: Insufficient documentation

## 2015-10-22 DIAGNOSIS — B349 Viral infection, unspecified: Secondary | ICD-10-CM

## 2015-10-22 DIAGNOSIS — Z3202 Encounter for pregnancy test, result negative: Secondary | ICD-10-CM | POA: Insufficient documentation

## 2015-10-22 DIAGNOSIS — F17210 Nicotine dependence, cigarettes, uncomplicated: Secondary | ICD-10-CM | POA: Insufficient documentation

## 2015-10-22 LAB — BASIC METABOLIC PANEL
Anion gap: 14 (ref 5–15)
BUN: 11 mg/dL (ref 6–20)
CO2: 23 mmol/L (ref 22–32)
Calcium: 9.4 mg/dL (ref 8.9–10.3)
Chloride: 99 mmol/L — ABNORMAL LOW (ref 101–111)
Creatinine, Ser: 0.74 mg/dL (ref 0.44–1.00)
GFR calc Af Amer: >60 mL/min (ref >60)
GFR calc non Af Amer: >60 mL/min (ref >60)
Glucose, Bld: 91 mg/dL (ref 65–99)
Potassium: 3.9 mmol/L (ref 3.5–5.1)
Sodium: 136 mmol/L (ref 135–145)

## 2015-10-22 LAB — PREGNANCY, URINE: Preg Test, Ur: NEGATIVE

## 2015-10-22 MED ORDER — ONDANSETRON HCL 4 MG PO TABS: 4.0000 mg | ORAL_TABLET | Freq: Four times a day (QID) | ORAL | Status: DC

## 2015-10-22 MED ORDER — ONDANSETRON 4 MG PO TBDP
4.0000 mg | ORAL_TABLET | Freq: Once | ORAL | Status: AC
  Administered 2015-10-22: 4 mg via ORAL
  Filled 2015-10-22: qty 1

## 2015-10-22 MED ORDER — PSEUDOEPHEDRINE-IBUPROFEN 30-200 MG PO TABS: 1.0000 | ORAL_TABLET | Freq: Two times a day (BID) | ORAL | Status: DC

## 2015-10-22 MED ORDER — KETOROLAC TROMETHAMINE 30 MG/ML IJ SOLN
30.0000 mg | Freq: Once | INTRAMUSCULAR | Status: AC
  Administered 2015-10-22: 30 mg via INTRAMUSCULAR
  Filled 2015-10-22: qty 1

## 2015-10-22 NOTE — Discharge Instructions (Signed)
Symptoms are likely due to a virus and should resolve on their own in the next 1-2 weeks. It is important if you take your medications as prescribed to help with her symptoms. Follow-up with your doctor in 1 week for reevaluation. Be sure to stay well hydrated and drink plenty of water, Gatorade or other electrolyte solution. Return to ED for new or worsening symptoms as we discussed.  Viral Infections A viral infection can be caused by different types of viruses.Most viral infections are not serious and resolve on their own. However, some infections may cause severe symptoms and may lead to further complications. SYMPTOMS Viruses can frequently cause:  Minor sore throat.  Aches and pains.  Headaches.  Runny nose.  Different types of rashes.  Watery eyes.  Tiredness.  Cough.  Loss of appetite.  Gastrointestinal infections, resulting in nausea, vomiting, and diarrhea. These symptoms do not respond to antibiotics because the infection is not caused by bacteria. However, you might catch a bacterial infection following the viral infection. This is sometimes called a "superinfection." Symptoms of such a bacterial infection may include:  Worsening sore throat with pus and difficulty swallowing.  Swollen neck glands.  Chills and a high or persistent fever.  Severe headache.  Tenderness over the sinuses.  Persistent overall ill feeling (malaise), muscle aches, and tiredness (fatigue).  Persistent cough.  Yellow, green, or brown mucus production with coughing. HOME CARE INSTRUCTIONS   Only take over-the-counter or prescription medicines for pain, discomfort, diarrhea, or fever as directed by your caregiver.  Drink enough water and fluids to keep your urine clear or pale yellow. Sports drinks can provide valuable electrolytes, sugars, and hydration.  Get plenty of rest and maintain proper nutrition. Soups and broths with crackers or rice are fine. SEEK IMMEDIATE MEDICAL CARE  IF:   You have severe headaches, shortness of breath, chest pain, neck pain, or an unusual rash.  You have uncontrolled vomiting, diarrhea, or you are unable to keep down fluids.  You or your child has an oral temperature above 102 F (38.9 C), not controlled by medicine.  Your baby is older than 3 months with a rectal temperature of 102 F (38.9 C) or higher.  Your baby is 69 months old or younger with a rectal temperature of 100.4 F (38 C) or higher. MAKE SURE YOU:   Understand these instructions.  Will watch your condition.  Will get help right away if you are not doing well or get worse.   This information is not intended to replace advice given to you by your health care provider. Make sure you discuss any questions you have with your health care provider.   Document Released: 06/03/2005 Document Revised: 11/16/2011 Document Reviewed: 01/30/2015 Elsevier Interactive Patient Education [employer].

## 2015-10-22 NOTE — ED Notes (Signed)
PT questioning why she did not get an IV with blood draw. Instructed pt that IV was not ordered. PA wants to see if pt can drink fluids.

## 2015-10-22 NOTE — ED Provider Notes (Signed)
CSN: [id number]     Arrival date & time 10/22/15  [id number] History   First MD Initiated Contact with Patient 10/22/15 0902     No chief complaint on file.    (Consider location/radiation/quality/duration/timing/severity/associated sxs/prior Treatment) HPI Natalie Joseph is a 35 y.o. female with a history of renal disorder,  cyclical vomiting syndrome, comes in for evaluation of flulike symptoms. Patient reports since Sunday morning she has experienced diffuse neck pain into her shoulders-that she attributes to sleeping wrong, runny nose, sore throat, cough, diaphoresis, body aches and nausea. Reports she has taken ibuprofen once without relief of her symptoms. Nothing seems to make her problem better or worse. She reports only having had one bottle of water since Sunday.  She denies any numbness or weakness, confusion, urinary symptoms, abdominal pain, vomiting. No other aggravating or alleviating factors. Did not get flu shot.   Past Medical History  Diagnosis Date  . Renal disorder   . Cyclic vomiting syndrome    History reviewed. No pertinent past surgical history. Family History  Problem Relation Age of Onset  . Hypertension Mother   . Hyperlipidemia Mother   . Kidney disease Father   . Hypertension Father   . Depression Father   . Diabetes Father   . Heart disease Father   . Cancer Paternal Aunt     breast  . Diabetes Paternal Aunt   . Stroke Maternal Grandmother   . Cancer Maternal Grandfather     lung  . Stroke Paternal Grandmother   . Heart disease Paternal Grandfather    Social History  Substance Use Topics  . Smoking status: Current Every Day Smoker -- 2.00 packs/day for 17 years    Types: Cigarettes  . Smokeless tobacco: Never Used  . Alcohol Use: No   OB History    No data available     Review of Systems A 10 point review of systems was completed and was negative except for pertinent positives and negatives as mentioned in the history of present illness      Allergies  Septra ds; Sulfa antibiotics; Eggs or egg-derived products; and Shellfish allergy  Home Medications   Prior to Admission medications   Medication Sig Start Date End Date Taking? Authorizing Provider  ALPRAZolam (XANAX) 0.25 MG tablet Take 0.25 mg by mouth 2 (two) times daily.    Yes Daylene Posey, FNP  cyclobenzaprine (FLEXERIL) 10 MG tablet Take 10 mg by mouth 3 (three) times daily as needed for muscle spasms.   Yes Daylene Posey, FNP  docusate sodium (COLACE) 100 MG capsule Take 1 capsule (100 mg total) by mouth every 12 (twelve) hours. 01/20/15  Yes Julianne Rice, MD  ibuprofen (ADVIL,MOTRIN) 200 MG tablet Take 800 mg by mouth every 6 (six) hours as needed. For pain    Yes Historical Provider, MD  Potassium Chloride ER 20 MEQ TBCR Take 40 mEq by mouth 2 (two) times daily. 03/15/15  Yes Evelina Bucy, MD  ondansetron (ZOFRAN) 4 MG tablet Take 1 tablet (4 mg total) by mouth every 6 (six) hours. 10/22/15   Comer Locket, PA-C  Pseudoephedrine-Ibuprofen 30-200 MG TABS Take 1 Dose by mouth 2 (two) times daily. 10/22/15   Comer Locket, PA-C   BP 121/70 mmHg  Pulse 94  Temp(Src) 98.4 F (36.9 C) (Oral)  Resp 16  Ht 5\' 4"  (1.626 m)  Wt 65.772 kg  BMI 24.88 kg/m2  SpO2 100%  LMP 10/09/2015 Physical Exam  Constitutional: She is oriented to person, place, and  time. She appears well-developed and well-nourished.  Mildly diaphoretic, but overall appears well.  HENT:  Head: Normocephalic and atraumatic.  Mildly erythematous posterior oropharynx, moist mucous membranes  Eyes: Conjunctivae are normal. Pupils are equal, round, and reactive to light. Right eye exhibits no discharge. Left eye exhibits no discharge. No scleral icterus.  Neck: Normal range of motion. Neck supple.  No meningismus or nuchal rigidity  Cardiovascular: Normal rate, regular rhythm and normal heart sounds.   No tachycardia on my exam, heart sounds normal  Pulmonary/Chest: Effort normal and breath  sounds normal. No respiratory distress. She has no wheezes. She has no rales.  Abdominal: Soft. There is no tenderness.  Musculoskeletal: Normal range of motion. She exhibits no edema or tenderness.  Neurological: She is alert and oriented to person, place, and time. No cranial nerve deficit. Coordination normal.  Cranial Nerves II-XII grossly intact. Motor strength and sensation baseline. Gait is baseline  Skin: Skin is warm and dry. No rash noted.  Psychiatric: She has a normal mood and affect.  Nursing note and vitals reviewed.   ED Course  Procedures (including critical care time) Labs Review Labs Reviewed  BASIC METABOLIC PANEL - Abnormal; Notable for the following:    Chloride 99 (*)    All other components within normal limits  PREGNANCY, URINE    Imaging Review No results found. I have personally reviewed and evaluated these images and lab results as part of my medical decision-making.   EKG Interpretation None     Meds given in ED:  Medications  ketorolac (TORADOL) 30 MG/ML injection 30 mg (30 mg Intramuscular Given 10/22/15 1012)  ondansetron (ZOFRAN-ODT) disintegrating tablet 4 mg (4 mg Oral Given 10/22/15 1012)    Discharge Medication List as of 10/22/2015 10:32 AM    START taking these medications   Details  ondansetron (ZOFRAN) 4 MG tablet Take 1 tablet (4 mg total) by mouth every 6 (six) hours., Starting 10/22/2015, Until Discontinued, Print    Pseudoephedrine-Ibuprofen 30-200 MG TABS Take 1 Dose by mouth 2 (two) times daily., Starting 10/22/2015, Until Discontinued, Print       Filed Vitals:   10/22/15 0838 10/22/15 1036  BP: 119/81 121/70  Pulse: 102 94  Temp: 98.4 F (36.9 C)   TempSrc: Oral   Resp: 18 16  Height: 5\' 4"  (1.626 m)   Weight: 65.772 kg   SpO2: 98% 100%    MDM  Patient with symptoms consistent with influenza-like illness.  Hemodynamically stable and at this time she is afebrile.  No signs of overt dehydration, tolerating PO's.   Lungs are clear. Due to patient's presentation and physical exam a chest x-ray was not ordered bc likely diagnosis of viral illness.  Due to patient's history of hypokalemia, We will obtain a BMP to evaluate for possible electrolyte abnormality. Discussed the cost versus benefit of Tamiflu treatment with the patient.  The patient understands that symptoms are greater than the recommended 24-48 hour window of treatment.  Patient will be discharged with instructions to orally hydrate, rest, and use over-the-counter medications such as anti-inflammatories ibuprofen and Aleve for muscle aches and Tylenol for fever.  Patient will also be given a cough suppressant. States that she feels much better after administration of Toradol and Zofran in the emergency department. Labs are unremarkable. Patient overall appears well, nontoxic, hemodynamically stable and afebrile and appropriate for outpatient follow-up. Follow-up with PCP in one week. She verbalizes understanding and agrees with this plan.  Final diagnoses:  Viral illness  Comer Locket, PA-C 10/22/15 1046  Leonard Schwartz, MD 10/30/15 0700

## 2015-10-22 NOTE — ED Notes (Signed)
Pt called at 1817 stating that RN who reviewed her discharge did not tell her that her medications where sent to pharmacy. Pt was under the impression that we called medications into her pharmacy at [facility]. Informed patient that we do not call in any medications into any pharmacy. Pt is requesting a hard copy of prescriptions which she was informed that they could not be provided until pharmacy opens back up in the am. Pt was extrememly upset stating that she would just come and "check back in as a patient and be treated again for nausea, since first doctor did not do anything to treat her or give her an IV anyways".

## 2015-10-22 NOTE — ED Notes (Signed)
C/o neck pain since Sunday morning, felt she slept wrong. C/o pain in neck going down back and now has h/a with sensitivity to light. Chills and sweating.

## 2015-10-22 NOTE — ED Notes (Signed)
PT talking continuously during triage assessment about needing an IV, where she has to be stuck and what size needle RN needs to use if needed. Pt instructed PA or MD will order and IV if needed.

## 2015-10-22 NOTE — ED Notes (Signed)
Pt questioning why she does not have an IV. States why is PA not treating her with fluids the way she is always treated when she has these sx. Pt also c/o nausea. Pt brought water from home and is drinking that. Pt also questioning if potassium is resulted in urine. Pt instructed that blood draw was to check potassium and urine is for urine preg. PA aware pt has nausea. Orders given.

## 2016-01-31 ENCOUNTER — Ambulatory Visit: Payer: Medicaid Other | Admitting: Certified Nurse Midwife

## 2016-02-05 ENCOUNTER — Ambulatory Visit: Payer: Medicaid Other | Admitting: Certified Nurse Midwife

## 2016-06-03 ENCOUNTER — Emergency Department (HOSPITAL_BASED_OUTPATIENT_CLINIC_OR_DEPARTMENT_OTHER)
Admission: EM | Admit: 2016-06-03 | Discharge: 2016-06-03 | Disposition: A | Discharge status: Home / Self Care | Payer: Medicaid Other | Attending: Emergency Medicine | Admitting: Emergency Medicine

## 2016-06-03 ENCOUNTER — Emergency Department (HOSPITAL_BASED_OUTPATIENT_CLINIC_OR_DEPARTMENT_OTHER): Payer: Medicaid Other

## 2016-06-03 ENCOUNTER — Encounter (HOSPITAL_BASED_OUTPATIENT_CLINIC_OR_DEPARTMENT_OTHER): Payer: Self-pay

## 2016-06-03 DIAGNOSIS — F17210 Nicotine dependence, cigarettes, uncomplicated: Secondary | ICD-10-CM | POA: Insufficient documentation

## 2016-06-03 DIAGNOSIS — R0789 Other chest pain: Secondary | ICD-10-CM | POA: Insufficient documentation

## 2016-06-03 DIAGNOSIS — E876 Hypokalemia: Secondary | ICD-10-CM | POA: Insufficient documentation

## 2016-06-03 DIAGNOSIS — R112 Nausea with vomiting, unspecified: Secondary | ICD-10-CM | POA: Diagnosis present

## 2016-06-03 DIAGNOSIS — R1115 Cyclical vomiting syndrome unrelated to migraine: Secondary | ICD-10-CM

## 2016-06-03 DIAGNOSIS — R079 Chest pain, unspecified: Secondary | ICD-10-CM

## 2016-06-03 HISTORY — DX: Panic disorder (episodic paroxysmal anxiety): F41.0

## 2016-06-03 HISTORY — DX: Myalgia, unspecified site: M79.10

## 2016-06-03 LAB — CBC
HCT: 46.3 % — ABNORMAL HIGH (ref 36.0–46.0)
Hemoglobin: 16.5 g/dL — ABNORMAL HIGH (ref 12.0–15.0)
MCH: 32.1 pg (ref 26.0–34.0)
MCHC: 35.6 g/dL (ref 30.0–36.0)
MCV: 90.1 fL (ref 78.0–100.0)
Platelets: 422 10*3/uL — ABNORMAL HIGH (ref 150–400)
RBC: 5.14 MIL/uL — ABNORMAL HIGH (ref 3.87–5.11)
RDW: 12.8 % (ref 11.5–15.5)
WBC: 14.5 10*3/uL — ABNORMAL HIGH (ref 4.0–10.5)

## 2016-06-03 LAB — COMPREHENSIVE METABOLIC PANEL
ALT: 13 U/L — ABNORMAL LOW (ref 14–54)
AST: 20 U/L (ref 15–41)
Albumin: 5.2 g/dL — ABNORMAL HIGH (ref 3.5–5.0)
Alkaline Phosphatase: 84 U/L (ref 38–126)
Anion gap: 16 — ABNORMAL HIGH (ref 5–15)
BUN: 19 mg/dL (ref 6–20)
CO2: 30 mmol/L (ref 22–32)
Calcium: 10.3 mg/dL (ref 8.9–10.3)
Chloride: 87 mmol/L — ABNORMAL LOW (ref 101–111)
Creatinine, Ser: 0.87 mg/dL (ref 0.44–1.00)
GFR calc Af Amer: >60 mL/min (ref >60)
GFR calc non Af Amer: >60 mL/min (ref >60)
Glucose, Bld: 80 mg/dL (ref 65–99)
Potassium: 2.4 mmol/L — CL (ref 3.5–5.1)
Sodium: 133 mmol/L — ABNORMAL LOW (ref 135–145)
Total Bilirubin: 1.1 mg/dL (ref 0.3–1.2)
Total Protein: 8.7 g/dL — ABNORMAL HIGH (ref 6.5–8.1)

## 2016-06-03 LAB — PREGNANCY, URINE: Preg Test, Ur: NEGATIVE

## 2016-06-03 LAB — TROPONIN I: Troponin I: <0.03 ng/mL (ref <0.03)

## 2016-06-03 LAB — LIPASE, BLOOD: Lipase: 22 U/L (ref 11–51)

## 2016-06-03 LAB — HCG, QUANTITATIVE, PREGNANCY: hCG, Beta Chain, Quant, S: <1 m[IU]/mL (ref <5)

## 2016-06-03 MED ORDER — GI COCKTAIL ~~LOC~~
30.0000 mL | Freq: Once | ORAL | Status: AC
  Administered 2016-06-03: 30 mL via ORAL
  Filled 2016-06-03: qty 30

## 2016-06-03 MED ORDER — POTASSIUM CHLORIDE 10 MEQ/100ML IV SOLN
10.0000 meq | Freq: Once | INTRAVENOUS | Status: AC
  Administered 2016-06-03: 10 meq via INTRAVENOUS
  Filled 2016-06-03: qty 100

## 2016-06-03 MED ORDER — POTASSIUM CHLORIDE CRYS ER 20 MEQ PO TBCR
40.0000 meq | EXTENDED_RELEASE_TABLET | Freq: Once | ORAL | Status: AC
  Administered 2016-06-03: 40 meq via ORAL
  Filled 2016-06-03: qty 2

## 2016-06-03 MED ORDER — SODIUM CHLORIDE 0.9 % IV BOLUS (SEPSIS)
1000.0000 mL | Freq: Once | INTRAVENOUS | Status: AC
  Administered 2016-06-03: 1000 mL via INTRAVENOUS

## 2016-06-03 NOTE — ED Provider Notes (Signed)
[place] [facility] Provider Note   CSN: [id number] Arrival date & time: 06/03/16  1151     History   Chief Complaint Chief Complaint  Patient presents with  . Vomiting    HPI Natalie Joseph is a 35 y.o. female.  The history is provided by the patient.  Emesis   This is a recurrent problem. Episode onset: 3 days (from Sat through Mon) Pertinent negatives include no abdominal pain, no arthralgias, no chills, no cough, no fever and no headaches.  Chest Pain   This is a new problem. The current episode started 2 days ago. The problem occurs constantly. The problem has not changed since onset.The pain is present in the substernal region. The pain is moderate. The quality of the pain is described as vice-like. The pain does not radiate. Associated symptoms include malaise/fatigue, nausea and vomiting. Pertinent negatives include no abdominal pain, no back pain, no cough, no diaphoresis, no fever, no headaches, no lower extremity edema, no palpitations and no shortness of breath.  Pertinent negatives for past medical history include no CAD, no DVT, no hyperlipidemia, no hypertension, no MI, no PE and no seizures.  Procedure history is negative for cardiac catheterization and exercise treadmill test.    Past Medical History:  Diagnosis Date  . Cyclic vomiting syndrome   . Myalgia   . Panic attack   . Renal disorder     Patient Active Problem List   Diagnosis Date Noted  . Smoker 06/09/2013  . Screen for STD (sexually transmitted disease) 06/09/2013    Past Surgical History:  Procedure Laterality Date  . WISDOM TOOTH EXTRACTION      OB History    No data available       Home Medications    Prior to Admission medications   Medication Sig Start Date End Date Taking? Authorizing Provider  ALPRAZolam (XANAX) 0.25 MG tablet Take 0.25 mg by mouth 2 (two) times daily.     Daylene Posey, FNP  cyclobenzaprine (FLEXERIL) 10 MG tablet Take 10 mg by mouth 3 (three) times  daily as needed for muscle spasms.    Daylene Posey, FNP  docusate sodium (COLACE) 100 MG capsule Take 1 capsule (100 mg total) by mouth every 12 (twelve) hours. 01/20/15   Julianne Rice, MD  ibuprofen (ADVIL,MOTRIN) 200 MG tablet Take 800 mg by mouth every 6 (six) hours as needed. For pain     Historical Provider, MD  ondansetron (ZOFRAN) 4 MG tablet Take 1 tablet (4 mg total) by mouth every 6 (six) hours. 10/22/15   Comer Locket, PA-C  Potassium Chloride ER 20 MEQ TBCR Take 40 mEq by mouth 2 (two) times daily. 03/15/15   Evelina Bucy, MD  Pseudoephedrine-Ibuprofen 30-200 MG TABS Take 1 Dose by mouth 2 (two) times daily. 10/22/15   Comer Locket, PA-C    Family History Family History  Problem Relation Age of Onset  . Hypertension Mother   . Hyperlipidemia Mother   . Kidney disease Father   . Hypertension Father   . Depression Father   . Diabetes Father   . Heart disease Father   . Stroke Maternal Grandmother   . Cancer Maternal Grandfather     lung  . Stroke Paternal Grandmother   . Heart disease Paternal Grandfather   . Cancer Paternal Aunt     breast  . Diabetes Paternal Aunt     Social History Social History  Substance Use Topics  . Smoking status: Current Every Day Smoker  Packs/day: 2.00    Years: 17.00    Types: Cigarettes  . Smokeless tobacco: Never Used  . Alcohol use No     Allergies   Septra ds [sulfamethoxazole w/trimethoprim (co-trimoxazole)]; Sulfa antibiotics; Cheese; Corn-containing products; Eggs or egg-derived products; Peanut-containing drug products; and Shellfish allergy   Review of Systems Review of Systems  Constitutional: Positive for malaise/fatigue. Negative for chills, diaphoresis and fever.  HENT: Negative for ear pain and sore throat.   Eyes: Negative for pain and visual disturbance.  Respiratory: Negative for cough and shortness of breath.   Cardiovascular: Positive for chest pain. Negative for palpitations.  Gastrointestinal:  Positive for nausea and vomiting. Negative for abdominal pain.  Genitourinary: Negative for dysuria and hematuria.  Musculoskeletal: Negative for arthralgias and back pain.  Skin: Negative for color change and rash.  Neurological: Negative for seizures, syncope and headaches.  All other systems reviewed and are negative.    Physical Exam Updated Vital Signs BP 122/83   Pulse 95   Temp 97.9 F (36.6 C) (Oral)   Resp 21   Ht 5\' 5"  (1.651 m)   Wt 129 lb 3.2 oz (58.6 kg)   LMP 05/29/2016   SpO2 97%   BMI 21.50 kg/m   Physical Exam  Constitutional: She is oriented to person, place, and time. She appears well-developed and well-nourished. No distress.  HENT:  Head: Normocephalic and atraumatic.  Nose: Nose normal.  Eyes: Conjunctivae and EOM are normal. Pupils are equal, round, and reactive to light. Right eye exhibits no discharge. Left eye exhibits no discharge. No scleral icterus.  Neck: Normal range of motion. Neck supple.  Cardiovascular: Normal rate and regular rhythm.  Exam reveals no gallop and no friction rub.   No murmur heard. Pulmonary/Chest: Effort normal and breath sounds normal. No stridor. No respiratory distress. She has no rales.  Abdominal: Soft. She exhibits no distension. There is no tenderness.  Musculoskeletal: She exhibits no edema or tenderness.  Neurological: She is alert and oriented to person, place, and time.  Skin: Skin is warm and dry. No rash noted. She is not diaphoretic. No erythema.  Psychiatric: She has a normal mood and affect.  Vitals reviewed.    ED Treatments / Results  Labs (all labs ordered are listed, but only abnormal results are displayed) Labs Reviewed  CBC - Abnormal; Notable for the following:       Result Value   WBC 14.5 (*)    RBC 5.14 (*)    Hemoglobin 16.5 (*)    HCT 46.3 (*)    Platelets 422 (*)    All other components within normal limits  COMPREHENSIVE METABOLIC PANEL - Abnormal; Notable for the following:     Sodium 133 (*)    Potassium 2.4 (*)    Chloride 87 (*)    Total Protein 8.7 (*)    Albumin 5.2 (*)    ALT 13 (*)    Anion gap 16 (*)    All other components within normal limits  LIPASE, BLOOD  PREGNANCY, URINE  HCG, QUANTITATIVE, PREGNANCY  TROPONIN I    EKG  EKG Interpretation  Date/Time:  Wednesday June 03 2016 14:26:46 EDT Ventricular Rate:  89 PR Interval:    QRS Duration: 89 QT Interval:  381 QTC Calculation: 464 R Axis:   67 Text Interpretation:  Sinus rhythm Biatrial enlargement Confirmed by Wilson Singer  MD, STEPHEN [phone]) on 06/03/2016 3:54:51 PM       Radiology Dg Chest 2 View  Result  Date: 06/03/2016 CLINICAL DATA:  Chest pain. EXAM: CHEST  2 VIEW COMPARISON:  None. FINDINGS: The heart size and mediastinal contours are within normal limits. Both lungs are clear. The visualized skeletal structures are unremarkable. IMPRESSION: Normal exam. Electronically Signed   By: Lorriane Shire M.D.   On: 06/03/2016 13:42    Procedures Procedures (including critical care time)  Medications Ordered in ED Medications  sodium chloride 0.9 % bolus 1,000 mL (0 mLs Intravenous Stopped 06/03/16 1532)  gi cocktail (Maalox,Lidocaine,Donnatal) (30 mLs Oral Given 06/03/16 1318)  potassium chloride 10 mEq in 100 mL IVPB (10 mEq Intravenous New Bag/Given 06/03/16 1439)  potassium chloride SA (K-DUR,KLOR-CON) CR tablet 40 mEq (40 mEq Oral Given 06/03/16 1437)     Initial Impression / Assessment and Plan / ED Course  I have reviewed the triage vital signs and the nursing notes.  Pertinent labs & imaging results that were available during my care of the patient were reviewed by me and considered in my medical decision making (see chart for details).  Clinical Course    Atypical chest pain following 3 day bout of cyclical nonbloody and nonbilious emesis, which is an exacerbation of patient's known cyclical vomiting. EKG: Normal sinus rhythm, intervals, axis. No evidence of acute  ischemia, arrhythmias, or blocks. Chest x-ray without evidence suggestive of pneumonia, pneumothorax, pneumomediastinum.  No abnormal contour of the mediastinum to suggest dissection. No evidence of acute injuries. Troponin negative. Given the persistence of patient's pain for several days, feel like this is appropriate to rule out ACS. Low pretest probability for pulmonary embolism. Presentation not classic for aortic dissection. Given the fact that the patient is well-appearing and well-hydrated nontoxic without history of hematemesis or evidence of pneumomediastinum, there is low suspicion for perforated esophagus.  Labs did reveal significant hypokalemia which was replaced IV and by mouth. Patient reports prior history of this due to hyperemesis and already has repletion at home.  Patient symptomology completely resolved following IV fluids and Maalox.  The patient is appropriate for discharge with strict return precautions and close PCP follow-up. Patient already had his PCP follow-up tomorrow.  Final Clinical Impressions(s) / ED Diagnoses   Final diagnoses:  Chest pain  Non-intractable cyclical vomiting with nausea  Chest pain, unspecified chest pain type  Hypokalemia   Disposition: Discharge  Condition: Good  I have discussed the results, Dx and Tx plan with the patient who expressed understanding and agree(s) with the plan. Discharge instructions discussed at great length. The patient was given strict return precautions who verbalized understanding of the instructions. No further questions at time of discharge.    Current Discharge Medication List      Follow Up: Daylene Posey, [id number] [address] [phone]  In 1 day as scheduled      Fatima Blank, MD 06/03/16 [phone]

## 2016-06-03 NOTE — ED Triage Notes (Addendum)
C/o "cyclic vomiting" x 5 days-feeling of "heart pressure" x 3 days-NAD-steady gait-pt presented to triage advising staff of what tests she needed-reassured EDP would assess and plan of treatment as needed-pt anxious/crying-talking at fast pace-mother is with pt

## 2016-06-03 NOTE — ED Notes (Signed)
Pt OK'd to drink per MD; pt drinking water she brought from home; ice provided per pt request.

## 2016-06-14 ENCOUNTER — Emergency Department (HOSPITAL_BASED_OUTPATIENT_CLINIC_OR_DEPARTMENT_OTHER)
Admission: EM | Admit: 2016-06-14 | Discharge: 2016-06-14 | Discharge status: Against Medical Advice | Payer: Medicaid Other | Attending: Emergency Medicine | Admitting: Emergency Medicine

## 2016-06-14 ENCOUNTER — Encounter (HOSPITAL_BASED_OUTPATIENT_CLINIC_OR_DEPARTMENT_OTHER): Payer: Self-pay | Admitting: Emergency Medicine

## 2016-06-14 ENCOUNTER — Emergency Department (HOSPITAL_BASED_OUTPATIENT_CLINIC_OR_DEPARTMENT_OTHER): Payer: Medicaid Other

## 2016-06-14 DIAGNOSIS — E876 Hypokalemia: Secondary | ICD-10-CM | POA: Insufficient documentation

## 2016-06-14 DIAGNOSIS — R112 Nausea with vomiting, unspecified: Secondary | ICD-10-CM | POA: Diagnosis present

## 2016-06-14 DIAGNOSIS — R1115 Cyclical vomiting syndrome unrelated to migraine: Secondary | ICD-10-CM

## 2016-06-14 DIAGNOSIS — Z79899 Other long term (current) drug therapy: Secondary | ICD-10-CM | POA: Insufficient documentation

## 2016-06-14 DIAGNOSIS — F17210 Nicotine dependence, cigarettes, uncomplicated: Secondary | ICD-10-CM | POA: Insufficient documentation

## 2016-06-14 DIAGNOSIS — G43A0 Cyclical vomiting, not intractable: Secondary | ICD-10-CM | POA: Insufficient documentation

## 2016-06-14 LAB — CBC WITH DIFFERENTIAL/PLATELET
Basophils Absolute: 0 10*3/uL (ref 0.0–0.1)
Basophils Relative: 0 %
Eosinophils Absolute: 0 10*3/uL (ref 0.0–0.7)
Eosinophils Relative: 0 %
HCT: 41.4 % (ref 36.0–46.0)
Hemoglobin: 14.8 g/dL (ref 12.0–15.0)
Lymphocytes Relative: 12 %
Lymphs Abs: 2.7 10*3/uL (ref 0.7–4.0)
MCH: 32.2 pg (ref 26.0–34.0)
MCHC: 35.7 g/dL (ref 30.0–36.0)
MCV: 90 fL (ref 78.0–100.0)
Monocytes Absolute: 2.5 10*3/uL — ABNORMAL HIGH (ref 0.1–1.0)
Monocytes Relative: 11 %
Neutro Abs: 17.8 10*3/uL — ABNORMAL HIGH (ref 1.7–7.7)
Neutrophils Relative %: 77 %
Platelets: 415 10*3/uL — ABNORMAL HIGH (ref 150–400)
RBC: 4.6 MIL/uL (ref 3.87–5.11)
RDW: 13.1 % (ref 11.5–15.5)
WBC: 23 10*3/uL — ABNORMAL HIGH (ref 4.0–10.5)

## 2016-06-14 LAB — COMPREHENSIVE METABOLIC PANEL
ALT: 16 U/L (ref 14–54)
AST: 22 U/L (ref 15–41)
Albumin: 5.4 g/dL — ABNORMAL HIGH (ref 3.5–5.0)
Alkaline Phosphatase: 67 U/L (ref 38–126)
Anion gap: 17 — ABNORMAL HIGH (ref 5–15)
BUN: 19 mg/dL (ref 6–20)
CO2: 35 mmol/L — ABNORMAL HIGH (ref 22–32)
Calcium: 9.8 mg/dL (ref 8.9–10.3)
Chloride: 80 mmol/L — ABNORMAL LOW (ref 101–111)
Creatinine, Ser: 0.79 mg/dL (ref 0.44–1.00)
GFR calc Af Amer: >60 mL/min (ref >60)
GFR calc non Af Amer: >60 mL/min (ref >60)
Glucose, Bld: 83 mg/dL (ref 65–99)
Potassium: 2.2 mmol/L — CL (ref 3.5–5.1)
Sodium: 132 mmol/L — ABNORMAL LOW (ref 135–145)
Total Bilirubin: 1.3 mg/dL — ABNORMAL HIGH (ref 0.3–1.2)
Total Protein: 8.7 g/dL — ABNORMAL HIGH (ref 6.5–8.1)

## 2016-06-14 LAB — URINALYSIS, ROUTINE W REFLEX MICROSCOPIC
Bilirubin Urine: NEGATIVE
Glucose, UA: NEGATIVE mg/dL
Hgb urine dipstick: NEGATIVE
Ketones, ur: >80 mg/dL — AB
Leukocytes, UA: NEGATIVE
Nitrite: NEGATIVE
Protein, ur: 100 mg/dL — AB
Specific Gravity, Urine: 1.024 (ref 1.005–1.030)
pH: 7.5 (ref 5.0–8.0)

## 2016-06-14 LAB — BASIC METABOLIC PANEL
Anion gap: 10 (ref 5–15)
BUN: 13 mg/dL (ref 6–20)
CO2: 27 mmol/L (ref 22–32)
Calcium: 8 mg/dL — ABNORMAL LOW (ref 8.9–10.3)
Chloride: 96 mmol/L — ABNORMAL LOW (ref 101–111)
Creatinine, Ser: 0.7 mg/dL (ref 0.44–1.00)
GFR calc Af Amer: >60 mL/min (ref >60)
GFR calc non Af Amer: >60 mL/min (ref >60)
Glucose, Bld: 80 mg/dL (ref 65–99)
Potassium: 3 mmol/L — ABNORMAL LOW (ref 3.5–5.1)
Sodium: 133 mmol/L — ABNORMAL LOW (ref 135–145)

## 2016-06-14 LAB — LIPASE, BLOOD: Lipase: 15 U/L (ref 11–51)

## 2016-06-14 LAB — RAPID URINE DRUG SCREEN, HOSP PERFORMED
Amphetamines: NOT DETECTED
Barbiturates: NOT DETECTED
Benzodiazepines: NOT DETECTED
Cocaine: NOT DETECTED
Opiates: NOT DETECTED
Tetrahydrocannabinol: POSITIVE — AB

## 2016-06-14 LAB — URINE MICROSCOPIC-ADD ON

## 2016-06-14 LAB — PREGNANCY, URINE: Preg Test, Ur: NEGATIVE

## 2016-06-14 LAB — MAGNESIUM: Magnesium: 1.7 mg/dL (ref 1.7–2.4)

## 2016-06-14 MED ORDER — POTASSIUM CHLORIDE 10 MEQ/100ML IV SOLN
10.0000 meq | INTRAVENOUS | Status: AC
  Administered 2016-06-14 (×3): 10 meq via INTRAVENOUS
  Filled 2016-06-14 (×3): qty 100

## 2016-06-14 MED ORDER — SODIUM CHLORIDE 0.9 % IV BOLUS (SEPSIS)
1000.0000 mL | Freq: Once | INTRAVENOUS | Status: AC
  Administered 2016-06-14: 1000 mL via INTRAVENOUS

## 2016-06-14 MED ORDER — METOCLOPRAMIDE HCL 5 MG/ML IJ SOLN
10.0000 mg | Freq: Once | INTRAMUSCULAR | Status: AC
  Administered 2016-06-14: 10 mg via INTRAVENOUS
  Filled 2016-06-14: qty 2

## 2016-06-14 MED ORDER — POTASSIUM CHLORIDE CRYS ER 20 MEQ PO TBCR
60.0000 meq | EXTENDED_RELEASE_TABLET | Freq: Once | ORAL | Status: AC
  Administered 2016-06-14: 60 meq via ORAL
  Filled 2016-06-14: qty 3

## 2016-06-14 MED ORDER — ONDANSETRON HCL 4 MG/2ML IJ SOLN
4.0000 mg | Freq: Once | INTRAMUSCULAR | Status: AC
  Administered 2016-06-14: 4 mg via INTRAVENOUS
  Filled 2016-06-14: qty 2

## 2016-06-14 MED ORDER — GI COCKTAIL ~~LOC~~
30.0000 mL | Freq: Once | ORAL | Status: AC
  Administered 2016-06-14: 30 mL via ORAL
  Filled 2016-06-14: qty 30

## 2016-06-14 NOTE — ED Triage Notes (Signed)
"   I suffer cyclic vomiting and  I have been having n/v and I have numbness to my arms today enroute to ED.

## 2016-06-14 NOTE — ED Notes (Signed)
Patient reports she is ready to go even though she needs to stay and receive IV potassium.  PA made aware.  Patient states she will stay for BMP lab draw

## 2016-06-14 NOTE — ED Notes (Signed)
EDPA in to see pt

## 2016-06-14 NOTE — ED Provider Notes (Signed)
[facility] DEPT [facility] Provider Note   CSN: [id number] Arrival date & time: 06/14/16  1624  By signing my name below, I, Natalie Joseph, attest that this documentation has been prepared under the direction and in the presence of non-physician practitioner, Anne Ng, PA-C. Electronically Signed: Higinio Joseph, Scribe. 06/14/2016. 5:49 PM.  History   Chief Complaint Chief Complaint  Patient presents with  . Nausea   The history is provided by the patient. No language interpreter was used.   HPI Comments: Natalie Joseph is a 35 y.o. female who presents to the Emergency Department complaining of persistent, nausea that began 4 days ago and vomiting for the past 24 hours. Pt reports she "suffers from cyclic vomiting syndrome" and believes she is having an exacerbation of this. She states she has vomited every 10-20 minutes since yesterday. She notes associated neck and back pain and states she has lost 10 pounds within the past week. Pt reports she recently saw a gastroenterologist who told her that "everything was in her head and nothing was wrong with her stomach" and she disagrees with this. She denies dysuria and urinary frequency. She states she smokes 2 pack of cigarettes a day but denies drinking EtOH. Denies marijuana or other illicit drug use.  Past Medical History:  Diagnosis Date  . Cyclic vomiting syndrome   . Myalgia   . Panic attack   . Renal disorder    Patient Active Problem List   Diagnosis Date Noted  . Smoker 06/09/2013  . Screen for STD (sexually transmitted disease) 06/09/2013    Past Surgical History:  Procedure Laterality Date  . WISDOM TOOTH EXTRACTION      OB History    No data available     Home Medications    Prior to Admission medications   Medication Sig Start Date End Date Taking? Authorizing Provider  ALPRAZolam (XANAX) 0.25 MG tablet Take 0.25 mg by mouth 2 (two) times daily.    Yes Daylene Posey, FNP  cyclobenzaprine (FLEXERIL) 10 MG tablet  Take 10 mg by mouth 3 (three) times daily as needed for muscle spasms.   Yes Daylene Posey, FNP  docusate sodium (COLACE) 100 MG capsule Take 1 capsule (100 mg total) by mouth every 12 (twelve) hours. 01/20/15  Yes Julianne Rice, MD  ibuprofen (ADVIL,MOTRIN) 200 MG tablet Take 800 mg by mouth every 6 (six) hours as needed. For pain    Yes Historical Provider, MD  ondansetron (ZOFRAN) 4 MG tablet Take 1 tablet (4 mg total) by mouth every 6 (six) hours. 10/22/15  Yes Comer Locket, PA-C  Potassium Chloride ER 20 MEQ TBCR Take 40 mEq by mouth 2 (two) times daily. 03/15/15  Yes Evelina Bucy, MD  Pseudoephedrine-Ibuprofen 30-200 MG TABS Take 1 Dose by mouth 2 (two) times daily. 10/22/15  Yes Comer Locket, PA-C    Family History Family History  Problem Relation Age of Onset  . Hypertension Mother   . Hyperlipidemia Mother   . Kidney disease Father   . Hypertension Father   . Depression Father   . Diabetes Father   . Heart disease Father   . Stroke Maternal Grandmother   . Cancer Maternal Grandfather     lung  . Stroke Paternal Grandmother   . Heart disease Paternal Grandfather   . Cancer Paternal Aunt     breast  . Diabetes Paternal Aunt     Social History Social History  Substance Use Topics  . Smoking status: Current Every Day Smoker  Packs/day: 2.00    Years: 17.00    Types: Cigarettes  . Smokeless tobacco: Never Used  . Alcohol use No    Allergies   Septra ds [sulfamethoxazole w/trimethoprim (co-trimoxazole)]; Sulfa antibiotics; Cheese; Corn-containing products; Eggs or egg-derived products; Peanut-containing drug products; and Shellfish allergy  Review of Systems Review of Systems 10 systems reviewed and all are negative for acute change except as noted in the HPI.  Physical Exam Updated Vital Signs Ht 5\' 4"  (1.626 m)   Wt 129 lb 9 oz (58.8 kg)   LMP 05/29/2016   BMI 22.24 kg/m   Physical Exam  Constitutional: She is oriented to person, place, and time.    HENT:  Right Ear: External ear normal.  Left Ear: External ear normal.  Nose: Nose normal.  Mouth/Throat: Oropharynx is clear and moist. No oropharyngeal exudate.  MM dry  Eyes: Conjunctivae are normal.  Neck: Neck supple.  Cardiovascular: Normal rate, regular rhythm, normal heart sounds and intact distal pulses.   Pulmonary/Chest: Effort normal and breath sounds normal. No respiratory distress. She has no wheezes.  Abdominal: Soft. Bowel sounds are normal. She exhibits no distension. There is no tenderness. There is no rebound and no guarding.  Musculoskeletal: She exhibits no edema.  Lymphadenopathy:    She has no cervical adenopathy.  Neurological: She is alert and oriented to person, place, and time. No cranial nerve deficit.  Skin: Skin is warm and dry.  Psychiatric: She has a normal mood and affect.  Nursing note and vitals reviewed.    ED Treatments / Results  Labs (all labs ordered are listed, but only abnormal results are displayed) Labs Reviewed  URINALYSIS, ROUTINE W REFLEX MICROSCOPIC (NOT AT [facility]) - Abnormal; Notable for the following:       Result Value   APPearance CLOUDY (*)    Ketones, ur >80 (*)    Protein, ur 100 (*)    All other components within normal limits  COMPREHENSIVE METABOLIC PANEL - Abnormal; Notable for the following:    Sodium 132 (*)    Potassium 2.2 (*)    Chloride 80 (*)    CO2 35 (*)    Total Protein 8.7 (*)    Albumin 5.4 (*)    Total Bilirubin 1.3 (*)    Anion gap 17 (*)    All other components within normal limits  CBC WITH DIFFERENTIAL/PLATELET - Abnormal; Notable for the following:    WBC 23.0 (*)    Platelets 415 (*)    Neutro Abs 17.8 (*)    Monocytes Absolute 2.5 (*)    All other components within normal limits  URINE RAPID DRUG SCREEN, HOSP PERFORMED - Abnormal; Notable for the following:    Tetrahydrocannabinol POSITIVE (*)    All other components within normal limits  URINE MICROSCOPIC-ADD ON - Abnormal; Notable for  the following:    Squamous Epithelial / LPF 6-30 (*)    Bacteria, UA MANY (*)    Casts HYALINE CASTS (*)    All other components within normal limits  BASIC METABOLIC PANEL - Abnormal; Notable for the following:    Sodium 133 (*)    Potassium 3.0 (*)    Chloride 96 (*)    Calcium 8.0 (*)    All other components within normal limits  PREGNANCY, URINE  LIPASE, BLOOD  MAGNESIUM    EKG  EKG Interpretation  Date/Time:  Sunday June 14 2016 19:14:03 EDT Ventricular Rate:  78 PR Interval:    QRS Duration: 96  QT Interval:  410 QTC Calculation: 467 R Axis:   69 Text Interpretation:  Sinus rhythm Atrial premature complex Probable left atrial enlargement Minimal ST depression, inferior leads No significant change since last tracing Confirmed by [facility]  MD, Loree Fee ([id number]) on 06/14/2016 10:44:22 PM       Radiology Dg Abdomen Acute W/chest  Result Date: 06/14/2016 CLINICAL DATA:  Persistent nausea vomiting. EXAM: DG ABDOMEN ACUTE W/ 1V CHEST COMPARISON:  Chest radiograph 06/03/2016 FINDINGS: There is no evidence of dilated bowel loops or free intraperitoneal air. No evidence of organomegaly. Punctate calcific foci as seen overlying bilateral renal shadows, raising the possibility of renal calculated. Heart size and mediastinal contours are within normal limits. Both lungs are clear. IMPRESSION: Negati nonobstructive bowel gas pattern. Probable bilateral renal calculi. No acute cardiopulmonary disease. Electronically Signed   By: Fidela Salisbury M.D.   On: 06/14/2016 19:28    Procedures Procedures (including critical care time)  Medications Ordered in ED Medications  potassium chloride 10 mEq in 100 mL IVPB (0 mEq Intravenous Stopped 06/14/16 2310)  sodium chloride 0.9 % bolus 1,000 mL (0 mLs Intravenous Stopped 06/14/16 1913)  ondansetron (ZOFRAN) injection 4 mg (4 mg Intravenous Given 06/14/16 1810)  metoCLOPramide (REGLAN) injection 10 mg (10 mg Intravenous Given 06/14/16 1810)   gi cocktail (Maalox,Lidocaine,Donnatal) (30 mLs Oral Given 06/14/16 1811)  sodium chloride 0.9 % bolus 1,000 mL (1,000 mLs Intravenous New Bag/Given 06/14/16 1916)  potassium chloride SA (K-DUR,KLOR-CON) CR tablet 60 mEq (60 mEq Oral Given 06/14/16 1928)    DIAGNOSTIC STUDIES:  Oxygen Saturation is 99% on RA, normal by my interpretation.    COORDINATION OF CARE:  5:59 PM Discussed treatment Joseph with pt at bedside and pt agreed to Joseph.  Initial Impression / Assessment and Joseph / ED Course  I have reviewed the triage vital signs and the nursing notes.  Pertinent labs & imaging results that were available during my care of the patient were reviewed by me and considered in my medical decision making (see chart for details).  Clinical Course    Pt is an 35 y.o. female with history of cyclical vomiting syndrome, here with n/v which is likely secondary to same. UDS positive for marijuana. Pt reports she has never used marijuana. Her labs were significant for a potassium of 2.2, gap of 17. Bicarb 35, chloride 80. Initially discussed admission for hypokalemia which she refused. Joseph was for intravenous and oral repletion. Prior to completion of intravenous potassium pt refusing further treatment. Refusing to wait for results of BMP. She wanted to leave AMA. She verbalized her understanding of risks of leaving prior to at least re-check of potassium including disability or death. She is also refusing prescription of any potassium repletion at home. She states she will follow up with PCP. After pt left her repeat BMP with K+ of 3.0, resolution of gap.   I personally performed the services described in this documentation, which was scribed in my presence. The recorded information has been reviewed and is accurate.   Final Clinical Impressions(s) / ED Diagnoses   Final diagnoses:  Non-intractable cyclical vomiting with nausea  Hypokalemia    New Prescriptions New Prescriptions   No medications  on file     Carmelina Dane 06/14/16 [place], MD 06/14/16 2334

## 2016-06-14 NOTE — ED Notes (Signed)
Patient notified urine sample needed.  States unable to provide at this time.

## 2016-06-14 NOTE — ED Notes (Signed)
PA at bedside. Pt unable to void at this time.

## 2016-06-14 NOTE — Discharge Instructions (Signed)
You were seen in the emergency room today for evaluation of nausea and vomiting, likely secondary to your cyclical vomiting syndrome. Your potassium was very low (2.2). We started oral and intravenous repletion in the emergency room. You did not want to stay for the entire dose of potassium and did not want to wait for the repeat blood test. You also declined a prescription for potassium supplements. Please follow up with your primary care provider as soon as possible for further evaluation and re-check of your blood work. I also encourage you to decrease and stop using marijuana.

## 2016-07-16 DIAGNOSIS — K648 Other hemorrhoids: Secondary | ICD-10-CM | POA: Insufficient documentation

## 2016-09-16 ENCOUNTER — Ambulatory Visit (INDEPENDENT_AMBULATORY_CARE_PROVIDER_SITE_OTHER): Payer: Medicaid Other | Admitting: Certified Nurse Midwife

## 2016-09-16 ENCOUNTER — Other Ambulatory Visit (HOSPITAL_COMMUNITY)
Admission: RE | Admit: 2016-09-16 | Discharge: 2016-09-16 | Disposition: A | Discharge status: Home / Self Care | Payer: Medicaid Other | Source: Ambulatory Visit | Attending: Certified Nurse Midwife | Admitting: Certified Nurse Midwife

## 2016-09-16 ENCOUNTER — Encounter: Payer: Self-pay | Admitting: Certified Nurse Midwife

## 2016-09-16 VITALS — BP 132/80 | HR 115 | Wt 128.7 lb

## 2016-09-16 DIAGNOSIS — R1115 Cyclical vomiting syndrome unrelated to migraine: Secondary | ICD-10-CM

## 2016-09-16 DIAGNOSIS — Z01419 Encounter for gynecological examination (general) (routine) without abnormal findings: Secondary | ICD-10-CM | POA: Diagnosis not present

## 2016-09-16 DIAGNOSIS — Z716 Tobacco abuse counseling: Secondary | ICD-10-CM

## 2016-09-16 DIAGNOSIS — Z1151 Encounter for screening for human papillomavirus (HPV): Secondary | ICD-10-CM | POA: Diagnosis not present

## 2016-09-16 DIAGNOSIS — Z0000 Encounter for general adult medical examination without abnormal findings: Secondary | ICD-10-CM | POA: Diagnosis not present

## 2016-09-16 DIAGNOSIS — Z113 Encounter for screening for infections with a predominantly sexual mode of transmission: Secondary | ICD-10-CM | POA: Diagnosis present

## 2016-09-16 DIAGNOSIS — Z3169 Encounter for other general counseling and advice on procreation: Secondary | ICD-10-CM

## 2016-09-16 NOTE — Progress Notes (Signed)
Subjective:        Natalie Joseph is a 36 y.o. female here for a routine exam.  Has been sexually active with her ex, desires full STD screening.  She also is desiring to quit smoking, smokes about 2 packs a day for at least 20 years.    Desires pregnancy.   LMP was in December, but not a normal period.  Has not had a period this month.  Has not taken a home pregnancy test.  Is sexually active with her ex-husband.  Desires full STD screening.  Has cyclical vomiting and is seeing a specialist at [facility].  Had colonoscopy 09/15/16, has endoscopy scheduled for 09/17/16.    Personal health questionnaire:  Is patient Ashkenazi Jewish, have a family history of breast and/or ovarian cancer: no Is there a family history of uterine cancer diagnosed at age < 56, gastrointestinal cancer, urinary tract cancer, family member who is a Field seismologist syndrome-associated carrier: no Is the patient overweight and hypertensive, family history of diabetes, personal history of gestational diabetes, preeclampsia or PCOS: no Is patient over 12, have PCOS,  family history of premature CHD under age 34, diabetes, smoke, have hypertension or peripheral artery disease:  yes At any time, has a partner hit, kicked or otherwise hurt or frightened you?: no Over the past 2 weeks, have you felt down, depressed or hopeless?: no Over the past 2 weeks, have you felt little interest or pleasure in doing things?:sometimes  Gynecologic History No LMP recorded. Contraception: OCP (estrogen/progesterone) Last Pap: 03/06/15. Results were: ASCUS HPV neg.  Last mammogram: n/a.   Obstetric History OB History  No data available    Past Medical History:  Diagnosis Date  . Cyclic vomiting syndrome   . Myalgia   . Panic attack   . Renal disorder     Past Surgical History:  Procedure Laterality Date  . WISDOM TOOTH EXTRACTION       Current Outpatient Prescriptions:  .  ALPRAZolam (XANAX) 0.25 MG tablet, Take 0.25 mg by mouth 2 (two)  times daily. , Disp: , Rfl:  .  cyclobenzaprine (FLEXERIL) 10 MG tablet, Take 10 mg by mouth 3 (three) times daily as needed for muscle spasms., Disp: , Rfl:  .  docusate sodium (COLACE) 100 MG capsule, Take 1 capsule (100 mg total) by mouth every 12 (twelve) hours., Disp: 60 capsule, Rfl: 0 .  ibuprofen (ADVIL,MOTRIN) 200 MG tablet, Take 800 mg by mouth every 6 (six) hours as needed. For pain , Disp: , Rfl:  .  ondansetron (ZOFRAN) 4 MG tablet, Take 1 tablet (4 mg total) by mouth every 6 (six) hours., Disp: 12 tablet, Rfl: 0 .  Potassium Chloride ER 20 MEQ TBCR, Take 40 mEq by mouth 2 (two) times daily., Disp: 16 tablet, Rfl: 0 .  Pseudoephedrine-Ibuprofen 30-200 MG TABS, Take 1 Dose by mouth 2 (two) times daily., Disp: 20 each, Rfl: 0 Allergies  Allergen Reactions  . Septra Ds [Sulfamethoxazole W/Trimethoprim (Co-Trimoxazole)] Nausea And Vomiting  . Sulfa Antibiotics Nausea And Vomiting  . Cheese   . Corn-Containing Products   . Eggs Or Egg-Derived Products   . Peanut-Containing Drug Products   . Shellfish Allergy     Social History  Substance Use Topics  . Smoking status: Current Every Day Smoker    Packs/day: 2.00    Years: 17.00    Types: Cigarettes  . Smokeless tobacco: Never Used  . Alcohol use No    Family History  Problem Relation Age  of Onset  . Hypertension Mother   . Hyperlipidemia Mother   . Kidney disease Father   . Hypertension Father   . Depression Father   . Diabetes Father   . Heart disease Father   . Stroke Maternal Grandmother   . Cancer Maternal Grandfather     lung  . Stroke Paternal Grandmother   . Heart disease Paternal Grandfather   . Cancer Paternal Aunt     breast  . Diabetes Paternal Aunt       Review of Systems  Constitutional: + for fatigue and weight loss Respiratory: negative for cough and wheezing Cardiovascular: negative for chest pain, fatigue and palpitations Gastrointestinal: + for abdominal pain and change in bowel habits,  cyclical vomiting Musculoskeletal:negative for myalgias Neurological: negative for gait problems and tremors Behavioral/Psych: negative for abusive relationship, depression Endocrine: negative for temperature intolerance    Genitourinary:negative for abnormal menstrual periods, genital lesions, hot flashes, sexual problems and vaginal discharge Integument/breast: negative for breast lump, breast tenderness, nipple discharge and skin lesion(s)    Objective:       BP 132/80   Pulse (!) 115   Wt 128 lb 11.2 oz (58.4 kg)   BMI 22.09 kg/m  General:   alert  Skin:   no rash or abnormalities  Lungs:   clear to auscultation bilaterally  Heart:   regular rate and rhythm, S1, S2 normal, no murmur, click, rub or gallop  Breasts:   normal without suspicious masses, skin or nipple changes or axillary nodes  Abdomen:  normal findings: no organomegaly, soft, non-tender and no hernia  Pelvis:  External genitalia: normal general appearance Urinary system: urethral meatus normal and bladder without fullness, nontender Vaginal: normal without tenderness, induration or masses Cervix: normal appearance Adnexa: normal bimanual exam Uterus: anteverted and non-tender, normal size   Lab Review Urine pregnancy test Labs reviewed yes Radiologic studies reviewed no  50% of 45 min visit spent on counseling and coordination of care.    Assessment:    Healthy female exam.   Contraception management  STD screening exam  Preconception counseling  Tobacco abuse counseling  Plan:    Education reviewed: calcium supplements, depression evaluation, low fat, low cholesterol diet, safe sex/STD prevention, self breast exams, skin cancer screening and weight bearing exercise. Contraception: OCP (estrogen/progesterone). Follow up in: 1 year.   No orders of the defined types were placed in this encounter.  Orders Placed This Encounter  Procedures  . RPR  . Hepatitis C antibody  . Hepatitis B surface  antigen  . HIV antibody  . CBC with Differential/Platelet  . Comprehensive metabolic panel  . TSH  . Hepatitis A antibody, IgM  . Progesterone  . Estrogens, Total  . Hepatic function panel  . Beta HCG, Quant  . HSV 1 AND 2 IGM ABS, INDIRECT  . Ambulatory referral to Endocrinology    Referral Priority:   Routine    Referral Type:   Consultation    Referral Reason:   Specialty Services Required    Number of Visits Requested:   1

## 2016-09-17 LAB — BETA HCG QUANT (REF LAB): hCG Quant: <1 m[IU]/mL

## 2016-09-18 LAB — CERVICOVAGINAL ANCILLARY ONLY
Bacterial vaginitis: NEGATIVE
Candida vaginitis: NEGATIVE
Chlamydia: NEGATIVE
Neisseria Gonorrhea: NEGATIVE
Trichomonas: NEGATIVE

## 2016-09-19 LAB — HSV 1 AND 2 IGM ABS, INDIRECT
HSV 1 IgM: 1:10 {titer} — ABNORMAL HIGH
HSV 2 IgM: <1:10 {titer}

## 2016-09-21 LAB — CYTOLOGY - PAP
Diagnosis: NEGATIVE
HPV: NOT DETECTED

## 2016-09-21 LAB — CBC WITH DIFFERENTIAL/PLATELET
Basophils Absolute: 0 10*3/uL (ref 0.0–0.2)
Basos: 0 %
EOS (ABSOLUTE): 0 10*3/uL (ref 0.0–0.4)
Eos: 0 %
Hematocrit: 40.7 % (ref 34.0–46.6)
Hemoglobin: 14.1 g/dL (ref 11.1–15.9)
Immature Grans (Abs): 0 10*3/uL (ref 0.0–0.1)
Immature Granulocytes: 0 %
Lymphocytes Absolute: 1.6 10*3/uL (ref 0.7–3.1)
Lymphs: 10 %
MCH: 33.2 pg — ABNORMAL HIGH (ref 26.6–33.0)
MCHC: 34.6 g/dL (ref 31.5–35.7)
MCV: 96 fL (ref 79–97)
Monocytes Absolute: 0.5 10*3/uL (ref 0.1–0.9)
Monocytes: 3 %
Neutrophils Absolute: 14.1 10*3/uL — ABNORMAL HIGH (ref 1.4–7.0)
Neutrophils: 87 %
Platelets: 410 10*3/uL — ABNORMAL HIGH (ref 150–379)
RBC: 4.25 x10E6/uL (ref 3.77–5.28)
RDW: 14 % (ref 12.3–15.4)
WBC: 16.4 10*3/uL — ABNORMAL HIGH (ref 3.4–10.8)

## 2016-09-21 LAB — COMPREHENSIVE METABOLIC PANEL
ALT: 16 IU/L (ref 0–32)
AST: 18 IU/L (ref 0–40)
Albumin/Globulin Ratio: 1.7 (ref 1.2–2.2)
Albumin: 4.5 g/dL (ref 3.5–5.5)
Alkaline Phosphatase: 103 IU/L (ref 39–117)
BUN/Creatinine Ratio: 13 (ref 9–23)
BUN: 7 mg/dL (ref 6–20)
Bilirubin Total: <0.2 mg/dL (ref 0.0–1.2)
CO2: 27 mmol/L (ref 18–29)
Calcium: 10 mg/dL (ref 8.7–10.2)
Chloride: 93 mmol/L — ABNORMAL LOW (ref 96–106)
Creatinine, Ser: 0.56 mg/dL — ABNORMAL LOW (ref 0.57–1.00)
GFR calc Af Amer: 140 mL/min/{1.73_m2} (ref >59)
GFR calc non Af Amer: 121 mL/min/{1.73_m2} (ref >59)
Globulin, Total: 2.6 g/dL (ref 1.5–4.5)
Glucose: 111 mg/dL — ABNORMAL HIGH (ref 65–99)
Potassium: 3.4 mmol/L — ABNORMAL LOW (ref 3.5–5.2)
Sodium: 140 mmol/L (ref 134–144)
Total Protein: 7.1 g/dL (ref 6.0–8.5)

## 2016-09-21 LAB — HIV ANTIBODY (ROUTINE TESTING W REFLEX): HIV Screen 4th Generation wRfx: NONREACTIVE

## 2016-09-21 LAB — RPR: RPR Ser Ql: NONREACTIVE

## 2016-09-21 LAB — HEPATITIS B SURFACE ANTIGEN: Hepatitis B Surface Ag: NEGATIVE

## 2016-09-21 LAB — HEPATIC FUNCTION PANEL: Bilirubin, Direct: 0.1 mg/dL (ref 0.00–0.40)

## 2016-09-21 LAB — ESTROGENS, TOTAL: Estrogen: 132 pg/mL

## 2016-09-21 LAB — PROGESTERONE: Progesterone: <0.1 ng/mL

## 2016-09-21 LAB — HEPATITIS A ANTIBODY, IGM: Hep A IgM: NEGATIVE

## 2016-09-21 LAB — TSH: TSH: 0.263 u[IU]/mL — ABNORMAL LOW (ref 0.450–4.500)

## 2016-09-21 LAB — HEPATITIS C ANTIBODY: Hep C Virus Ab: <0.1 s/co ratio (ref 0.0–0.9)

## 2016-09-25 ENCOUNTER — Other Ambulatory Visit: Payer: Medicaid Other

## 2016-09-28 ENCOUNTER — Other Ambulatory Visit: Payer: Medicaid Other

## 2016-09-29 ENCOUNTER — Other Ambulatory Visit: Payer: Medicaid Other

## 2016-09-29 DIAGNOSIS — R7989 Other specified abnormal findings of blood chemistry: Secondary | ICD-10-CM

## 2016-09-30 LAB — THYROID PANEL
Free Thyroxine Index: 1.7 (ref 1.2–4.9)
T3 Uptake Ratio: 30 % (ref 24–39)
T4, Total: 5.5 ug/dL (ref 4.5–12.0)

## 2016-11-11 DIAGNOSIS — K6289 Other specified diseases of anus and rectum: Secondary | ICD-10-CM | POA: Insufficient documentation

## 2017-01-07 DIAGNOSIS — Z20818 Contact with and (suspected) exposure to other bacterial communicable diseases: Secondary | ICD-10-CM | POA: Insufficient documentation

## 2017-04-28 ENCOUNTER — Encounter (HOSPITAL_BASED_OUTPATIENT_CLINIC_OR_DEPARTMENT_OTHER): Payer: Self-pay

## 2017-04-28 DIAGNOSIS — Z79899 Other long term (current) drug therapy: Secondary | ICD-10-CM | POA: Diagnosis not present

## 2017-04-28 DIAGNOSIS — E876 Hypokalemia: Secondary | ICD-10-CM | POA: Insufficient documentation

## 2017-04-28 DIAGNOSIS — F17210 Nicotine dependence, cigarettes, uncomplicated: Secondary | ICD-10-CM | POA: Insufficient documentation

## 2017-04-28 DIAGNOSIS — Z91010 Allergy to peanuts: Secondary | ICD-10-CM | POA: Diagnosis not present

## 2017-04-28 DIAGNOSIS — E871 Hypo-osmolality and hyponatremia: Secondary | ICD-10-CM | POA: Insufficient documentation

## 2017-04-28 NOTE — ED Triage Notes (Addendum)
Pt was called by PCP at 1400 today and told to come to the ED because her sodium is 128, pt denies any symptoms.  She has one of her "cyclic vomiting attacks" late last week

## 2017-04-29 ENCOUNTER — Emergency Department (HOSPITAL_BASED_OUTPATIENT_CLINIC_OR_DEPARTMENT_OTHER)
Admission: EM | Admit: 2017-04-29 | Discharge: 2017-04-29 | Disposition: A | Discharge status: Home / Self Care | Payer: Medicaid Other | Attending: Emergency Medicine | Admitting: Emergency Medicine

## 2017-04-29 DIAGNOSIS — E876 Hypokalemia: Secondary | ICD-10-CM

## 2017-04-29 DIAGNOSIS — E871 Hypo-osmolality and hyponatremia: Secondary | ICD-10-CM

## 2017-04-29 LAB — BASIC METABOLIC PANEL
Anion gap: 11 (ref 5–15)
BUN: 26 mg/dL — ABNORMAL HIGH (ref 6–20)
CO2: 32 mmol/L (ref 22–32)
Calcium: 9.1 mg/dL (ref 8.9–10.3)
Chloride: 89 mmol/L — ABNORMAL LOW (ref 101–111)
Creatinine, Ser: 0.93 mg/dL (ref 0.44–1.00)
GFR calc Af Amer: >60 mL/min (ref >60)
GFR calc non Af Amer: >60 mL/min (ref >60)
Glucose, Bld: 112 mg/dL — ABNORMAL HIGH (ref 65–99)
Potassium: 3.1 mmol/L — ABNORMAL LOW (ref 3.5–5.1)
Sodium: 132 mmol/L — ABNORMAL LOW (ref 135–145)

## 2017-04-29 MED ORDER — POTASSIUM CHLORIDE 20 MEQ PO PACK
40.0000 meq | PACK | Freq: Once | ORAL | Status: DC
  Filled 2017-04-29: qty 2

## 2017-04-29 MED ORDER — POTASSIUM CHLORIDE 20 MEQ/15ML (10%) PO SOLN
ORAL | Status: AC
  Administered 2017-04-29: 40 meq
  Filled 2017-04-29: qty 30

## 2017-04-29 NOTE — ED Notes (Signed)
ED Provider at bedside. 

## 2017-04-29 NOTE — ED Provider Notes (Signed)
[facility] DEPT [facility] Provider Note: [name], MD, FACEP  CSN: [id number] MRN: [id number] ARRIVAL: 04/28/17 at 2258 ROOM: [facility]  Abnormal Lab   HISTORY OF PRESENT ILLNESS  04/29/17 2:31 AM Natalie Joseph is a 36 y.o. female with a history of cyclic vomiting syndrome. Her last episode was one week ago which lasted about 24 hours. She saw her primary care physician 2 days ago and had lab work drawn. She was contacted just today and told that her sodium was 128 and that she needed to go to the ED to have this corrected. She is denying symptomatology at the present time. She did feel "out of sorts" earlier. She has been eating regularly the past several days. She is on potassium supplementation at home.   Past Medical History:  Diagnosis Date  . Cyclic vomiting syndrome   . Myalgia   . Panic attack   . Renal disorder     Past Surgical History:  Procedure Laterality Date  . WISDOM TOOTH EXTRACTION      Family History  Problem Relation Age of Onset  . Hypertension Mother   . Hyperlipidemia Mother   . Kidney disease Father   . Hypertension Father   . Depression Father   . Diabetes Father   . Heart disease Father   . Stroke Maternal Grandmother   . Cancer Maternal Grandfather        lung  . Stroke Paternal Grandmother   . Heart disease Paternal Grandfather   . Cancer Paternal Aunt        breast  . Diabetes Paternal Aunt     Social History  Substance Use Topics  . Smoking status: Current Every Day Smoker    Packs/day: 2.00    Years: 17.00    Types: Cigarettes  . Smokeless tobacco: Never Used  . Alcohol use No    Prior to Admission medications   Medication Sig Start Date End Date Taking? Authorizing Provider  ALPRAZolam (XANAX) 0.25 MG tablet Take 0.25 mg by mouth 2 (two) times daily.     Daylene Posey, FNP  cyclobenzaprine (FLEXERIL) 10 MG tablet Take 10 mg by mouth 3 (three) times daily as needed for muscle spasms.    Daylene Posey, FNP  docusate sodium (COLACE) 100 MG capsule Take 1 capsule (100 mg total) by mouth every 12 (twelve) hours. 01/20/15   Julianne Rice, MD  ibuprofen (ADVIL,MOTRIN) 200 MG tablet Take 800 mg by mouth every 6 (six) hours as needed. For pain     [provider]  ondansetron (ZOFRAN) 4 MG tablet Take 1 tablet (4 mg total) by mouth every 6 (six) hours. 10/22/15   Cartner, Marland Kitchen, PA-C  Potassium Chloride ER 20 MEQ TBCR Take 40 mEq by mouth 2 (two) times daily. 03/15/15   Evelina Bucy, MD  Pseudoephedrine-Ibuprofen 30-200 MG TABS Take 1 Dose by mouth 2 (two) times daily. 10/22/15   Comer Locket, PA-C    Allergies Septra ds [sulfamethoxazole w/trimethoprim (co-trimoxazole)]; Sulfa antibiotics; Cheese; Corn-containing products; Eggs or egg-derived products; Peanut-containing drug products; and Shellfish allergy   REVIEW OF SYSTEMS  Negative except as noted here or in the History of Present Illness.   PHYSICAL EXAMINATION  Initial Vital Signs Blood pressure 117/79, pulse (!) 104, temperature 98.9 F (37.2 C), temperature source Oral, resp. rate 18, height 5\' 4"  (1.626 m), weight 59 kg (130 lb), last menstrual period 04/24/2017, SpO2 100 %.  Examination General: Well-developed, well-nourished female in no acute  distress; appearance consistent with age of record HENT: normocephalic; atraumatic Eyes: pupils equal, round and reactive to light; extraocular muscles intact Neck: supple Heart: regular rate and rhythm Lungs: clear to auscultation bilaterally Abdomen: soft; nondistended; nontender; bowel sounds present Extremities: No deformity; full range of motion; pulses normal Neurologic: Awake, alert and oriented; motor function intact in all extremities and symmetric; no facial droop Skin: Warm and dry Psychiatric: Normal mood and affect   RESULTS  Summary of this visit's results, reviewed by myself:   EKG Interpretation  Date/Time:    Ventricular Rate:    PR  Interval:    QRS Duration:   QT Interval:    QTC Calculation:   R Axis:     Text Interpretation:        Laboratory Studies: Results for orders placed or performed during the hospital encounter of 04/29/17 (from the past 24 hour(s))  Basic metabolic panel     Status: Abnormal   Collection Time: 04/29/17  1:43 AM  Result Value Ref Range   Sodium 132 (L) 135 - 145 mmol/L   Potassium 3.1 (L) 3.5 - 5.1 mmol/L   Chloride 89 (L) 101 - 111 mmol/L   CO2 32 22 - 32 mmol/L   Glucose, Bld 112 (H) 65 - 99 mg/dL   BUN 26 (H) 6 - 20 mg/dL   Creatinine, Ser 0.93 0.44 - 1.00 mg/dL   Calcium 9.1 8.9 - 10.3 mg/dL   GFR calc non Af Amer >60 >60 mL/min   GFR calc Af Amer >60 >60 mL/min   Anion gap 11 5 - 15   Imaging Studies: No results found.  ED COURSE  Nursing notes and initial vitals signs, including pulse oximetry, reviewed.  Vitals:   04/28/17 2310 04/28/17 2311  BP: 117/79   Pulse: (!) 104   Resp: 18   Temp: 98.9 F (37.2 C)   TempSrc: Oral   SpO2: 100%   Weight:  59 kg (130 lb)  Height:  5\' 4"  (1.626 m)    PROCEDURES    ED DIAGNOSES     ICD-10-CM   1. Hypokalemia E87.6   2. Hyponatremia E87.1        Brown Dunlap, Jenny Reichmann, MD 04/29/17 [phone]

## 2017-06-15 ENCOUNTER — Encounter: Payer: Self-pay | Admitting: Certified Nurse Midwife

## 2017-06-15 ENCOUNTER — Ambulatory Visit (INDEPENDENT_AMBULATORY_CARE_PROVIDER_SITE_OTHER): Payer: Medicaid Other | Admitting: Certified Nurse Midwife

## 2017-06-15 ENCOUNTER — Other Ambulatory Visit (HOSPITAL_COMMUNITY)
Admission: RE | Admit: 2017-06-15 | Discharge: 2017-06-15 | Disposition: A | Discharge status: Home / Self Care | Payer: Medicaid Other | Source: Ambulatory Visit | Attending: Certified Nurse Midwife | Admitting: Certified Nurse Midwife

## 2017-06-15 VITALS — BP 124/74 | HR 96 | Wt 138.0 lb

## 2017-06-15 DIAGNOSIS — Z3009 Encounter for other general counseling and advice on contraception: Secondary | ICD-10-CM

## 2017-06-15 DIAGNOSIS — Z113 Encounter for screening for infections with a predominantly sexual mode of transmission: Secondary | ICD-10-CM | POA: Diagnosis not present

## 2017-06-15 DIAGNOSIS — Z30011 Encounter for initial prescription of contraceptive pills: Secondary | ICD-10-CM

## 2017-06-15 MED ORDER — NORGESTIMATE-ETH ESTRADIOL 0.25-35 MG-MCG PO TABS: 1.0000 | ORAL_TABLET | Freq: Every day | ORAL | 11 refills | Status: DC

## 2017-06-15 NOTE — Progress Notes (Signed)
Subjective:    Natalie Joseph is a 36 y.o. female who presents for contraception counseling. The patient has no complaints today. The patient is sexually active. Pertinent past medical history: current smoker.  Has been trying to quit.  OCPs for her acne.  Only one partner for the last 15 years.  Denies any change in discharge, desires STD screening.  Reports normal periods.   The information documented in the HPI was reviewed and verified.  Menstrual History: OB History    No data available       Patient's last menstrual period was 06/10/2017 (exact date).   Patient Active Problem List   Diagnosis Date Noted  . Smoker 06/09/2013  . Screen for STD (sexually transmitted disease) 06/09/2013   Past Medical History:  Diagnosis Date  . Cyclic vomiting syndrome   . Myalgia   . Panic attack   . Renal disorder     Past Surgical History:  Procedure Laterality Date  . WISDOM TOOTH EXTRACTION       Current Outpatient Prescriptions:  .  ALPRAZolam (XANAX) 0.25 MG tablet, Take 0.25 mg by mouth 2 (two) times daily. , Disp: , Rfl:  .  Alum & Mag Hydroxide-Simeth (GI COCKTAIL) SUSP suspension, Take 30 mLs by mouth 2 (two) times daily as needed for indigestion. Shake well., Disp: , Rfl:  .  cyclobenzaprine (FLEXERIL) 10 MG tablet, Take 10 mg by mouth 3 (three) times daily as needed for muscle spasms., Disp: , Rfl:  .  docusate sodium (COLACE) 100 MG capsule, Take 1 capsule (100 mg total) by mouth every 12 (twelve) hours., Disp: 60 capsule, Rfl: 0 .  esomeprazole (NEXIUM) 40 MG capsule, Take 40 mg by mouth 2 (two) times daily before a meal., Disp: , Rfl:  .  metoCLOPramide (REGLAN) 10 MG tablet, Take 10 mg by mouth every 6 (six) hours as needed for nausea., Disp: , Rfl:  .  ondansetron (ZOFRAN ODT) 4 MG disintegrating tablet, Take 4 mg by mouth every 8 (eight) hours as needed for nausea or vomiting., Disp: , Rfl:  .  Potassium Chloride ER 20 MEQ TBCR, Take 40 mEq by mouth 2 (two) times  daily., Disp: 16 tablet, Rfl: 0 .  promethazine (PHENERGAN) 25 MG tablet, Take 25 mg by mouth every 6 (six) hours as needed for nausea or vomiting., Disp: , Rfl:  .  norgestimate-ethinyl estradiol (ORTHO-CYCLEN,SPRINTEC,PREVIFEM) 0.25-35 MG-MCG tablet, Take 1 tablet by mouth daily., Disp: 1 Package, Rfl: 11 Allergies  Allergen Reactions  . Septra Ds [Sulfamethoxazole W/Trimethoprim (Co-Trimoxazole)] Nausea And Vomiting  . Sulfa Antibiotics Nausea And Vomiting  . Cheese   . Corn-Containing Products   . Eggs Or Egg-Derived Products   . Peanut-Containing Drug Products   . Shellfish Allergy     Social History  Substance Use Topics  . Smoking status: Current Every Day Smoker    Packs/day: 2.00    Years: 17.00    Types: Cigarettes  . Smokeless tobacco: Never Used  . Alcohol use No    Family History  Problem Relation Age of Onset  . Hypertension Mother   . Hyperlipidemia Mother   . Kidney disease Father   . Hypertension Father   . Depression Father   . Diabetes Father   . Heart disease Father   . Stroke Maternal Grandmother   . Cancer Maternal Grandfather        lung  . Stroke Paternal Grandmother   . Heart disease Paternal Grandfather   . Cancer Paternal Aunt  breast  . Diabetes Paternal Aunt        Review of Systems Constitutional: negative for weight loss Genitourinary:negative for abnormal menstrual periods and vaginal discharge    Objective:   BP 124/74   Pulse 96   Wt 138 lb (62.6 kg)   LMP 06/10/2017 (Exact Date)   BMI 23.69 kg/m    General:   alert  Skin:   no rash or abnormalities  Lungs:   clear to auscultation bilaterally  Heart:   regular rate and rhythm, S1, S2 normal, no murmur, click, rub or gallop  Breasts:   normal without suspicious masses, skin or nipple changes or axillary nodes  Abdomen:  normal findings: no organomegaly, soft, non-tender and no hernia  Pelvis:  External genitalia: normal general appearance Urinary system: urethral  meatus normal and bladder without fullness, nontender Vaginal: normal without tenderness, induration or masses Cervix: normal appearance Adnexa: normal bimanual exam Uterus: anteverted and non-tender, normal size   Lab Review Urine pregnancy test Labs reviewed yes Radiologic studies reviewed no  50% of 20 min visit spent on counseling and coordination of care.    Assessment:    36 y.o., starting OCP (estrogen/progesterone), contraindications smoking, ACHES reviewed..   Plan:    All questions answered.  Meds ordered this encounter  Medications  . Alum & Mag Hydroxide-Simeth (GI COCKTAIL) SUSP suspension    Sig: Take 30 mLs by mouth 2 (two) times daily as needed for indigestion. Shake well.  . metoCLOPramide (REGLAN) 10 MG tablet    Sig: Take 10 mg by mouth every 6 (six) hours as needed for nausea.  . ondansetron (ZOFRAN ODT) 4 MG disintegrating tablet    Sig: Take 4 mg by mouth every 8 (eight) hours as needed for nausea or vomiting.  . promethazine (PHENERGAN) 25 MG tablet    Sig: Take 25 mg by mouth every 6 (six) hours as needed for nausea or vomiting.  Marland Kitchen esomeprazole (NEXIUM) 40 MG capsule    Sig: Take 40 mg by mouth 2 (two) times daily before a meal.  . norgestimate-ethinyl estradiol (ORTHO-CYCLEN,SPRINTEC,PREVIFEM) 0.25-35 MG-MCG tablet    Sig: Take 1 tablet by mouth daily.    Dispense:  1 Package    Refill:  11    Need to obtain previous records Follow up as needed.

## 2017-06-15 NOTE — Addendum Note (Signed)
Addended by: Dorothyann Gibbs on: 06/15/2017 01:35 PM   Modules accepted: Orders

## 2017-06-16 LAB — CERVICOVAGINAL ANCILLARY ONLY
Bacterial vaginitis: NEGATIVE
Candida vaginitis: NEGATIVE
Chlamydia: NEGATIVE
Neisseria Gonorrhea: NEGATIVE
Trichomonas: NEGATIVE

## 2017-06-18 LAB — RPR: RPR Ser Ql: NONREACTIVE

## 2017-06-18 LAB — HIV ANTIBODY (ROUTINE TESTING W REFLEX): HIV Screen 4th Generation wRfx: NONREACTIVE

## 2017-06-18 LAB — HEPATITIS C ANTIBODY: Hep C Virus Ab: <0.1 s/co ratio (ref 0.0–0.9)

## 2017-06-18 LAB — HSV 1 AND 2 IGM ABS, INDIRECT
HSV 1 IgM: <1:10 {titer}
HSV 2 IgM: <1:10 {titer}

## 2017-06-18 LAB — HEPATITIS B SURFACE ANTIGEN: Hepatitis B Surface Ag: NEGATIVE

## 2017-06-24 ENCOUNTER — Ambulatory Visit: Payer: Medicaid Other | Admitting: Certified Nurse Midwife

## 2017-07-01 ENCOUNTER — Other Ambulatory Visit (HOSPITAL_COMMUNITY)
Admission: RE | Admit: 2017-07-01 | Discharge: 2017-07-01 | Disposition: A | Discharge status: Home / Self Care | Payer: Medicaid Other | Source: Ambulatory Visit | Attending: Certified Nurse Midwife | Admitting: Certified Nurse Midwife

## 2017-07-01 ENCOUNTER — Encounter: Payer: Self-pay | Admitting: Certified Nurse Midwife

## 2017-07-01 ENCOUNTER — Ambulatory Visit (INDEPENDENT_AMBULATORY_CARE_PROVIDER_SITE_OTHER): Payer: Medicaid Other | Admitting: Certified Nurse Midwife

## 2017-07-01 VITALS — BP 119/83 | HR 93 | Wt 139.0 lb

## 2017-07-01 DIAGNOSIS — N898 Other specified noninflammatory disorders of vagina: Secondary | ICD-10-CM | POA: Diagnosis not present

## 2017-07-01 DIAGNOSIS — Z202 Contact with and (suspected) exposure to infections with a predominantly sexual mode of transmission: Secondary | ICD-10-CM | POA: Insufficient documentation

## 2017-07-01 MED ORDER — FLUCONAZOLE 200 MG PO TABS: 200.0000 mg | ORAL_TABLET | Freq: Once | ORAL | 0 refills | Status: AC

## 2017-07-01 NOTE — Progress Notes (Signed)
Patient is in the office for possible yeast infection. Pt states she is unsure if she has a std, complains of vaginal irritation and burning. Pt requests all std testing.
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
## 2017-07-02 LAB — CERVICOVAGINAL ANCILLARY ONLY
Bacterial vaginitis: NEGATIVE
Candida vaginitis: NEGATIVE
Chlamydia: NEGATIVE
Neisseria Gonorrhea: NEGATIVE
Trichomonas: NEGATIVE

## 2017-07-06 LAB — HIV ANTIBODY (ROUTINE TESTING W REFLEX): HIV Screen 4th Generation wRfx: NONREACTIVE

## 2017-07-06 LAB — HEPATITIS C ANTIBODY: Hep C Virus Ab: <0.1 s/co ratio (ref 0.0–0.9)

## 2017-07-06 LAB — HSV 1 AND 2 IGM ABS, INDIRECT
HSV 1 IgM: 1:100 {titer} — ABNORMAL HIGH
HSV 2 IgM: <1:10 {titer}

## 2017-07-06 LAB — RPR: RPR Ser Ql: NONREACTIVE

## 2017-07-06 LAB — HEPATITIS B SURFACE ANTIGEN: Hepatitis B Surface Ag: NEGATIVE

## 2017-09-13 ENCOUNTER — Other Ambulatory Visit: Payer: Self-pay

## 2017-09-13 ENCOUNTER — Encounter (HOSPITAL_BASED_OUTPATIENT_CLINIC_OR_DEPARTMENT_OTHER): Payer: Self-pay

## 2017-09-13 ENCOUNTER — Emergency Department (HOSPITAL_BASED_OUTPATIENT_CLINIC_OR_DEPARTMENT_OTHER)
Admission: EM | Admit: 2017-09-13 | Discharge: 2017-09-14 | Disposition: A | Discharge status: Home / Self Care | Payer: Medicaid Other | Attending: Emergency Medicine | Admitting: Emergency Medicine

## 2017-09-13 DIAGNOSIS — F17210 Nicotine dependence, cigarettes, uncomplicated: Secondary | ICD-10-CM | POA: Diagnosis not present

## 2017-09-13 DIAGNOSIS — G43A0 Cyclical vomiting, not intractable: Secondary | ICD-10-CM | POA: Insufficient documentation

## 2017-09-13 DIAGNOSIS — R1110 Vomiting, unspecified: Secondary | ICD-10-CM | POA: Diagnosis present

## 2017-09-13 DIAGNOSIS — Z79899 Other long term (current) drug therapy: Secondary | ICD-10-CM | POA: Insufficient documentation

## 2017-09-13 DIAGNOSIS — E876 Hypokalemia: Secondary | ICD-10-CM | POA: Diagnosis not present

## 2017-09-13 DIAGNOSIS — R1115 Cyclical vomiting syndrome unrelated to migraine: Secondary | ICD-10-CM

## 2017-09-13 LAB — COMPREHENSIVE METABOLIC PANEL WITH GFR
ALT: 19 U/L (ref 14–54)
AST: 28 U/L (ref 15–41)
Albumin: 4.3 g/dL (ref 3.5–5.0)
Alkaline Phosphatase: 81 U/L (ref 38–126)
Anion gap: 11 (ref 5–15)
BUN: 13 mg/dL (ref 6–20)
CO2: 26 mmol/L (ref 22–32)
Calcium: 9.5 mg/dL (ref 8.9–10.3)
Chloride: 98 mmol/L — ABNORMAL LOW (ref 101–111)
Creatinine, Ser: 0.84 mg/dL (ref 0.44–1.00)
GFR calc Af Amer: >60 mL/min
GFR calc non Af Amer: >60 mL/min
Glucose, Bld: 119 mg/dL — ABNORMAL HIGH (ref 65–99)
Potassium: 3 mmol/L — ABNORMAL LOW (ref 3.5–5.1)
Sodium: 135 mmol/L (ref 135–145)
Total Bilirubin: 0.7 mg/dL (ref 0.3–1.2)
Total Protein: 7.7 g/dL (ref 6.5–8.1)

## 2017-09-13 LAB — CBC
HCT: 40.8 % (ref 36.0–46.0)
Hemoglobin: 14.1 g/dL (ref 12.0–15.0)
MCH: 32.6 pg (ref 26.0–34.0)
MCHC: 34.6 g/dL (ref 30.0–36.0)
MCV: 94.2 fL (ref 78.0–100.0)
Platelets: 373 10*3/uL (ref 150–400)
RBC: 4.33 MIL/uL (ref 3.87–5.11)
RDW: 12.3 % (ref 11.5–15.5)
WBC: 14.5 10*3/uL — ABNORMAL HIGH (ref 4.0–10.5)

## 2017-09-13 LAB — TROPONIN I: Troponin I: <0.03 ng/mL (ref <0.03)

## 2017-09-13 LAB — LIPASE, BLOOD: Lipase: 27 U/L (ref 11–51)

## 2017-09-13 NOTE — ED Triage Notes (Signed)
Pt states she was sen today for "cyclic vomiting" at [facility]-states she had "black stuff"-states she had "coffee ground" last month and had upper GI in Dec 2018-pt states she also had episode of CP last Thursday-NAD-steady gait

## 2017-09-14 LAB — URINALYSIS, ROUTINE W REFLEX MICROSCOPIC
Glucose, UA: NEGATIVE mg/dL
Hgb urine dipstick: NEGATIVE
Ketones, ur: 15 mg/dL — AB
Leukocytes, UA: NEGATIVE
Nitrite: NEGATIVE
Protein, ur: NEGATIVE mg/dL
Specific Gravity, Urine: 1.015 (ref 1.005–1.030)
pH: 6.5 (ref 5.0–8.0)

## 2017-09-14 LAB — PREGNANCY, URINE: Preg Test, Ur: NEGATIVE

## 2017-09-14 MED ORDER — POTASSIUM CHLORIDE 20 MEQ/15ML (10%) PO SOLN
40.0000 meq | Freq: Once | ORAL | Status: AC
  Administered 2017-09-14: 40 meq via ORAL
  Filled 2017-09-14: qty 30
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
## 2017-09-14 NOTE — ED Notes (Signed)
Patient unable to provided urine sample at this time. Patient drinking ginger ale to try to obtain urine sample. EDP aware at this time.

## 2017-09-14 NOTE — ED Notes (Signed)
ED Provider at bedside. 

## 2017-09-22 ENCOUNTER — Ambulatory Visit: Payer: Medicaid Other | Admitting: Certified Nurse Midwife

## 2017-09-23 ENCOUNTER — Encounter: Payer: Self-pay | Admitting: Certified Nurse Midwife

## 2017-09-23 ENCOUNTER — Ambulatory Visit: Payer: Medicaid Other | Admitting: Certified Nurse Midwife

## 2017-09-23 VITALS — BP 133/81 | HR 100 | Wt 140.0 lb

## 2017-09-23 DIAGNOSIS — R238 Other skin changes: Secondary | ICD-10-CM | POA: Diagnosis not present
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
## 2017-10-12 ENCOUNTER — Telehealth: Payer: Self-pay

## 2017-10-12 NOTE — Telephone Encounter (Signed)
TC to pt regarding message pt states she cannot use latex condoms Pt advised to try sheep skin condoms Pt voiced understanding.

## 2017-10-21 ENCOUNTER — Other Ambulatory Visit: Payer: Self-pay

## 2017-10-21 ENCOUNTER — Encounter: Payer: Self-pay | Admitting: Certified Nurse Midwife

## 2017-10-21 ENCOUNTER — Other Ambulatory Visit (HOSPITAL_COMMUNITY)
Admission: RE | Admit: 2017-10-21 | Discharge: 2017-10-21 | Disposition: A | Discharge status: Home / Self Care | Payer: Medicaid Other | Source: Ambulatory Visit | Attending: Certified Nurse Midwife | Admitting: Certified Nurse Midwife

## 2017-10-21 ENCOUNTER — Ambulatory Visit: Payer: Medicaid Other | Admitting: Certified Nurse Midwife

## 2017-10-21 VITALS — BP 119/79 | HR 100 | Wt 135.6 lb

## 2017-10-21 DIAGNOSIS — Z7251 High risk heterosexual behavior: Secondary | ICD-10-CM

## 2017-10-21 DIAGNOSIS — Z113 Encounter for screening for infections with a predominantly sexual mode of transmission: Secondary | ICD-10-CM

## 2017-10-21 DIAGNOSIS — F17200 Nicotine dependence, unspecified, uncomplicated: Secondary | ICD-10-CM

## 2017-10-21 DIAGNOSIS — B9689 Other specified bacterial agents as the cause of diseases classified elsewhere: Secondary | ICD-10-CM | POA: Diagnosis not present

## 2017-10-21 DIAGNOSIS — N6311 Unspecified lump in the right breast, upper outer quadrant: Secondary | ICD-10-CM

## 2017-10-21 DIAGNOSIS — B373 Candidiasis of vulva and vagina: Secondary | ICD-10-CM | POA: Diagnosis not present

## 2017-10-21 NOTE — Progress Notes (Signed)
Presents for AEX/PAP/STD Testing. Having problems voiding after intercourse.  Lump in right  Breast between 9-11 o'clock. Insomnia
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
## 2017-10-22 LAB — CERVICOVAGINAL ANCILLARY ONLY
Bacterial vaginitis: NEGATIVE
Candida vaginitis: POSITIVE — AB
Chlamydia: NEGATIVE
Neisseria Gonorrhea: NEGATIVE
Trichomonas: NEGATIVE

## 2017-10-22 LAB — HEPATITIS B SURFACE ANTIGEN: Hepatitis B Surface Ag: NEGATIVE

## 2017-10-22 LAB — HEPATITIS C ANTIBODY: Hep C Virus Ab: <0.1 s/co ratio (ref 0.0–0.9)

## 2017-10-22 LAB — RPR: RPR Ser Ql: NONREACTIVE

## 2017-10-22 LAB — HIV ANTIBODY (ROUTINE TESTING W REFLEX): HIV Screen 4th Generation wRfx: NONREACTIVE

## 2017-10-23 ENCOUNTER — Other Ambulatory Visit: Payer: Self-pay | Admitting: Certified Nurse Midwife

## 2017-10-23 DIAGNOSIS — B373 Candidiasis of vulva and vagina: Secondary | ICD-10-CM

## 2017-10-23 DIAGNOSIS — B3731 Acute candidiasis of vulva and vagina: Secondary | ICD-10-CM

## 2017-10-23 MED ORDER — FLUCONAZOLE 200 MG PO TABS: 200.0000 mg | ORAL_TABLET | Freq: Once | ORAL | 0 refills | Status: AC

## 2017-10-23 MED ORDER — TERCONAZOLE 0.8 % VA CREA: 1.0000 | TOPICAL_CREAM | Freq: Every day | VAGINAL | 0 refills | Status: DC

## 2017-10-27 ENCOUNTER — Ambulatory Visit
Admission: RE | Admit: 2017-10-27 | Discharge: 2017-10-27 | Disposition: A | Discharge status: Home / Self Care | Payer: Medicaid Other | Source: Ambulatory Visit | Attending: Certified Nurse Midwife | Admitting: Certified Nurse Midwife

## 2017-10-27 ENCOUNTER — Other Ambulatory Visit: Payer: Medicaid Other

## 2017-10-27 DIAGNOSIS — N6311 Unspecified lump in the right breast, upper outer quadrant: Secondary | ICD-10-CM

## 2017-11-16 ENCOUNTER — Encounter: Payer: Self-pay | Admitting: Certified Nurse Midwife

## 2017-11-16 ENCOUNTER — Ambulatory Visit: Payer: Medicaid Other | Admitting: Certified Nurse Midwife

## 2017-11-16 VITALS — BP 128/83 | HR 91 | Ht 64.0 in | Wt 142.0 lb

## 2017-11-16 DIAGNOSIS — N764 Abscess of vulva: Secondary | ICD-10-CM

## 2017-11-16 DIAGNOSIS — L0292 Furuncle, unspecified: Secondary | ICD-10-CM

## 2017-11-16 DIAGNOSIS — Z7189 Other specified counseling: Secondary | ICD-10-CM

## 2017-11-16 DIAGNOSIS — Z7251 High risk heterosexual behavior: Secondary | ICD-10-CM

## 2017-11-16 DIAGNOSIS — Z7185 Encounter for immunization safety counseling: Secondary | ICD-10-CM

## 2017-11-16 DIAGNOSIS — N63 Unspecified lump in unspecified breast: Secondary | ICD-10-CM

## 2017-11-16 DIAGNOSIS — Z23 Encounter for immunization: Secondary | ICD-10-CM

## 2017-11-16 NOTE — Progress Notes (Signed)
Pt is in the office for GYN visit, pt states that she has a cyst on left labia. Administered gardasil and pt tolerated well.

## 2017-11-17 ENCOUNTER — Encounter: Payer: Self-pay | Admitting: Certified Nurse Midwife

## 2017-11-17 DIAGNOSIS — N63 Unspecified lump in unspecified breast: Secondary | ICD-10-CM | POA: Insufficient documentation

## 2017-11-17 NOTE — Progress Notes (Signed)
Patient ID: Natalie Joseph, female   DOB: 12-13-1980, 37 y.o.   MRN: [id number]  Chief Complaint  Patient presents with  . GYN    HPI Natalie Joseph is a 37 y.o. female.  Here for treatment of furuncles on labia.  Desires to start on HPV vaccine series.  Had breast US recently, is watching. Encouraged to quit smoking.  Has been smoking since she was 37 years old.  Denies any symptoms of STI/declines testing today.  Has a stable female partner.    HPI  Past Medical History:  Diagnosis Date  . Cyclic vomiting syndrome   . Myalgia   . Panic attack   . Renal disorder     Past Surgical History:  Procedure Laterality Date  . WISDOM TOOTH EXTRACTION      Family History  Problem Relation Age of Onset  . Hypertension Mother   . Hyperlipidemia Mother   . Kidney disease Father   . Hypertension Father   . Depression Father   . Diabetes Father   . Heart disease Father   . Stroke Maternal Grandmother   . Cancer Maternal Grandfather        lung  . Stroke Paternal Grandmother   . Heart disease Paternal Grandfather   . Cancer Paternal Aunt        breast  . Diabetes Paternal Aunt     Social History Social History   Tobacco Use  . Smoking status: Current Every Day Smoker    Packs/day: 2.00    Years: 17.00    Pack years: 34.00    Types: Cigarettes  . Smokeless tobacco: Never Used  Substance Use Topics  . Alcohol use: No  . Drug use: No    Allergies  Allergen Reactions  . Septra Ds [Sulfamethoxazole W/Trimethoprim (Co-Trimoxazole)] Nausea And Vomiting  . Sulfa Antibiotics Nausea And Vomiting  . Cheese   . Corn-Containing Products   . Eggs Or Egg-Derived Products   . Latex   . Milk-Related Compounds   . Peanut-Containing Drug Products   . Shellfish Allergy     Current Outpatient Medications  Medication Sig Dispense Refill  . ALPRAZolam (XANAX) 0.25 MG tablet Take 0.25 mg by mouth 2 (two) times daily.     . Alum & Mag Hydroxide-Simeth (GI COCKTAIL) SUSP suspension  Take 30 mLs by mouth 2 (two) times daily as needed for indigestion. Shake well.    . cyclobenzaprine (FLEXERIL) 10 MG tablet Take 10 mg by mouth 3 (three) times daily as needed for muscle spasms.    Marland Kitchen docusate sodium (COLACE) 100 MG capsule Take 1 capsule (100 mg total) by mouth every 12 (twelve) hours. 60 capsule 0  . esomeprazole (NEXIUM) 40 MG capsule Take 40 mg by mouth 2 (two) times daily before a meal.    . HYDROcodone-acetaminophen (NORCO) 10-325 MG tablet Take 1 tablet by mouth every 6 (six) hours as needed.    . metoCLOPramide (REGLAN) 10 MG tablet Take 10 mg by mouth every 6 (six) hours as needed for nausea.    . norgestimate-ethinyl estradiol (ORTHO-CYCLEN,SPRINTEC,PREVIFEM) 0.25-35 MG-MCG tablet Take 1 tablet by mouth daily. 1 Package 11  . ondansetron (ZOFRAN ODT) 4 MG disintegrating tablet Take 4 mg by mouth every 8 (eight) hours as needed for nausea or vomiting.    . penicillin v potassium (VEETID) 250 MG tablet Take 250 mg by mouth 4 (four) times daily.    . Potassium Chloride ER 20 MEQ TBCR Take 40 mEq by mouth 2 (  two) times daily. 16 tablet 0  . promethazine (PHENERGAN) 25 MG tablet Take 25 mg by mouth every 6 (six) hours as needed for nausea or vomiting.    Marland Kitchen terconazole (TERAZOL 3) 0.8 % vaginal cream Place 1 applicator vaginally at bedtime. 20 g 0   No current facility-administered medications for this visit.     Review of Systems Review of Systems Constitutional: negative for fatigue and weight loss Respiratory: negative for cough and wheezing Cardiovascular: negative for chest pain, fatigue and palpitations Gastrointestinal: negative for abdominal pain and change in bowel habits Genitourinary:negative Integument/breast: negative for nipple discharge Musculoskeletal:negative for myalgias Neurological: negative for gait problems and tremors Behavioral/Psych: negative for abusive relationship, depression Endocrine: negative for temperature intolerance      Blood  pressure 128/83, pulse 91, height 5\' 4"  (1.626 m), weight 142 lb (64.4 kg), last menstrual period 11/10/2017.  Physical Exam Physical Exam General:   alert  Skin:   no rash or abnormalities  Lungs:   clear to auscultation bilaterally  Heart:   regular rate and rhythm, S1, S2 normal, no murmur, click, rub or gallop  Breasts:   deferred  Abdomen:  normal findings: no organomegaly, soft, non-tender and no hernia  Pelvis:  External genitalia: normal general appearance, 2 small white furuncles treated right lower and left lower labia, size of tip of a pen.  Treated with TCA.  Tolerated well.  Urinary system: urethral meatus normal and bladder without fullness, nontender Vaginal: normal without tenderness, induration or masses     50% of 20 min visit spent on counseling and coordination of care.   Data Reviewed Previous medical records, meds, [place]: breast  Assessment     Labial furuncles High risk HPV exposure Breast lump    Plan   Started on HPV vaccine series   Furuncles treated with TCA, tolerated well  Orders Placed This Encounter  Procedures  . HPV 9-valent vaccine,Recombinat   Continue with breast surveillance at the breast center.    Follow up as needed.

## 2018-01-17 ENCOUNTER — Encounter: Payer: Self-pay | Admitting: Certified Nurse Midwife

## 2018-01-17 ENCOUNTER — Ambulatory Visit: Payer: Medicaid Other

## 2018-01-17 ENCOUNTER — Other Ambulatory Visit (HOSPITAL_COMMUNITY)
Admission: RE | Admit: 2018-01-17 | Discharge: 2018-01-17 | Disposition: A | Discharge status: Home / Self Care | Payer: Medicaid Other | Source: Ambulatory Visit | Attending: Certified Nurse Midwife | Admitting: Certified Nurse Midwife

## 2018-01-17 ENCOUNTER — Ambulatory Visit: Payer: Medicaid Other | Admitting: Certified Nurse Midwife

## 2018-01-17 VITALS — BP 121/90 | HR 137 | Wt 139.6 lb

## 2018-01-17 DIAGNOSIS — Z113 Encounter for screening for infections with a predominantly sexual mode of transmission: Secondary | ICD-10-CM | POA: Diagnosis present

## 2018-01-17 DIAGNOSIS — Z23 Encounter for immunization: Secondary | ICD-10-CM | POA: Diagnosis not present

## 2018-01-17 NOTE — Progress Notes (Signed)
Patient ID: Natalie Joseph, female   DOB: 1980/09/15, 36 y.o.   MRN: [id number]  Chief Complaint  Patient presents with  . Injections    /gardasil Injection   . Exposure to STD    HPI Natalie Joseph is a 37 y.o. female.  Had annual exam 09/16/16.  Broke up with boyfriend.  Desires full STD screening. Last Pap smear; 09/16/16: negative.   HPI  Past Medical History:  Diagnosis Date  . Cyclic vomiting syndrome   . Myalgia   . Panic attack   . Renal disorder     Past Surgical History:  Procedure Laterality Date  . WISDOM TOOTH EXTRACTION      Family History  Problem Relation Age of Onset  . Hypertension Mother   . Hyperlipidemia Mother   . Kidney disease Father   . Hypertension Father   . Depression Father   . Diabetes Father   . Heart disease Father   . Stroke Maternal Grandmother   . Cancer Maternal Grandfather        lung  . Stroke Paternal Grandmother   . Heart disease Paternal Grandfather   . Cancer Paternal Aunt        breast  . Diabetes Paternal Aunt     Social History Social History   Tobacco Use  . Smoking status: Current Every Day Smoker    Packs/day: 2.00    Years: 17.00    Pack years: 34.00    Types: Cigarettes  . Smokeless tobacco: Never Used  Substance Use Topics  . Alcohol use: No  . Drug use: No    Allergies  Allergen Reactions  . Septra Ds [Sulfamethoxazole W/Trimethoprim (Co-Trimoxazole)] Nausea And Vomiting  . Sulfa Antibiotics Nausea And Vomiting  . Cheese   . Corn-Containing Products   . Eggs Or Egg-Derived Products   . Latex   . Milk-Related Compounds   . Peanut-Containing Drug Products   . Shellfish Allergy     Current Outpatient Medications  Medication Sig Dispense Refill  . ALPRAZolam (XANAX) 0.25 MG tablet Take 0.25 mg by mouth 2 (two) times daily.     . Alum & Mag Hydroxide-Simeth (GI COCKTAIL) SUSP suspension Take 30 mLs by mouth 2 (two) times daily as needed for indigestion. Shake well.    . cyclobenzaprine  (FLEXERIL) 10 MG tablet Take 10 mg by mouth 3 (three) times daily as needed for muscle spasms.    Marland Kitchen docusate sodium (COLACE) 100 MG capsule Take 1 capsule (100 mg total) by mouth every 12 (twelve) hours. 60 capsule 0  . esomeprazole (NEXIUM) 40 MG capsule Take 40 mg by mouth 2 (two) times daily before a meal.    . HYDROcodone-acetaminophen (NORCO) 10-325 MG tablet Take 1 tablet by mouth every 6 (six) hours as needed.    . metoCLOPramide (REGLAN) 10 MG tablet Take 10 mg by mouth every 6 (six) hours as needed for nausea.    . norgestimate-ethinyl estradiol (ORTHO-CYCLEN,SPRINTEC,PREVIFEM) 0.25-35 MG-MCG tablet Take 1 tablet by mouth daily. 1 Package 11  . ondansetron (ZOFRAN ODT) 4 MG disintegrating tablet Take 4 mg by mouth every 8 (eight) hours as needed for nausea or vomiting.    . penicillin v potassium (VEETID) 250 MG tablet Take 250 mg by mouth 4 (four) times daily.    . Potassium Chloride ER 20 MEQ TBCR Take 40 mEq by mouth 2 (two) times daily. 16 tablet 0  . promethazine (PHENERGAN) 25 MG tablet Take 25 mg by mouth every 6 (six)  hours as needed for nausea or vomiting.    Marland Kitchen terconazole (TERAZOL 3) 0.8 % vaginal cream Place 1 applicator vaginally at bedtime. (Patient not taking: Reported on 01/17/2018) 20 g 0   No current facility-administered medications for this visit.     Review of Systems Review of Systems Constitutional: negative for fatigue and weight loss Respiratory: negative for cough and wheezing Cardiovascular: negative for chest pain, fatigue and palpitations Gastrointestinal: negative for abdominal pain and change in bowel habits Genitourinary:negative Integument/breast: negative for nipple discharge Musculoskeletal:negative for myalgias Neurological: negative for gait problems and tremors Behavioral/Psych: negative for abusive relationship, depression Endocrine: negative for temperature intolerance      Blood pressure 121/90, pulse (!) 137, weight 139 lb 9.6 oz (63.3 kg),  last menstrual period 01/04/2018.  Physical Exam Physical Exam General:   alert  Skin:   no rash or abnormalities  Lungs:   clear to auscultation bilaterally  Heart:   regular rate and rhythm, S1, S2 normal, no murmur, click, rub or gallop  Breasts:   deferred  Abdomen:  normal findings: no organomegaly, soft, non-tender and no hernia  Pelvis:  External genitalia: normal general appearance Urinary system: urethral meatus normal and bladder without fullness, nontender     50% of 15 min visit spent on counseling and coordination of care.   Data Reviewed Previous medical hx, meds, labs  Assessment     1. Screening examination for STD (sexually transmitted disease)   - Hepatitis B surface antigen - Hepatitis C antibody - HIV antibody - RPR - Cervicovaginal ancillary only  2. Need for HPV vaccination   - HPV 9-valent vaccine,Recombinat  3. Screen for STD (sexually transmitted disease)        Plan   Completed HPV series.   Orders Placed This Encounter  Procedures  . HPV 9-valent vaccine,Recombinat  . Hepatitis B surface antigen  . Hepatitis C antibody  . HIV antibody  . RPR     Follow up as needed/annual exam.

## 2018-01-17 NOTE — Progress Notes (Signed)
Pt presents for GYN visit today.  Pt requestng full STD panel.

## 2018-01-18 LAB — HEPATITIS B SURFACE ANTIGEN: Hepatitis B Surface Ag: NEGATIVE

## 2018-01-18 LAB — CERVICOVAGINAL ANCILLARY ONLY
Bacterial vaginitis: NEGATIVE
Candida vaginitis: NEGATIVE
Chlamydia: NEGATIVE
Neisseria Gonorrhea: NEGATIVE
Trichomonas: NEGATIVE

## 2018-01-18 LAB — HEPATITIS C ANTIBODY: Hep C Virus Ab: <0.1 s/co ratio (ref 0.0–0.9)

## 2018-01-18 LAB — RPR: RPR Ser Ql: NONREACTIVE

## 2018-01-18 LAB — HIV ANTIBODY (ROUTINE TESTING W REFLEX): HIV Screen 4th Generation wRfx: NONREACTIVE

## 2018-02-14 ENCOUNTER — Emergency Department (HOSPITAL_BASED_OUTPATIENT_CLINIC_OR_DEPARTMENT_OTHER)
Admission: EM | Admit: 2018-02-14 | Discharge: 2018-02-14 | Disposition: A | Discharge status: Home / Self Care | Payer: Medicaid Other | Attending: Emergency Medicine | Admitting: Emergency Medicine

## 2018-02-14 ENCOUNTER — Other Ambulatory Visit: Payer: Self-pay

## 2018-02-14 ENCOUNTER — Encounter (HOSPITAL_BASED_OUTPATIENT_CLINIC_OR_DEPARTMENT_OTHER): Payer: Self-pay | Admitting: *Deleted

## 2018-02-14 DIAGNOSIS — Z91040 Latex allergy status: Secondary | ICD-10-CM | POA: Diagnosis not present

## 2018-02-14 DIAGNOSIS — Z91010 Allergy to peanuts: Secondary | ICD-10-CM | POA: Diagnosis not present

## 2018-02-14 DIAGNOSIS — N3000 Acute cystitis without hematuria: Secondary | ICD-10-CM

## 2018-02-14 DIAGNOSIS — R300 Dysuria: Secondary | ICD-10-CM | POA: Diagnosis present

## 2018-02-14 DIAGNOSIS — F17210 Nicotine dependence, cigarettes, uncomplicated: Secondary | ICD-10-CM | POA: Diagnosis not present

## 2018-02-14 DIAGNOSIS — Z79899 Other long term (current) drug therapy: Secondary | ICD-10-CM | POA: Insufficient documentation

## 2018-02-14 LAB — URINALYSIS, ROUTINE W REFLEX MICROSCOPIC
Bilirubin Urine: NEGATIVE
Glucose, UA: NEGATIVE mg/dL
Ketones, ur: 15 mg/dL — AB
Nitrite: NEGATIVE
Protein, ur: NEGATIVE mg/dL
Specific Gravity, Urine: 1.02 (ref 1.005–1.030)
pH: 6 (ref 5.0–8.0)

## 2018-02-14 LAB — URINALYSIS, MICROSCOPIC (REFLEX)

## 2018-02-14 MED ORDER — CEFTRIAXONE SODIUM 1 G IJ SOLR
1.0000 g | Freq: Once | INTRAMUSCULAR | Status: AC
Start: 2018-02-14 — End: 2018-02-14
  Administered 2018-02-14: 1 g via INTRAMUSCULAR
  Filled 2018-02-14: qty 10

## 2018-02-14 MED ORDER — CEPHALEXIN 500 MG PO CAPS: 500.0000 mg | ORAL_CAPSULE | Freq: Three times a day (TID) | ORAL | 0 refills | Status: DC

## 2018-02-14 MED ORDER — CEPHALEXIN 500 MG PO CAPS: 500.0000 mg | ORAL_CAPSULE | Freq: Four times a day (QID) | ORAL | 0 refills | Status: DC

## 2018-02-14 MED ORDER — LIDOCAINE HCL (PF) 1 % IJ SOLN
INTRAMUSCULAR | Status: AC
  Administered 2018-02-14: 2.1 mL
  Filled 2018-02-14: qty 5

## 2018-02-14 NOTE — ED Notes (Signed)
ED Provider at bedside. 

## 2018-02-14 NOTE — Discharge Instructions (Addendum)
You will be placed on Keflex which it appears you have tolerate before.  Make sure that you are staying hydrated as you appear somewhat dehydrated on exam.  Follow-up with your primary physician.  If you develop fevers or any new or worsening symptoms you should be reevaluated.

## 2018-02-14 NOTE — ED Notes (Signed)
Pt triaged by this RN 

## 2018-02-14 NOTE — ED Provider Notes (Signed)
[facility] EMERGENCY DEPARTMENT Provider Note   CSN: [id number] Arrival date & time: 02/14/18  0221     History   Chief Complaint Chief Complaint  Patient presents with  . Dysuria    HPI Natalie Joseph is a 37 y.o. female.  HPI  This is a 37 year old female with a history of cyclic vomiting syndrome who presents with persistent dysuria.  Patient reports that she was seen and evaluated urgent care on Wednesday.  At that time she was diagnosed for UTI.  She was given Macrobid and Pyridium.  Patient reports that she did not tolerate Macrobid.  She states that she took 2 total doses and it "set off a cyclic vomiting attack."  She reports that she vomited for almost 24 hours.  She discontinued taking antibiotics.  She reports persistent dysuria.  She denies any fevers or back pain.  She states that over the last several days she has not had much to eat or drink but feels that her vomiting is getting better and has been tolerating fluids over the last day.  Patient is unable to get into her primary care physician to change antibiotics.  She was told by urgent care that she was growing out E. coli.  She is requesting changing her antibiotics.  She has not had any nausea or vomiting of the last 24 hours.  Denies abdominal pain.  Past Medical History:  Diagnosis Date  . Cyclic vomiting syndrome   . Myalgia   . Panic attack   . Renal disorder     Patient Active Problem List   Diagnosis Date Noted  . Breast lump 11/17/2017  . Smoker 06/09/2013  . Screen for STD (sexually transmitted disease) 06/09/2013    Past Surgical History:  Procedure Laterality Date  . WISDOM TOOTH EXTRACTION       OB History   None      Home Medications    Prior to Admission medications   Medication Sig Start Date End Date Taking? Authorizing Provider  polyethylene glycol (MIRALAX / GLYCOLAX) packet Take 17 g by mouth daily as needed.   Yes [provider]  ALPRAZolam (XANAX)  0.25 MG tablet Take 0.25 mg by mouth 2 (two) times daily.     Daylene Posey, FNP  Alum & Mag Hydroxide-Simeth (GI COCKTAIL) SUSP suspension Take 30 mLs by mouth 2 (two) times daily as needed for indigestion. Shake well.    [provider]  cephALEXin (KEFLEX) 500 MG capsule Take 1 capsule (500 mg total) by mouth 3 (three) times daily. 02/14/18   Maria Gallicchio, Barbette Hair, MD  cyclobenzaprine (FLEXERIL) 10 MG tablet Take 10 mg by mouth 3 (three) times daily as needed for muscle spasms.    Daylene Posey, FNP  docusate sodium (COLACE) 100 MG capsule Take 1 capsule (100 mg total) by mouth every 12 (twelve) hours. 01/20/15   Julianne Rice, MD  esomeprazole (NEXIUM) 40 MG capsule Take 40 mg by mouth 2 (two) times daily before a meal.    [provider]  HYDROcodone-acetaminophen (NORCO) 10-325 MG tablet Take 1 tablet by mouth every 6 (six) hours as needed.    [provider]  metoCLOPramide (REGLAN) 10 MG tablet Take 10 mg by mouth every 6 (six) hours as needed for nausea.    [provider]  norgestimate-ethinyl estradiol (ORTHO-CYCLEN,SPRINTEC,PREVIFEM) 0.25-35 MG-MCG tablet Take 1 tablet by mouth daily. 06/15/17   Kandis Cocking A, CNM  ondansetron (ZOFRAN ODT) 4 MG disintegrating tablet Take 4 mg  by mouth every 8 (eight) hours as needed for nausea or vomiting.    [provider]  penicillin v potassium (VEETID) 250 MG tablet Take 250 mg by mouth 4 (four) times daily.    [provider]  Potassium Chloride ER 20 MEQ TBCR Take 40 mEq by mouth 2 (two) times daily. 03/15/15   Evelina Bucy, MD  promethazine (PHENERGAN) 25 MG tablet Take 25 mg by mouth every 6 (six) hours as needed for nausea or vomiting.    [provider]  terconazole (TERAZOL 3) 0.8 % vaginal cream Place 1 applicator vaginally at bedtime. Patient not taking: Reported on 01/17/2018 10/23/17   Morene Crocker, CNM    Family History Family History  Problem Relation Age of Onset   . Hypertension Mother   . Hyperlipidemia Mother   . Kidney disease Father   . Hypertension Father   . Depression Father   . Diabetes Father   . Heart disease Father   . Stroke Maternal Grandmother   . Cancer Maternal Grandfather        lung  . Stroke Paternal Grandmother   . Heart disease Paternal Grandfather   . Cancer Paternal Aunt        breast  . Diabetes Paternal Aunt     Social History Social History   Tobacco Use  . Smoking status: Current Every Day Smoker    Packs/day: 2.00    Years: 17.00    Pack years: 34.00    Types: Cigarettes  . Smokeless tobacco: Never Used  Substance Use Topics  . Alcohol use: No  . Drug use: No     Allergies   Septra ds [sulfamethoxazole w/trimethoprim (co-trimoxazole)]; Sulfa antibiotics; Cheese; Corn-containing products; Eggs or egg-derived products; Latex; Milk-related compounds; Peanut-containing drug products; and Shellfish allergy   Review of Systems Review of Systems  Constitutional: Negative for fever.  Respiratory: Negative for shortness of breath.   Cardiovascular: Negative for chest pain.  Gastrointestinal: Positive for nausea and vomiting. Negative for abdominal pain.  Genitourinary: Positive for dysuria. Negative for flank pain.  Neurological: Positive for headaches.  All other systems reviewed and are negative.    Physical Exam Updated Vital Signs BP (!) 129/92 (BP Location: Right Arm)   Pulse (!) 120   Temp 97.8 F (36.6 C) (Oral)   Resp 20   Ht 5\' 4"  (1.626 m)   Wt 68 kg (150 lb)   LMP 02/01/2018 (Exact Date)   SpO2 97%   BMI 25.75 kg/m   Physical Exam  Constitutional: She is oriented to person, place, and time. She appears well-developed and well-nourished. No distress.  HENT:  Head: Normocephalic and atraumatic.  Mucous membranes dry  Eyes: Pupils are equal, round, and reactive to light.  Neck: Neck supple.  Cardiovascular: Regular rhythm and normal heart sounds.  Tachycardia  Pulmonary/Chest:  Effort normal and breath sounds normal. No respiratory distress. She has no wheezes.  Abdominal: Soft. Bowel sounds are normal. There is no tenderness. There is no guarding.  Genitourinary:  Genitourinary Comments: No CVA tenderness  Neurological: She is alert and oriented to person, place, and time.  Skin: Skin is warm and dry.  Psychiatric: She has a normal mood and affect.  Nursing note and vitals reviewed.    ED Treatments / Results  Labs (all labs ordered are listed, but only abnormal results are displayed) Labs Reviewed  URINALYSIS, ROUTINE W REFLEX MICROSCOPIC - Abnormal; Notable for the following components:      Result  Value   Hgb urine dipstick TRACE (*)    Ketones, ur 15 (*)    Leukocytes, UA TRACE (*)    All other components within normal limits  URINALYSIS, MICROSCOPIC (REFLEX) - Abnormal; Notable for the following components:   Bacteria, UA MANY (*)    All other components within normal limits  URINE CULTURE    EKG None  Radiology No results found.  Procedures Procedures (including critical care time)  Medications Ordered in ED Medications  cefTRIAXone (ROCEPHIN) injection 1 g (1 g Intramuscular Given 02/14/18 0304)  lidocaine (PF) (XYLOCAINE) 1 % injection (2.1 mLs  Given 02/14/18 0304)     Initial Impression / Assessment and Plan / ED Course  I have reviewed the triage vital signs and the nursing notes.  Pertinent labs & imaging results that were available during my care of the patient were reviewed by me and considered in my medical decision making (see chart for details).     Patient presents with persistent dysuria.  Requesting change in medications.  I have reviewed her chart.  She grew out pan susceptible E. coli based on chart review.  Repeat urinalysis and urine culture obtained.  Patient is overall nontoxic-appearing.  Her vital signs are notable for initial heart rate of 120.  She clinically appears dehydrated and this would be consistent with  reported vomiting.  I have offered the patient's fluid.  She prefers to orally hydrate.  She states that she has been able to orally hydrate over the last 24 hours.  She is afebrile.  At this time, I have lower suspicion for pyelonephritis.  Urinalysis here with trace leukocytes, 6-10 white cells and many bacteria.  Urine culture is pending.  Patient was given a dose of IM Rocephin.  It appears based on chart review she has previously tolerated Keflex.  Will discharge with Keflex and close primary care follow-up.  After history, exam, and medical workup I feel the patient has been appropriately medically screened and is safe for discharge home. Pertinent diagnoses were discussed with the patient. Patient was given return precautions.   Final Clinical Impressions(s) / ED Diagnoses   Final diagnoses:  Acute cystitis without hematuria    ED Discharge Orders        Ordered    cephALEXin (KEFLEX) 500 MG capsule  4 times daily,   Status:  Discontinued     02/14/18 0340    cephALEXin (KEFLEX) 500 MG capsule  3 times daily     02/14/18 0340       Malaak Stach, Barbette Hair, MD 02/14/18 [phone]

## 2018-02-14 NOTE — ED Triage Notes (Signed)
Pt states she was prescribed meds for UTI  Thuirsday and began vomiting shortly after taking second dose of Macrobid. Hx cyclic vomiting. Med triggered H/A and episode of vomiting. Got phonecall regarding ecoli in urine and was told to continue meds, but pt did not want to do so. Present to ED for some residual H/A and wants treatment for ecoli. Still having some burning with urination.

## 2018-02-15 LAB — URINE CULTURE: Culture: <10000 — AB

## 2018-04-12 ENCOUNTER — Ambulatory Visit: Payer: Self-pay | Admitting: Physician Assistant

## 2018-05-02 ENCOUNTER — Ambulatory Visit: Payer: Medicaid Other | Admitting: Obstetrics and Gynecology

## 2018-05-02 ENCOUNTER — Other Ambulatory Visit (HOSPITAL_COMMUNITY)
Admission: RE | Admit: 2018-05-02 | Discharge: 2018-05-02 | Disposition: A | Discharge status: Home / Self Care | Payer: Medicaid Other | Source: Ambulatory Visit | Attending: Obstetrics and Gynecology | Admitting: Obstetrics and Gynecology

## 2018-05-02 ENCOUNTER — Encounter: Payer: Self-pay | Admitting: Obstetrics and Gynecology

## 2018-05-02 VITALS — BP 132/79 | HR 103 | Wt 156.0 lb

## 2018-05-02 DIAGNOSIS — Z113 Encounter for screening for infections with a predominantly sexual mode of transmission: Secondary | ICD-10-CM | POA: Insufficient documentation

## 2018-05-02 DIAGNOSIS — R300 Dysuria: Secondary | ICD-10-CM | POA: Diagnosis not present
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
## 2018-05-03 LAB — CERVICOVAGINAL ANCILLARY ONLY
Bacterial vaginitis: NEGATIVE
Candida vaginitis: NEGATIVE
Chlamydia: NEGATIVE
Neisseria Gonorrhea: NEGATIVE
Trichomonas: NEGATIVE

## 2018-05-03 LAB — HEPATITIS C ANTIBODY: Hep C Virus Ab: <0.1 s/co ratio (ref 0.0–0.9)

## 2018-05-03 LAB — HIV ANTIBODY (ROUTINE TESTING W REFLEX): HIV Screen 4th Generation wRfx: NONREACTIVE

## 2018-05-03 LAB — HEPATITIS B SURFACE ANTIGEN: Hepatitis B Surface Ag: NEGATIVE

## 2018-05-03 LAB — RPR: RPR Ser Ql: NONREACTIVE

## 2018-05-04 LAB — URINE CULTURE

## 2018-05-04 LAB — HSV 2 ANTIBODY, IGG: HSV 2 IgG, Type Spec: <0.91 index (ref 0.00–0.90)

## 2018-08-15 ENCOUNTER — Emergency Department (HOSPITAL_BASED_OUTPATIENT_CLINIC_OR_DEPARTMENT_OTHER)
Admission: EM | Admit: 2018-08-15 | Discharge: 2018-08-15 | Disposition: A | Discharge status: Home / Self Care | Payer: Medicaid Other | Attending: Emergency Medicine | Admitting: Emergency Medicine

## 2018-08-15 ENCOUNTER — Emergency Department (HOSPITAL_BASED_OUTPATIENT_CLINIC_OR_DEPARTMENT_OTHER): Payer: Medicaid Other

## 2018-08-15 ENCOUNTER — Other Ambulatory Visit: Payer: Self-pay

## 2018-08-15 ENCOUNTER — Encounter (HOSPITAL_BASED_OUTPATIENT_CLINIC_OR_DEPARTMENT_OTHER): Payer: Self-pay

## 2018-08-15 DIAGNOSIS — R0789 Other chest pain: Secondary | ICD-10-CM | POA: Diagnosis not present

## 2018-08-15 DIAGNOSIS — Z0471 Encounter for examination and observation following alleged adult physical abuse: Secondary | ICD-10-CM | POA: Diagnosis present

## 2018-08-15 DIAGNOSIS — Z79899 Other long term (current) drug therapy: Secondary | ICD-10-CM | POA: Insufficient documentation

## 2018-08-15 DIAGNOSIS — F17210 Nicotine dependence, cigarettes, uncomplicated: Secondary | ICD-10-CM | POA: Diagnosis not present

## 2018-08-15 DIAGNOSIS — M25532 Pain in left wrist: Secondary | ICD-10-CM | POA: Insufficient documentation

## 2018-08-15 MED ORDER — KETOROLAC TROMETHAMINE 30 MG/ML IJ SOLN
30.0000 mg | Freq: Once | INTRAMUSCULAR | Status: AC
  Administered 2018-08-15: 30 mg via INTRAMUSCULAR
  Filled 2018-08-15: qty 1

## 2018-08-15 MED ORDER — NAPROXEN 500 MG PO TABS: 500.0000 mg | ORAL_TABLET | Freq: Two times a day (BID) | ORAL | 0 refills | Status: DC
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
## 2018-08-15 NOTE — ED Notes (Signed)
Pt understood dc material. NAD noted. Script given at dc  

## 2018-10-12 ENCOUNTER — Other Ambulatory Visit: Payer: Self-pay

## 2018-10-12 ENCOUNTER — Encounter (HOSPITAL_BASED_OUTPATIENT_CLINIC_OR_DEPARTMENT_OTHER): Payer: Self-pay

## 2018-10-12 ENCOUNTER — Emergency Department (HOSPITAL_BASED_OUTPATIENT_CLINIC_OR_DEPARTMENT_OTHER)
Admission: EM | Admit: 2018-10-12 | Discharge: 2018-10-13 | Disposition: A | Discharge status: Home / Self Care | Payer: Medicaid Other | Attending: Emergency Medicine | Admitting: Emergency Medicine

## 2018-10-12 DIAGNOSIS — Z91040 Latex allergy status: Secondary | ICD-10-CM | POA: Diagnosis not present

## 2018-10-12 DIAGNOSIS — F17210 Nicotine dependence, cigarettes, uncomplicated: Secondary | ICD-10-CM | POA: Diagnosis not present

## 2018-10-12 DIAGNOSIS — M5030 Other cervical disc degeneration, unspecified cervical region: Secondary | ICD-10-CM | POA: Insufficient documentation

## 2018-10-12 DIAGNOSIS — Z91010 Allergy to peanuts: Secondary | ICD-10-CM | POA: Diagnosis not present

## 2018-10-12 DIAGNOSIS — Z79899 Other long term (current) drug therapy: Secondary | ICD-10-CM | POA: Insufficient documentation

## 2018-10-12 DIAGNOSIS — M546 Pain in thoracic spine: Secondary | ICD-10-CM | POA: Diagnosis present

## 2018-10-12 HISTORY — DX: Gastritis, unspecified, without bleeding: K29.70
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
## 2018-10-13 ENCOUNTER — Emergency Department (HOSPITAL_BASED_OUTPATIENT_CLINIC_OR_DEPARTMENT_OTHER): Payer: Medicaid Other

## 2018-10-13 ENCOUNTER — Encounter (HOSPITAL_BASED_OUTPATIENT_CLINIC_OR_DEPARTMENT_OTHER): Payer: Self-pay | Admitting: Emergency Medicine

## 2018-10-13 MED ORDER — ACETAMINOPHEN 500 MG PO TABS
1000.0000 mg | ORAL_TABLET | Freq: Once | ORAL | Status: AC
  Administered 2018-10-13: 1000 mg via ORAL
  Filled 2018-10-13: qty 2

## 2018-10-13 NOTE — ED Provider Notes (Signed)
438 am Patient called back [facility] there there was standardized information about opiates on her discharge paperwork.  Lattie Haw charge nurse is currently speaking with patient via phone.  She is stating she needs to take the paperwork to her attorney.  Patient did not give a urine specimen.  See Lisa's note for more information   Lynn Sissel, MD 10/13/18 [id number]

## 2018-10-13 NOTE — ED Notes (Signed)
PT states understanding of care given, follow up care. PT ambulated from ED to car with a steady gait.  
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
## 2019-02-27 ENCOUNTER — Emergency Department (HOSPITAL_BASED_OUTPATIENT_CLINIC_OR_DEPARTMENT_OTHER)
Admission: EM | Admit: 2019-02-27 | Discharge: 2019-02-27 | Disposition: A | Discharge status: Home / Self Care | Payer: Medicaid Other | Attending: Emergency Medicine | Admitting: Emergency Medicine

## 2019-02-27 ENCOUNTER — Encounter (HOSPITAL_BASED_OUTPATIENT_CLINIC_OR_DEPARTMENT_OTHER): Payer: Self-pay | Admitting: Emergency Medicine

## 2019-02-27 ENCOUNTER — Emergency Department (HOSPITAL_BASED_OUTPATIENT_CLINIC_OR_DEPARTMENT_OTHER): Payer: Medicaid Other

## 2019-02-27 ENCOUNTER — Other Ambulatory Visit: Payer: Self-pay

## 2019-02-27 DIAGNOSIS — Z79899 Other long term (current) drug therapy: Secondary | ICD-10-CM | POA: Insufficient documentation

## 2019-02-27 DIAGNOSIS — Y939 Activity, unspecified: Secondary | ICD-10-CM | POA: Insufficient documentation

## 2019-02-27 DIAGNOSIS — Z8669 Personal history of other diseases of the nervous system and sense organs: Secondary | ICD-10-CM | POA: Insufficient documentation

## 2019-02-27 DIAGNOSIS — Y999 Unspecified external cause status: Secondary | ICD-10-CM | POA: Insufficient documentation

## 2019-02-27 DIAGNOSIS — R4182 Altered mental status, unspecified: Secondary | ICD-10-CM | POA: Insufficient documentation

## 2019-02-27 DIAGNOSIS — T148XXA Other injury of unspecified body region, initial encounter: Secondary | ICD-10-CM

## 2019-02-27 DIAGNOSIS — X58XXXA Exposure to other specified factors, initial encounter: Secondary | ICD-10-CM | POA: Insufficient documentation

## 2019-02-27 DIAGNOSIS — S0001XA Abrasion of scalp, initial encounter: Secondary | ICD-10-CM | POA: Insufficient documentation

## 2019-02-27 DIAGNOSIS — Z91040 Latex allergy status: Secondary | ICD-10-CM | POA: Insufficient documentation

## 2019-02-27 DIAGNOSIS — F17210 Nicotine dependence, cigarettes, uncomplicated: Secondary | ICD-10-CM | POA: Diagnosis not present

## 2019-02-27 DIAGNOSIS — Y929 Unspecified place or not applicable: Secondary | ICD-10-CM | POA: Insufficient documentation

## 2019-02-27 DIAGNOSIS — S0990XA Unspecified injury of head, initial encounter: Secondary | ICD-10-CM | POA: Diagnosis present

## 2019-02-27 DIAGNOSIS — Z91010 Allergy to peanuts: Secondary | ICD-10-CM | POA: Insufficient documentation

## 2019-02-27 LAB — PREGNANCY, URINE: Preg Test, Ur: NEGATIVE

## 2019-02-27 MED ORDER — NAPROXEN 500 MG PO TABS: 500.0000 mg | ORAL_TABLET | Freq: Two times a day (BID) | ORAL | 0 refills | Status: DC

## 2019-02-27 MED ORDER — METOCLOPRAMIDE HCL 10 MG PO TABS
10.0000 mg | ORAL_TABLET | Freq: Once | ORAL | Status: AC
  Administered 2019-02-27: 10 mg via ORAL
  Filled 2019-02-27: qty 1

## 2019-02-27 MED ORDER — NAPROXEN 250 MG PO TABS
500.0000 mg | ORAL_TABLET | Freq: Once | ORAL | Status: AC
  Administered 2019-02-27: 500 mg via ORAL
  Filled 2019-02-27: qty 2
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
## 2019-05-08 ENCOUNTER — Ambulatory Visit: Payer: Medicaid Other | Admitting: Advanced Practice Midwife

## 2019-05-22 ENCOUNTER — Ambulatory Visit: Payer: Medicaid Other | Admitting: Obstetrics and Gynecology

## 2019-05-26 ENCOUNTER — Encounter (HOSPITAL_BASED_OUTPATIENT_CLINIC_OR_DEPARTMENT_OTHER): Payer: Self-pay | Admitting: Emergency Medicine

## 2019-05-26 ENCOUNTER — Emergency Department (HOSPITAL_BASED_OUTPATIENT_CLINIC_OR_DEPARTMENT_OTHER)
Admission: EM | Admit: 2019-05-26 | Discharge: 2019-05-26 | Disposition: A | Discharge status: Home / Self Care | Payer: Medicaid Other | Attending: Emergency Medicine | Admitting: Emergency Medicine

## 2019-05-26 DIAGNOSIS — Z79899 Other long term (current) drug therapy: Secondary | ICD-10-CM | POA: Diagnosis not present

## 2019-05-26 DIAGNOSIS — Z91040 Latex allergy status: Secondary | ICD-10-CM | POA: Diagnosis not present

## 2019-05-26 DIAGNOSIS — F17210 Nicotine dependence, cigarettes, uncomplicated: Secondary | ICD-10-CM | POA: Insufficient documentation

## 2019-05-26 DIAGNOSIS — E876 Hypokalemia: Secondary | ICD-10-CM | POA: Insufficient documentation

## 2019-05-26 DIAGNOSIS — R1115 Cyclical vomiting syndrome unrelated to migraine: Secondary | ICD-10-CM | POA: Diagnosis not present

## 2019-05-26 DIAGNOSIS — E860 Dehydration: Secondary | ICD-10-CM | POA: Diagnosis not present

## 2019-05-26 DIAGNOSIS — R1110 Vomiting, unspecified: Secondary | ICD-10-CM | POA: Diagnosis present

## 2019-05-26 LAB — CBC WITH DIFFERENTIAL/PLATELET
Abs Immature Granulocytes: 0.09 10*3/uL — ABNORMAL HIGH (ref 0.00–0.07)
Basophils Absolute: 0.1 10*3/uL (ref 0.0–0.1)
Basophils Relative: 0 %
Eosinophils Absolute: 0.1 10*3/uL (ref 0.0–0.5)
Eosinophils Relative: 0 %
HCT: 51 % — ABNORMAL HIGH (ref 36.0–46.0)
Hemoglobin: 17.5 g/dL — ABNORMAL HIGH (ref 12.0–15.0)
Immature Granulocytes: 0 %
Lymphocytes Relative: 11 %
Lymphs Abs: 2.2 10*3/uL (ref 0.7–4.0)
MCH: 31.4 pg (ref 26.0–34.0)
MCHC: 34.3 g/dL (ref 30.0–36.0)
MCV: 91.4 fL (ref 80.0–100.0)
Monocytes Absolute: 1.3 10*3/uL — ABNORMAL HIGH (ref 0.1–1.0)
Monocytes Relative: 6 %
Neutro Abs: 17.5 10*3/uL — ABNORMAL HIGH (ref 1.7–7.7)
Neutrophils Relative %: 83 %
Platelets: 477 10*3/uL — ABNORMAL HIGH (ref 150–400)
RBC: 5.58 MIL/uL — ABNORMAL HIGH (ref 3.87–5.11)
RDW: 13.2 % (ref 11.5–15.5)
WBC: 21.2 10*3/uL — ABNORMAL HIGH (ref 4.0–10.5)
nRBC: 0 % (ref 0.0–0.2)

## 2019-05-26 LAB — BASIC METABOLIC PANEL
Anion gap: 21 — ABNORMAL HIGH (ref 5–15)
BUN: 19 mg/dL (ref 6–20)
CO2: 21 mmol/L — ABNORMAL LOW (ref 22–32)
Calcium: 10.6 mg/dL — ABNORMAL HIGH (ref 8.9–10.3)
Chloride: 89 mmol/L — ABNORMAL LOW (ref 98–111)
Creatinine, Ser: 1.35 mg/dL — ABNORMAL HIGH (ref 0.44–1.00)
GFR calc Af Amer: 58 mL/min — ABNORMAL LOW (ref >60)
GFR calc non Af Amer: 50 mL/min — ABNORMAL LOW (ref >60)
Glucose, Bld: 142 mg/dL — ABNORMAL HIGH (ref 70–99)
Potassium: 3 mmol/L — ABNORMAL LOW (ref 3.5–5.1)
Sodium: 131 mmol/L — ABNORMAL LOW (ref 135–145)

## 2019-05-26 MED ORDER — SODIUM CHLORIDE 0.9 % IV BOLUS
2000.0000 mL | Freq: Once | INTRAVENOUS | Status: AC
  Administered 2019-05-26: 2000 mL via INTRAVENOUS

## 2019-05-26 MED ORDER — POTASSIUM CHLORIDE 20 MEQ PO PACK: 20.0000 meq | PACK | Freq: Two times a day (BID) | ORAL | 0 refills | Status: DC

## 2019-05-26 MED ORDER — ONDANSETRON HCL 4 MG/2ML IJ SOLN
4.0000 mg | Freq: Once | INTRAMUSCULAR | Status: AC
  Administered 2019-05-26: 4 mg via INTRAVENOUS
  Filled 2019-05-26: qty 2

## 2019-05-26 MED ORDER — POTASSIUM CHLORIDE 20 MEQ/15ML (10%) PO SOLN
40.0000 meq | Freq: Once | ORAL | Status: AC
  Administered 2019-05-26: 40 meq via ORAL
  Filled 2019-05-26: qty 30

## 2019-05-26 MED ORDER — METOCLOPRAMIDE HCL 5 MG/ML IJ SOLN
10.0000 mg | Freq: Once | INTRAMUSCULAR | Status: AC
  Administered 2019-05-26: 10 mg via INTRAVENOUS
  Filled 2019-05-26: qty 2

## 2019-05-26 NOTE — ED Provider Notes (Signed)
[facility] Provider Note: [name], MD, FACEP  CSN: [id number] MRN: [id number] ARRIVAL: 05/26/19 at [place]: [place]  Vomiting   HISTORY OF PRESENT ILLNESS  05/26/19 1:21 AM Natalie Joseph is a 38 y.o. female with a history of cyclic vomiting syndrome.  She had an upper endoscopy 4 days ago which found gastritis and she had 2 gastric biopsies taken.  She believes this triggered an episode of cyclic vomiting 2 days later.  She has vomited persistently until yesterday evening about 11 PM.  She feels dehydrated and she is having generalized body aches which she believes are due to hypokalemia.  She rates her pain as a 9 out of 10, generalized and crampy in nature.  She denies diarrhea.  She normally takes potassium supplements but is out.   Past Medical History:  Diagnosis Date  . Cyclic vomiting syndrome   . Gastritis   . Myalgia   . Panic attack   . Renal disorder     Past Surgical History:  Procedure Laterality Date  . ESOPHAGOGASTRODUODENOSCOPY    . HEMORRHOID SURGERY    . WISDOM TOOTH EXTRACTION      Family History  Problem Relation Age of Onset  . Hypertension Mother   . Hyperlipidemia Mother   . Kidney disease Father   . Hypertension Father   . Depression Father   . Diabetes Father   . Heart disease Father   . Stroke Maternal Grandmother   . Cancer Maternal Grandfather        lung  . Stroke Paternal Grandmother   . Heart disease Paternal Grandfather   . Cancer Paternal Aunt        breast  . Diabetes Paternal Aunt     Social History   Tobacco Use  . Smoking status: Current Every Day Smoker    Packs/day: 1.50    Years: 17.00    Pack years: 25.50    Types: Cigarettes  . Smokeless tobacco: Never Used  Substance Use Topics  . Alcohol use: No  . Drug use: No    Prior to Admission medications   Medication Sig Start Date End Date Taking? Authorizing Provider  ALPRAZolam (XANAX) 0.25 MG tablet Take  0.25 mg by mouth 2 (two) times daily.     Daylene Posey, FNP  Alum & Mag Hydroxide-Simeth (GI COCKTAIL) SUSP suspension Take 30 mLs by mouth 2 (two) times daily as needed for indigestion. Shake well.    [provider]  cephALEXin (KEFLEX) 500 MG capsule Take 1 capsule (500 mg total) by mouth 3 (three) times daily. 02/14/18   Horton, Barbette Hair, MD  cyclobenzaprine (FLEXERIL) 10 MG tablet Take 10 mg by mouth 3 (three) times daily as needed for muscle spasms.    Daylene Posey, FNP  docusate sodium (COLACE) 100 MG capsule Take 1 capsule (100 mg total) by mouth every 12 (twelve) hours. 01/20/15   Julianne Rice, MD  esomeprazole (NEXIUM) 40 MG capsule Take 40 mg by mouth 2 (two) times daily before a meal.    [provider]  HYDROcodone-acetaminophen (NORCO) 10-325 MG tablet Take 1 tablet by mouth every 6 (six) hours as needed.    [provider]  metoCLOPramide (REGLAN) 10 MG tablet Take 10 mg by mouth every 6 (six) hours as needed for nausea.    [provider]  naproxen (NAPROSYN) 500 MG tablet Take 1 tablet (500 mg total) by mouth 2 (two) times daily. 08/15/18  Horton, Barbette Hair, MD  naproxen (NAPROSYN) 500 MG tablet Take 1 tablet (500 mg total) by mouth 2 (two) times daily with a meal. 02/27/19   Palumbo, April, MD  norgestimate-ethinyl estradiol (ORTHO-CYCLEN,SPRINTEC,PREVIFEM) 0.25-35 MG-MCG tablet Take 1 tablet by mouth daily. 06/15/17   Kandis Cocking A, CNM  ondansetron (ZOFRAN ODT) 4 MG disintegrating tablet Take 4 mg by mouth every 8 (eight) hours as needed for nausea or vomiting.    [provider]  penicillin v potassium (VEETID) 250 MG tablet Take 250 mg by mouth 4 (four) times daily.    [provider]  polyethylene glycol (MIRALAX / GLYCOLAX) packet Take 17 g by mouth daily as needed.    [provider]  Potassium Chloride ER 20 MEQ TBCR Take 40 mEq by mouth 2 (two) times daily. 03/15/15   Evelina Bucy, MD  promethazine  (PHENERGAN) 25 MG tablet Take 25 mg by mouth every 6 (six) hours as needed for nausea or vomiting.    [provider]  propranolol ER (INDERAL LA) 80 MG 24 hr capsule Take 80 mg by mouth daily. 05/18/18   [provider]  terconazole (TERAZOL 3) 0.8 % vaginal cream Place 1 applicator vaginally at bedtime. Patient not taking: Reported on 01/17/2018 10/23/17   Kandis Cocking A, CNM  zonisamide (ZONEGRAN) 100 MG capsule 100 mg 4 (four) times daily as needed. 01/18/19   [provider]    Allergies Septra ds [sulfamethoxazole w/trimethoprim (co-trimoxazole)], Sulfa antibiotics, Cheese, Corn-containing products, Eggs or egg-derived products, Latex, Milk-related compounds, Peanut-containing drug products, and Shellfish allergy   REVIEW OF SYSTEMS  Negative except as noted here or in the History of Present Illness.   PHYSICAL EXAMINATION  Initial Vital Signs Blood pressure (!) 149/115, pulse (!) 133, temperature 98.1 F (36.7 C), temperature source Oral, resp. rate 20, height 5\' 4"  (1.626 m), weight 68 kg, last menstrual period 05/09/2019, SpO2 98 %.  Examination General: Well-developed, well-nourished female in no acute distress; appearance consistent with age of record HENT: normocephalic; atraumatic Eyes: pupils equal, round and reactive to light; extraocular muscles intact Neck: supple Heart: regular rate and rhythm; tachycardia Lungs: clear to auscultation bilaterally Abdomen: soft; nondistended; mild diffuse tenderness; bowel sounds present Extremities: No deformity; full range of motion; pulses normal Neurologic: Awake, alert and oriented; motor function intact in all extremities and symmetric; no facial droop Skin: Clammy Psychiatric: Normal mood and affect   RESULTS  Summary of this visit's results, reviewed by myself:   EKG Interpretation  Date/Time:  Friday May 26 2019 01:34:15 EDT Ventricular Rate:  108 PR Interval:    QRS Duration: 71 QT  Interval:  330 QTC Calculation: 443 R Axis:   62 Text Interpretation:  Sinus tachycardia Biatrial enlargement Rate is faster Confirmed by Janella Rogala, Jenny Reichmann [phone]) on 05/26/2019 1:36:35 AM      Laboratory Studies: Results for orders placed or performed during the hospital encounter of 05/26/19 (from the past 24 hour(s))  CBC with Differential/Platelet     Status: Abnormal   Collection Time: 05/26/19  1:20 AM  Result Value Ref Range   WBC 21.2 (H) 4.0 - 10.5 K/uL   RBC 5.58 (H) 3.87 - 5.11 MIL/uL   Hemoglobin 17.5 (H) 12.0 - 15.0 g/dL   HCT 51.0 (H) 36.0 - 46.0 %   MCV 91.4 80.0 - 100.0 fL   MCH 31.4 26.0 - 34.0 pg   MCHC 34.3 30.0 - 36.0 g/dL   RDW 13.2 11.5 - 15.5 %   Platelets  477 (H) 150 - 400 K/uL   nRBC 0.0 0.0 - 0.2 %   Neutrophils Relative % 83 %   Neutro Abs 17.5 (H) 1.7 - 7.7 K/uL   Lymphocytes Relative 11 %   Lymphs Abs 2.2 0.7 - 4.0 K/uL   Monocytes Relative 6 %   Monocytes Absolute 1.3 (H) 0.1 - 1.0 K/uL   Eosinophils Relative 0 %   Eosinophils Absolute 0.1 0.0 - 0.5 K/uL   Basophils Relative 0 %   Basophils Absolute 0.1 0.0 - 0.1 K/uL   Immature Granulocytes 0 %   Abs Immature Granulocytes 0.09 (H) 0.00 - 0.07 K/uL  Basic metabolic panel     Status: Abnormal   Collection Time: 05/26/19  1:20 AM  Result Value Ref Range   Sodium 131 (L) 135 - 145 mmol/L   Potassium 3.0 (L) 3.5 - 5.1 mmol/L   Chloride 89 (L) 98 - 111 mmol/L   CO2 21 (L) 22 - 32 mmol/L   Glucose, Bld 142 (H) 70 - 99 mg/dL   BUN 19 6 - 20 mg/dL   Creatinine, Ser 1.35 (H) 0.44 - 1.00 mg/dL   Calcium 10.6 (H) 8.9 - 10.3 mg/dL   GFR calc non Af Amer 50 (L) >60 mL/min   GFR calc Af Amer 58 (L) >60 mL/min   Anion gap 21 (H) 5 - 15   Imaging Studies: No results found.  ED COURSE and MDM  Nursing notes and initial vitals signs, including pulse oximetry, reviewed.  Vitals:   05/26/19 0113 05/26/19 0114  BP: (!) 149/115   Pulse: (!) 133   Resp: 20   Temp: 98.1 F (36.7 C)   TempSrc: Oral    SpO2: 98%   Weight:  68 kg  Height:  5\' 4"  (1.626 m)   2:44 AM Patient feeling better after IV fluids and wishes to go home at this time.  She was advised to follow-up with her primary care physician but in the meantime we will refill her potassium supplement.  PROCEDURES    ED DIAGNOSES     ICD-10-CM   1. Cyclic vomiting syndrome  R11.15   2. Hypokalemia  E87.6   3. Dehydration  E86.0        Eulanda Dorion, Jenny Reichmann, MD 05/26/19 [phone]

## 2019-05-26 NOTE — ED Triage Notes (Signed)
Pt states she has cyclic vomiting and she started vomiting on Wednesday and it stopped tonight at 11pm   Pt states tonight it had coffee ground emesis  Pt states she had an endoscopy on Monday  States she feels weak,dehydrated, and her potassium is low

## 2019-06-01 ENCOUNTER — Ambulatory Visit: Payer: Medicaid Other | Admitting: Internal Medicine

## 2019-06-21 ENCOUNTER — Emergency Department (HOSPITAL_BASED_OUTPATIENT_CLINIC_OR_DEPARTMENT_OTHER)
Admission: EM | Admit: 2019-06-21 | Discharge: 2019-06-21 | Disposition: A | Discharge status: Home / Self Care | Payer: Medicaid Other | Attending: Emergency Medicine | Admitting: Emergency Medicine

## 2019-06-21 ENCOUNTER — Encounter (HOSPITAL_BASED_OUTPATIENT_CLINIC_OR_DEPARTMENT_OTHER): Payer: Self-pay

## 2019-06-21 ENCOUNTER — Other Ambulatory Visit: Payer: Self-pay

## 2019-06-21 DIAGNOSIS — R531 Weakness: Secondary | ICD-10-CM | POA: Diagnosis present

## 2019-06-21 DIAGNOSIS — F17210 Nicotine dependence, cigarettes, uncomplicated: Secondary | ICD-10-CM | POA: Diagnosis not present

## 2019-06-21 DIAGNOSIS — Z91040 Latex allergy status: Secondary | ICD-10-CM | POA: Insufficient documentation

## 2019-06-21 DIAGNOSIS — E860 Dehydration: Secondary | ICD-10-CM | POA: Insufficient documentation

## 2019-06-21 DIAGNOSIS — R1115 Cyclical vomiting syndrome unrelated to migraine: Secondary | ICD-10-CM | POA: Insufficient documentation

## 2019-06-21 DIAGNOSIS — Z79899 Other long term (current) drug therapy: Secondary | ICD-10-CM | POA: Insufficient documentation

## 2019-06-21 LAB — URINALYSIS, ROUTINE W REFLEX MICROSCOPIC
Glucose, UA: NEGATIVE mg/dL
Ketones, ur: 40 mg/dL — AB
Leukocytes,Ua: NEGATIVE
Nitrite: NEGATIVE
Protein, ur: 30 mg/dL — AB
Specific Gravity, Urine: 1.02 (ref 1.005–1.030)
pH: 5.5 (ref 5.0–8.0)

## 2019-06-21 LAB — URINALYSIS, MICROSCOPIC (REFLEX): WBC, UA: NONE SEEN WBC/hpf (ref 0–5)

## 2019-06-21 LAB — COMPREHENSIVE METABOLIC PANEL
ALT: 17 U/L (ref 0–44)
AST: 23 U/L (ref 15–41)
Albumin: 5.5 g/dL — ABNORMAL HIGH (ref 3.5–5.0)
Alkaline Phosphatase: 113 U/L (ref 38–126)
Anion gap: 20 — ABNORMAL HIGH (ref 5–15)
BUN: 38 mg/dL — ABNORMAL HIGH (ref 6–20)
CO2: 36 mmol/L — ABNORMAL HIGH (ref 22–32)
Calcium: 10.4 mg/dL — ABNORMAL HIGH (ref 8.9–10.3)
Chloride: 68 mmol/L — ABNORMAL LOW (ref 98–111)
Creatinine, Ser: 1.17 mg/dL — ABNORMAL HIGH (ref 0.44–1.00)
GFR calc Af Amer: >60 mL/min (ref >60)
GFR calc non Af Amer: 59 mL/min — ABNORMAL LOW (ref >60)
Glucose, Bld: 104 mg/dL — ABNORMAL HIGH (ref 70–99)
Potassium: 3.4 mmol/L — ABNORMAL LOW (ref 3.5–5.1)
Sodium: 124 mmol/L — ABNORMAL LOW (ref 135–145)
Total Bilirubin: 2.1 mg/dL — ABNORMAL HIGH (ref 0.3–1.2)
Total Protein: 9.2 g/dL — ABNORMAL HIGH (ref 6.5–8.1)

## 2019-06-21 LAB — CBC
HCT: 50.1 % — ABNORMAL HIGH (ref 36.0–46.0)
Hemoglobin: 17.7 g/dL — ABNORMAL HIGH (ref 12.0–15.0)
MCH: 31.6 pg (ref 26.0–34.0)
MCHC: 35.3 g/dL (ref 30.0–36.0)
MCV: 89.3 fL (ref 80.0–100.0)
Platelets: 401 10*3/uL — ABNORMAL HIGH (ref 150–400)
RBC: 5.61 MIL/uL — ABNORMAL HIGH (ref 3.87–5.11)
RDW: 13.1 % (ref 11.5–15.5)
WBC: 24.3 10*3/uL — ABNORMAL HIGH (ref 4.0–10.5)
nRBC: 0 % (ref 0.0–0.2)

## 2019-06-21 LAB — LACTIC ACID, PLASMA: Lactic Acid, Venous: 2.6 mmol/L (ref 0.5–1.9)

## 2019-06-21 LAB — MAGNESIUM: Magnesium: 2.9 mg/dL — ABNORMAL HIGH (ref 1.7–2.4)

## 2019-06-21 LAB — LIPASE, BLOOD: Lipase: 44 U/L (ref 11–51)

## 2019-06-21 MED ORDER — LACTATED RINGERS IV BOLUS
1000.0000 mL | Freq: Once | INTRAVENOUS | Status: AC
  Administered 2019-06-21: 1000 mL via INTRAVENOUS

## 2019-06-21 MED ORDER — SODIUM CHLORIDE 0.9% FLUSH
3.0000 mL | Freq: Once | INTRAVENOUS | Status: AC
  Administered 2019-06-21: 21:00:00 3 mL via INTRAVENOUS
  Filled 2019-06-21: qty 3

## 2019-06-21 NOTE — ED Notes (Signed)
Pt's daughter states that pt is acting strange, that pt keeps talking to herself or to people who are not there. Per daughter this is not her normal and it has never happened before.

## 2019-06-21 NOTE — Discharge Instructions (Signed)
Follow up with your family doc in the office.  Try to push fluids iv fluids.

## 2019-06-21 NOTE — ED Triage Notes (Signed)
Pt c/o "cyclic vomiting event started last Thursday"-NAD-steady gait

## 2019-06-21 NOTE — ED Provider Notes (Signed)
[name] EMERGENCY DEPARTMENT Provider Note   CSN: [id number] Arrival date & time: 06/21/19  1935     History   Chief Complaint Chief Complaint  Patient presents with   Vomiting    HPI Natalie Joseph is a 38 y.o. female.     38 yo F with a cc of weakness.  Occurred after her cyclical vomiting episode over the weekend.  She has been able to eat and drink since then.  No abdominal tenderness.  She is concerned because she feels very fatigued.  The history is provided by the patient.  Illness Severity:  Moderate Onset quality:  Gradual Duration:  2 days Timing:  Constant Progression:  Unchanged Chronicity:  New Associated symptoms: no chest pain, no congestion, no fever, no headaches, no myalgias, no nausea, no rhinorrhea, no shortness of breath, no vomiting and no wheezing     Past Medical History:  Diagnosis Date   Cyclic vomiting syndrome    Gastritis    Myalgia    Panic attack    Renal disorder     Patient Active Problem List   Diagnosis Date Noted   Breast lump 11/17/2017   Smoker 06/09/2013   Screen for STD (sexually transmitted disease) 06/09/2013    Past Surgical History:  Procedure Laterality Date   ESOPHAGOGASTRODUODENOSCOPY     HEMORRHOID SURGERY     WISDOM TOOTH EXTRACTION       OB History   No obstetric history on file.      Home Medications    Prior to Admission medications   Medication Sig Start Date End Date Taking? Authorizing Provider  potassium chloride (KLOR-CON) 20 MEQ packet Take 20 mEq by mouth 2 (two) times daily. 05/26/19   Molpus, John, MD  propranolol ER (INDERAL LA) 80 MG 24 hr capsule Take 80 mg by mouth daily. 05/18/18   [provider]  zonisamide (ZONEGRAN) 100 MG capsule 100 mg 4 (four) times daily as needed. 01/18/19   [provider]  esomeprazole (NEXIUM) 40 MG capsule Take 40 mg by mouth 2 (two) times daily before a meal.  05/26/19  [provider]    norgestimate-ethinyl estradiol (ORTHO-CYCLEN,SPRINTEC,PREVIFEM) 0.25-35 MG-MCG tablet Take 1 tablet by mouth daily. 06/15/17 05/26/19  Morene Crocker, CNM    Family History Family History  Problem Relation Age of Onset   Hypertension Mother    Hyperlipidemia Mother    Kidney disease Father    Hypertension Father    Depression Father    Diabetes Father    Heart disease Father    Stroke Maternal Grandmother    Cancer Maternal Grandfather        lung   Stroke Paternal Grandmother    Heart disease Paternal Grandfather    Cancer Paternal Aunt        breast   Diabetes Paternal Aunt     Social History Social History   Tobacco Use   Smoking status: Current Every Day Smoker    Packs/day: 1.50    Years: 17.00    Pack years: 25.50    Types: Cigarettes   Smokeless tobacco: Never Used  Substance Use Topics   Alcohol use: No   Drug use: No     Allergies   Septra ds [sulfamethoxazole w/trimethoprim (co-trimoxazole)], Sulfa antibiotics, Cheese, Corn-containing products, Eggs or egg-derived products, Latex, Milk-related compounds, Peanut-containing drug products, and Shellfish allergy   Review of Systems Review of Systems  Constitutional: Negative for chills and fever.  HENT: Negative for  congestion and rhinorrhea.   Eyes: Negative for redness and visual disturbance.  Respiratory: Negative for shortness of breath and wheezing.   Cardiovascular: Negative for chest pain and palpitations.  Gastrointestinal: Negative for nausea and vomiting.  Genitourinary: Negative for dysuria and urgency.  Musculoskeletal: Negative for arthralgias and myalgias.  Skin: Negative for pallor and wound.  Neurological: Positive for weakness. Negative for dizziness and headaches.     Physical Exam Updated Vital Signs BP (!) 144/76 (BP Location: Right Arm)    Pulse (!) 110    Temp 98.4 F (36.9 C) (Oral)    Resp 18    Ht 5\' 4"  (1.626 m)    Wt 59.9 kg    LMP 06/21/2019    SpO2  100%    BMI 22.66 kg/m   Physical Exam Vitals signs and nursing note reviewed.  Constitutional:      General: She is not in acute distress.    Appearance: She is well-developed. She is not diaphoretic.  HENT:     Head: Normocephalic and atraumatic.  Eyes:     Pupils: Pupils are equal, round, and reactive to light.  Neck:     Musculoskeletal: Normal range of motion and neck supple.  Cardiovascular:     Rate and Rhythm: Regular rhythm. Tachycardia present.     Heart sounds: No murmur. No friction rub. No gallop.   Pulmonary:     Effort: Pulmonary effort is normal.     Breath sounds: No wheezing or rales.  Abdominal:     General: There is no distension.     Palpations: Abdomen is soft.     Tenderness: There is no abdominal tenderness.  Musculoskeletal:        General: No tenderness.  Skin:    General: Skin is warm and dry.  Neurological:     Mental Status: She is alert and oriented to person, place, and time.  Psychiatric:        Behavior: Behavior normal.      ED Treatments / Results  Labs (all labs ordered are listed, but only abnormal results are displayed) Labs Reviewed  COMPREHENSIVE METABOLIC PANEL - Abnormal; Notable for the following components:      Result Value   Sodium 124 (*)    Potassium 3.4 (*)    Chloride 68 (*)    CO2 36 (*)    Glucose, Bld 104 (*)    BUN 38 (*)    Creatinine, Ser 1.17 (*)    Calcium 10.4 (*)    Total Protein 9.2 (*)    Albumin 5.5 (*)    Total Bilirubin 2.1 (*)    GFR calc non Af Amer 59 (*)    Anion gap 20 (*)    All other components within normal limits  CBC - Abnormal; Notable for the following components:   WBC 24.3 (*)    RBC 5.61 (*)    Hemoglobin 17.7 (*)    HCT 50.1 (*)    Platelets 401 (*)    All other components within normal limits  MAGNESIUM - Abnormal; Notable for the following components:   Magnesium 2.9 (*)    All other components within normal limits  URINALYSIS, ROUTINE W REFLEX MICROSCOPIC - Abnormal;  Notable for the following components:   Hgb urine dipstick SMALL (*)    Bilirubin Urine SMALL (*)    Ketones, ur 40 (*)    Protein, ur 30 (*)    All other components within normal limits  LACTIC ACID,  PLASMA - Abnormal; Notable for the following components:   Lactic Acid, Venous 2.6 (*)    All other components within normal limits  URINALYSIS, MICROSCOPIC (REFLEX) - Abnormal; Notable for the following components:   Bacteria, UA RARE (*)    All other components within normal limits  LIPASE, BLOOD  LACTIC ACID, PLASMA    EKG None  Radiology No results found.  Procedures Procedures (including critical care time)  Medications Ordered in ED Medications  sodium chloride flush (NS) 0.9 % injection 3 mL (3 mLs Intravenous Given 06/21/19 2108)  lactated ringers bolus 1,000 mL (0 mLs Intravenous Stopped 06/21/19 2302)  lactated ringers bolus 1,000 mL (1,000 mLs Intravenous New Bag/Given 06/21/19 2302)     Initial Impression / Assessment and Plan / ED Course  I have reviewed the triage vital signs and the nursing notes.  Pertinent labs & imaging results that were available during my care of the patient were reviewed by me and considered in my medical decision making (see chart for details).        38 yo F with a chief complaints of fatigue after having her typical episode of cyclic vomiting syndrome.  States she had a very large amounts of emesis over the weekend.  Since then she has felt very tired and has had trouble standing up to take a shower.  She says normally she goes to sleep and sleeps for about 12 hours straight after 1 these episodes and she has had some insomnia.  We will obtain electrolyte at evaluation give a bolus of IV fluids reassess.  The patient's electrolytes are for the most part unremarkable.  Her potassium is very mildly low.  She does have a significant anion gap, it seems that she had this in her last visit here as well.  I suspect that this is most likely  secondary to dehydration based on her presentation.  She also has a very significant leukocytosis.  She is denying any infectious symptoms.  Her lactate was mildly elevated, ketones of 40 in the urine.  On reassessment the patient is feeling much better and is requesting discharge home so she can go to sleep.  We will have her follow-up with her family doctor in the office.  Have her push fluids at home.  Her heart rate had improved significantly on my repeat exam.  11:19 PM:  I have discussed the diagnosis/risks/treatment options with the patient and family and believe the pt to be eligible for discharge home to follow-up with PCP. We also discussed returning to the ED immediately if new or worsening sx occur. We discussed the sx which are most concerning (e.g., sudden worsening pain, fever, inability to tolerate by mouth) that necessitate immediate return. Medications administered to the patient during their visit and any new prescriptions provided to the patient are listed below.  Medications given during this visit Medications  sodium chloride flush (NS) 0.9 % injection 3 mL (3 mLs Intravenous Given 06/21/19 2108)  lactated ringers bolus 1,000 mL (0 mLs Intravenous Stopped 06/21/19 2302)  lactated ringers bolus 1,000 mL (1,000 mLs Intravenous New Bag/Given 06/21/19 2302)     The patient appears reasonably screen and/or stabilized for discharge and I doubt any other medical condition or other [facility] requiring further screening, evaluation, or treatment in the ED at this time prior to discharge.    Final Clinical Impressions(s) / ED Diagnoses   Final diagnoses:  Dehydration  Cyclic vomiting syndrome    ED Discharge Orders  None       Deno Etienne, DO 06/21/19 2319

## 2019-06-21 NOTE — ED Notes (Signed)
Date and time results received: 06/21/19 2214 (use smartphrase ".now" to insert current time)  Test: lactic acid Critical Value: 2.6  Name of Provider Notified: Dr. Tyrone Nine  Orders Received? Or Actions Taken?: no new orders

## 2019-07-04 ENCOUNTER — Encounter: Payer: Self-pay | Admitting: Advanced Practice Midwife

## 2019-07-04 ENCOUNTER — Other Ambulatory Visit (HOSPITAL_COMMUNITY)
Admission: RE | Admit: 2019-07-04 | Discharge: 2019-07-04 | Disposition: A | Discharge status: Home / Self Care | Payer: Medicaid Other | Source: Ambulatory Visit | Attending: Obstetrics and Gynecology | Admitting: Obstetrics and Gynecology

## 2019-07-04 ENCOUNTER — Other Ambulatory Visit: Payer: Self-pay

## 2019-07-04 ENCOUNTER — Ambulatory Visit: Payer: Medicaid Other | Admitting: Advanced Practice Midwife

## 2019-07-04 VITALS — BP 121/73 | HR 118 | Temp 98.5°F | Ht 64.0 in | Wt 139.2 lb

## 2019-07-04 DIAGNOSIS — Z01419 Encounter for gynecological examination (general) (routine) without abnormal findings: Secondary | ICD-10-CM | POA: Insufficient documentation

## 2019-07-04 DIAGNOSIS — Z0000 Encounter for general adult medical examination without abnormal findings: Secondary | ICD-10-CM | POA: Diagnosis not present

## 2019-07-04 DIAGNOSIS — Z113 Encounter for screening for infections with a predominantly sexual mode of transmission: Secondary | ICD-10-CM | POA: Insufficient documentation

## 2019-07-04 DIAGNOSIS — N6310 Unspecified lump in the right breast, unspecified quadrant: Secondary | ICD-10-CM

## 2019-07-04 DIAGNOSIS — L739 Follicular disorder, unspecified: Secondary | ICD-10-CM

## 2019-07-04 DIAGNOSIS — D259 Leiomyoma of uterus, unspecified: Secondary | ICD-10-CM

## 2019-07-04 NOTE — Progress Notes (Addendum)
GYN presents for AEX/STD testing.  She wants to have a baby, she has not had sex in the last 3 years.  Thinks she has Fibroids. GAD-7=19

## 2019-07-04 NOTE — Patient Instructions (Signed)
Preparing for Pregnancy If you are considering becoming pregnant, make an appointment to see your regular health care provider to learn how to prepare for a safe and healthy pregnancy (preconception care). During a preconception care visit, your health care provider will:  Do a complete physical exam, including a Pap test.  Take a complete medical history.  Give you information, answer your questions, and help you resolve problems. Preconception checklist Medical history  Tell your health care provider about any current or past medical conditions. Your pregnancy or your ability to become pregnant may be affected by chronic conditions, such as diabetes, chronic hypertension, and thyroid problems.  Include your family's medical history as well as your partner's medical history.  Tell your health care provider about any history of STIs (sexually transmitted infections).These can affect your pregnancy. In some cases, they can be passed to your baby. Discuss any concerns that you have about STIs.  If indicated, discuss the benefits of genetic testing. This testing will show whether there are any genetic conditions that may be passed from you or your partner to your baby.  Tell your health care provider about: ? Any problems you have had with conception or pregnancy. ? Any medicines you take. These include vitamins, herbal supplements, and over-the-counter medicines. ? Your history of immunizations. Discuss any vaccinations that you may need. Diet  Ask your health care provider what to include in a healthy diet that has a balance of nutrients. This is especially important when you are pregnant or preparing to become pregnant.  Ask your health care provider to help you reach a healthy weight before pregnancy. ? If you are overweight, you may be at higher risk for certain complications, such as high blood pressure, diabetes, and preterm birth. ? If you are underweight, you are more likely to  have a baby who has a low birth weight. Lifestyle, work, and home  Let your health care provider know: ? About any lifestyle habits that you have, such as alcohol use, drug use, or smoking. ? About recreational activities that may put you at risk during pregnancy, such as downhill skiing and certain exercise programs. ? Tell your health care provider about any international travel, especially any travel to places with an active Zika virus outbreak. ? About harmful substances that you may be exposed to at work or at home. These include chemicals, pesticides, radiation, or even litter boxes. ? If you do not feel safe at home. Mental health  Tell your health care provider about: ? Any history of mental health conditions, including feelings of depression, sadness, or anxiety. ? Any medicines that you take for a mental health condition. These include herbs and supplements. Home instructions to prepare for pregnancy Lifestyle   Eat a balanced diet. This includes fresh fruits and vegetables, whole grains, lean meats, low-fat dairy products, healthy fats, and foods that are high in fiber. Ask to meet with a nutritionist or registered dietitian for assistance with meal planning and goals.  Get regular exercise. Try to be active for at least 30 minutes a day on most days of the week. Ask your health care provider which activities are safe during pregnancy.  Do not use any products that contain nicotine or tobacco, such as cigarettes and e-cigarettes. If you need help quitting, ask your health care provider.  Do not drink alcohol.  Do not take illegal drugs.  Maintain a healthy weight. Ask your health care provider what weight range is right for you. General   instructions  Keep an accurate record of your menstrual periods. This makes it easier for your health care provider to determine your baby's due date.  Begin taking prenatal vitamins and folic acid supplements daily as directed by your  health care provider.  Manage any chronic conditions, such as high blood pressure and diabetes, as told by your health care provider. This is important. How do I know that I am pregnant? You may be pregnant if you have been sexually active and you miss your period. Symptoms of early pregnancy include:  Mild cramping.  Very light vaginal bleeding (spotting).  Feeling unusually tired.  Nausea and vomiting (morning sickness). If you have any of these symptoms and you suspect that you might be pregnant, you can take a home pregnancy test. These tests check for a hormone in your urine (human chorionic gonadotropin, or hCG). A woman's body begins to make this hormone during early pregnancy. These tests are very accurate. Wait until at least the first day after you miss your period to take one. If the test shows that you are pregnant (you get a positive result), call your health care provider to make an appointment for prenatal care. What should I do if I become pregnant?      Make an appointment with your health care provider as soon as you suspect you are pregnant.  Do not use any products that contain nicotine, such as cigarettes, chewing tobacco, and e-cigarettes. If you need help quitting, ask your health care provider.  Do not drink alcoholic beverages. Alcohol is related to a number of birth defects.  Avoid toxic odors and chemicals.  You may continue to have sexual intercourse if it does not cause pain or other problems, such as vaginal bleeding. This information is not intended to replace advice given to you by your health care provider. Make sure you discuss any questions you have with your health care provider. Document Released: 08/06/2008 Document Revised: 08/26/2017 Document Reviewed: 03/15/2016 Elsevier Patient Education  2020 Elsevier Inc.        
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
## 2019-07-05 ENCOUNTER — Other Ambulatory Visit: Payer: Self-pay | Admitting: Advanced Practice Midwife

## 2019-07-05 DIAGNOSIS — N6310 Unspecified lump in the right breast, unspecified quadrant: Secondary | ICD-10-CM

## 2019-07-06 LAB — HSV(HERPES SMPLX)ABS-I+II(IGG+IGM)-BLD
HSV 1 Glycoprotein G Ab, IgG: 1.39 index — ABNORMAL HIGH (ref 0.00–0.90)
HSV 2 IgG, Type Spec: <0.91 index (ref 0.00–0.90)
HSVI/II Comb IgM: 2.07 Ratio — ABNORMAL HIGH (ref 0.00–0.90)

## 2019-07-06 LAB — HEPATITIS B SURFACE ANTIGEN: Hepatitis B Surface Ag: NEGATIVE

## 2019-07-06 LAB — HEPATITIS C ANTIBODY: Hep C Virus Ab: <0.1 s/co ratio (ref 0.0–0.9)

## 2019-07-06 LAB — RPR: RPR Ser Ql: NONREACTIVE

## 2019-07-06 LAB — HIV ANTIBODY (ROUTINE TESTING W REFLEX): HIV Screen 4th Generation wRfx: NONREACTIVE

## 2019-07-10 LAB — CYTOLOGY - PAP
Comment: NEGATIVE
Diagnosis: NEGATIVE
High risk HPV: NEGATIVE

## 2019-07-11 ENCOUNTER — Ambulatory Visit: Payer: Medicaid Other | Admitting: Advanced Practice Midwife

## 2019-07-11 ENCOUNTER — Other Ambulatory Visit: Payer: Self-pay | Admitting: Advanced Practice Midwife

## 2019-07-11 ENCOUNTER — Encounter: Payer: Self-pay | Admitting: Advanced Practice Midwife

## 2019-07-11 ENCOUNTER — Other Ambulatory Visit: Payer: Self-pay

## 2019-07-11 VITALS — BP 106/81 | HR 115 | Ht 65.0 in | Wt 137.0 lb

## 2019-07-11 DIAGNOSIS — L739 Follicular disorder, unspecified: Secondary | ICD-10-CM

## 2019-07-11 LAB — CERVICOVAGINAL ANCILLARY ONLY
Bacterial Vaginitis (gardnerella): NEGATIVE
Candida Glabrata: NEGATIVE
Candida Vaginitis: NEGATIVE
Chlamydia: NEGATIVE
Comment: NEGATIVE
Comment: NEGATIVE
Comment: NEGATIVE
Comment: NEGATIVE
Comment: NEGATIVE
Comment: NORMAL
Neisseria Gonorrhea: NEGATIVE
Trichomonas: NEGATIVE

## 2019-07-11 NOTE — Progress Notes (Signed)
  GYNECOLOGY PROGRESS NOTE  History:  38 y.o. [id number] presents to [facility] office today for problem gyn visit. She was scheduled in [facility] office from [place] last week since TCA is back ordered. She has labial lesions c/w folliculitis, were previous abscesses that have resolved leaving calcifications. She desires these removed and TCA worked for her for similar lesions a few years ago. She has no other problems.  She desires another pregnancy.    The following portions of the patient's history were reviewed and updated as appropriate: allergies, current medications, past family history, past medical history, past social history, past surgical history and problem list. Last pap smear on 07/04/19 was normal, negative HRHPV.  Review of Systems:  Pertinent items are noted in HPI.   Objective:  Physical Exam Blood pressure 106/81, pulse (!) 115, height 5\' 5"  (1.651 m), weight 137 lb (62.1 kg), last menstrual period 06/21/2019. VS reviewed, nursing note reviewed,  Constitutional: well developed, well nourished, no distress HEENT: normocephalic CV: normal rate Pulm/chest wall: normal effort Breast Exam: deferred Abdomen: soft Neuro: alert and oriented x 3 Skin: warm, dry Psych: affect normal Pelvic exam: Cervix pink, visually closed, without lesion, scant white creamy discharge, vaginal walls and external genitalia normal Bimanual exam: Cervix 0/long/high, firm, anterior, neg CMT, uterus nontender, nonenlarged, adnexa without tenderness, enlargement, or mass  Assessment & Plan:  1. Folliculitis --Pt desires TCA treatment for remnants of previous abscess with 2 calcified tiny areas on right labia where abscess was in the past.  TCA treatment helped remove these in the past so she desires this again. --Petroleum jelly applied to prevent damage of surrounding tissue and TCA applied to 2 tiny lesions on right labia. Pt tolerated well. --She desires conception, so discussed and plans to seek care ASAP in  early pregnancy for vaginal progesterone with hx of frequent miscarriage.  Fatima Blank, CNM 12:19 PM

## 2019-07-17 ENCOUNTER — Ambulatory Visit (HOSPITAL_COMMUNITY): Admission: RE | Admit: 2019-07-17 | Payer: Medicaid Other | Source: Ambulatory Visit

## 2019-07-18 ENCOUNTER — Other Ambulatory Visit: Payer: Medicaid Other

## 2019-07-19 ENCOUNTER — Ambulatory Visit
Admission: RE | Admit: 2019-07-19 | Discharge: 2019-07-19 | Disposition: A | Discharge status: Home / Self Care | Payer: Medicaid Other | Source: Ambulatory Visit | Attending: Advanced Practice Midwife | Admitting: Advanced Practice Midwife

## 2019-07-19 ENCOUNTER — Other Ambulatory Visit: Payer: Self-pay

## 2019-07-19 DIAGNOSIS — N6310 Unspecified lump in the right breast, unspecified quadrant: Secondary | ICD-10-CM

## 2019-07-24 ENCOUNTER — Other Ambulatory Visit: Payer: Medicaid Other

## 2019-08-07 ENCOUNTER — Ambulatory Visit (INDEPENDENT_AMBULATORY_CARE_PROVIDER_SITE_OTHER): Payer: Medicaid Other

## 2019-08-07 ENCOUNTER — Other Ambulatory Visit: Payer: Self-pay

## 2019-08-07 ENCOUNTER — Other Ambulatory Visit (HOSPITAL_COMMUNITY)
Admission: RE | Admit: 2019-08-07 | Discharge: 2019-08-07 | Disposition: A | Discharge status: Home / Self Care | Payer: Medicaid Other | Source: Ambulatory Visit | Attending: Obstetrics | Admitting: Obstetrics

## 2019-08-07 VITALS — BP 123/81 | HR 106 | Ht 65.0 in | Wt 138.0 lb

## 2019-08-07 DIAGNOSIS — Z202 Contact with and (suspected) exposure to infections with a predominantly sexual mode of transmission: Secondary | ICD-10-CM | POA: Insufficient documentation

## 2019-08-07 DIAGNOSIS — Z3169 Encounter for other general counseling and advice on procreation: Secondary | ICD-10-CM
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
## 2019-08-07 NOTE — Progress Notes (Signed)
Pt here today for nurse visit for STI evaluation. Pt plans a pregnancy in the near future and desires testing for anemia and diabetes.  Preconception counseling and discussion about planning for pregnancy was done at prior office visits on 10/27 and 11/3 of this year.  CBC and A1C ordered. I did not see the patient at today's visit.

## 2019-08-07 NOTE — Addendum Note (Signed)
Addended by: Fatima Blank A on: 08/07/2019 03:16 PM   Modules accepted: Orders

## 2019-08-08 LAB — HEMOGLOBIN A1C
Est. average glucose Bld gHb Est-mCnc: 103 mg/dL
Hgb A1c MFr Bld: 5.2 % (ref 4.8–5.6)

## 2019-08-08 LAB — CBC
Hematocrit: 39.2 % (ref 34.0–46.6)
Hemoglobin: 13.1 g/dL (ref 11.1–15.9)
MCH: 32 pg (ref 26.6–33.0)
MCHC: 33.4 g/dL (ref 31.5–35.7)
MCV: 96 fL (ref 79–97)
Platelets: 381 10*3/uL (ref 150–450)
RBC: 4.1 x10E6/uL (ref 3.77–5.28)
RDW: 14 % (ref 11.7–15.4)
WBC: 6.6 10*3/uL (ref 3.4–10.8)

## 2019-08-08 LAB — HEPB+HEPC+HIV PANEL
HIV Screen 4th Generation wRfx: NONREACTIVE
Hep B C IgM: NEGATIVE
Hep B Core Total Ab: NEGATIVE
Hep B E Ab: NEGATIVE
Hep B E Ag: NEGATIVE
Hep B Surface Ab, Qual: NONREACTIVE
Hep C Virus Ab: <0.1 s/co ratio (ref 0.0–0.9)
Hepatitis B Surface Ag: NEGATIVE

## 2019-08-08 LAB — CERVICOVAGINAL ANCILLARY ONLY
Bacterial Vaginitis (gardnerella): NEGATIVE
Candida Glabrata: NEGATIVE
Candida Vaginitis: NEGATIVE
Chlamydia: NEGATIVE
Comment: NEGATIVE
Comment: NEGATIVE
Comment: NEGATIVE
Comment: NEGATIVE
Comment: NEGATIVE
Comment: NORMAL
Neisseria Gonorrhea: NEGATIVE
Trichomonas: NEGATIVE

## 2019-08-08 LAB — RPR: RPR Ser Ql: NONREACTIVE

## 2020-01-23 ENCOUNTER — Encounter (HOSPITAL_BASED_OUTPATIENT_CLINIC_OR_DEPARTMENT_OTHER): Payer: Self-pay | Admitting: *Deleted

## 2020-01-23 ENCOUNTER — Emergency Department (HOSPITAL_BASED_OUTPATIENT_CLINIC_OR_DEPARTMENT_OTHER)
Admission: EM | Admit: 2020-01-23 | Discharge: 2020-01-23 | Disposition: A | Discharge status: Home / Self Care | Payer: Medicaid Other | Attending: Emergency Medicine | Admitting: Emergency Medicine

## 2020-01-23 ENCOUNTER — Emergency Department (HOSPITAL_BASED_OUTPATIENT_CLINIC_OR_DEPARTMENT_OTHER): Payer: Medicaid Other

## 2020-01-23 ENCOUNTER — Other Ambulatory Visit: Payer: Self-pay

## 2020-01-23 DIAGNOSIS — Y999 Unspecified external cause status: Secondary | ICD-10-CM | POA: Diagnosis not present

## 2020-01-23 DIAGNOSIS — S76011A Strain of muscle, fascia and tendon of right hip, initial encounter: Secondary | ICD-10-CM | POA: Diagnosis not present

## 2020-01-23 DIAGNOSIS — S79911A Unspecified injury of right hip, initial encounter: Secondary | ICD-10-CM | POA: Insufficient documentation

## 2020-01-23 DIAGNOSIS — Z91040 Latex allergy status: Secondary | ICD-10-CM | POA: Insufficient documentation

## 2020-01-23 DIAGNOSIS — R0789 Other chest pain: Secondary | ICD-10-CM | POA: Insufficient documentation

## 2020-01-23 DIAGNOSIS — Z881 Allergy status to other antibiotic agents status: Secondary | ICD-10-CM | POA: Diagnosis not present

## 2020-01-23 DIAGNOSIS — S0990XA Unspecified injury of head, initial encounter: Secondary | ICD-10-CM | POA: Diagnosis not present

## 2020-01-23 DIAGNOSIS — Z79899 Other long term (current) drug therapy: Secondary | ICD-10-CM | POA: Diagnosis not present

## 2020-01-23 DIAGNOSIS — F17210 Nicotine dependence, cigarettes, uncomplicated: Secondary | ICD-10-CM | POA: Diagnosis not present

## 2020-01-23 DIAGNOSIS — R079 Chest pain, unspecified: Secondary | ICD-10-CM

## 2020-01-23 DIAGNOSIS — M542 Cervicalgia: Secondary | ICD-10-CM | POA: Insufficient documentation

## 2020-01-23 DIAGNOSIS — Z888 Allergy status to other drugs, medicaments and biological substances status: Secondary | ICD-10-CM | POA: Diagnosis not present

## 2020-01-23 DIAGNOSIS — Y9389 Activity, other specified: Secondary | ICD-10-CM | POA: Diagnosis not present

## 2020-01-23 DIAGNOSIS — Y9289 Other specified places as the place of occurrence of the external cause: Secondary | ICD-10-CM | POA: Insufficient documentation

## 2020-01-23 HISTORY — DX: Adjustment disorder with mixed anxiety and depressed mood: F43.23

## 2020-01-23 HISTORY — DX: Radiculopathy, cervical region: M54.12

## 2020-01-23 MED ORDER — LORAZEPAM 1 MG PO TABS: 1.0000 mg | ORAL_TABLET | Freq: Once | ORAL | Status: DC
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
## 2020-01-23 NOTE — Progress Notes (Signed)
Imaging pending Pregnancy test Claiborne Billings RN aware
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
## 2020-01-23 NOTE — ED Notes (Signed)
PT refused urine to this tech and Radiology Tech. PT signed wavier for scans Per Radiology.

## 2020-02-15 ENCOUNTER — Emergency Department (HOSPITAL_BASED_OUTPATIENT_CLINIC_OR_DEPARTMENT_OTHER)
Admission: EM | Admit: 2020-02-15 | Discharge: 2020-02-15 | Disposition: A | Discharge status: Home / Self Care | Payer: Medicaid Other | Attending: Emergency Medicine | Admitting: Emergency Medicine

## 2020-02-15 ENCOUNTER — Other Ambulatory Visit: Payer: Self-pay

## 2020-02-15 ENCOUNTER — Emergency Department (HOSPITAL_BASED_OUTPATIENT_CLINIC_OR_DEPARTMENT_OTHER): Payer: Medicaid Other

## 2020-02-15 ENCOUNTER — Encounter (HOSPITAL_BASED_OUTPATIENT_CLINIC_OR_DEPARTMENT_OTHER): Payer: Self-pay | Admitting: Emergency Medicine

## 2020-02-15 DIAGNOSIS — F17210 Nicotine dependence, cigarettes, uncomplicated: Secondary | ICD-10-CM | POA: Diagnosis not present

## 2020-02-15 DIAGNOSIS — R519 Headache, unspecified: Secondary | ICD-10-CM | POA: Insufficient documentation

## 2020-02-15 MED ORDER — METOCLOPRAMIDE HCL 10 MG PO TABS
10.0000 mg | ORAL_TABLET | Freq: Four times a day (QID) | ORAL | 0 refills | Status: AC | PRN
Start: 2020-02-15 — End: ?

## 2020-02-15 MED ORDER — ACETAMINOPHEN 500 MG PO TABS
1000.0000 mg | ORAL_TABLET | Freq: Once | ORAL | Status: AC
  Administered 2020-02-15: 1000 mg via ORAL
  Filled 2020-02-15: qty 2

## 2020-02-15 MED ORDER — IBUPROFEN 800 MG PO TABS
800.0000 mg | ORAL_TABLET | Freq: Once | ORAL | Status: AC
  Administered 2020-02-15: 800 mg via ORAL
  Filled 2020-02-15: qty 1

## 2020-02-15 MED ORDER — DIVALPROEX SODIUM ER 250 MG PO TB24
500.0000 mg | ORAL_TABLET | Freq: Every day | ORAL | Status: DC
  Administered 2020-02-15: 500 mg via ORAL
  Filled 2020-02-15: qty 2

## 2020-02-15 MED ORDER — DIVALPROEX SODIUM 500 MG PO DR TAB
500.0000 mg | DELAYED_RELEASE_TABLET | Freq: Two times a day (BID) | ORAL | Status: DC
  Filled 2020-02-15: qty 1
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
## 2020-03-23 ENCOUNTER — Other Ambulatory Visit: Payer: Self-pay

## 2020-03-23 DIAGNOSIS — Z91040 Latex allergy status: Secondary | ICD-10-CM | POA: Diagnosis not present

## 2020-03-23 DIAGNOSIS — F430 Acute stress reaction: Secondary | ICD-10-CM | POA: Insufficient documentation

## 2020-03-23 DIAGNOSIS — F410 Panic disorder [episodic paroxysmal anxiety] without agoraphobia: Secondary | ICD-10-CM | POA: Diagnosis not present

## 2020-03-23 DIAGNOSIS — F17210 Nicotine dependence, cigarettes, uncomplicated: Secondary | ICD-10-CM | POA: Insufficient documentation

## 2020-03-23 DIAGNOSIS — Z91010 Allergy to peanuts: Secondary | ICD-10-CM | POA: Diagnosis not present

## 2020-03-24 ENCOUNTER — Encounter (HOSPITAL_BASED_OUTPATIENT_CLINIC_OR_DEPARTMENT_OTHER): Payer: Self-pay

## 2020-03-24 ENCOUNTER — Emergency Department (HOSPITAL_BASED_OUTPATIENT_CLINIC_OR_DEPARTMENT_OTHER)
Admission: EM | Admit: 2020-03-24 | Discharge: 2020-03-24 | Disposition: A | Discharge status: Home / Self Care | Payer: Medicaid Other | Attending: Emergency Medicine | Admitting: Emergency Medicine

## 2020-03-24 DIAGNOSIS — F410 Panic disorder [episodic paroxysmal anxiety] without agoraphobia: Secondary | ICD-10-CM

## 2020-03-24 MED ORDER — ALPRAZOLAM 0.5 MG PO TABS: 0.5000 mg | ORAL_TABLET | Freq: Two times a day (BID) | ORAL | 0 refills | Status: DC | PRN

## 2020-03-24 NOTE — ED Triage Notes (Signed)
Pt rushing into the triage room with a flight of ideas and rapid speech. Pt repeating "I am not going to die today, I'm having a heart attack." Pt states she started having tingling all over and tightness in her throat. Pt noted to be hyperventilating. Pt denies chest pain. Pt states she has been under a lot of stress and that she has been having hyperventilation attacks a lot lately. Able to calm pt down in triage and pt states she is starting to feel better.

## 2020-03-24 NOTE — ED Notes (Signed)
EDP at bedside  
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
## 2020-04-02 DIAGNOSIS — F411 Generalized anxiety disorder: Secondary | ICD-10-CM | POA: Insufficient documentation

## 2020-04-22 IMAGING — MG DIGITAL DIAGNOSTIC BILAT W/ TOMO W/ CAD
6 of 12 series · 6 of 36 positions shown · non-contrast
Comparison: Previous exam(s).

CLINICAL DATA: Patient describes a tubular/rope-like mass in the
upper-outer quadrant of the RIGHT breast (extending to 9 o'clock
axis) measuring approximately 4 x 1 cm.

EXAM:
DIGITAL DIAGNOSTIC BILATERAL MAMMOGRAM WITH CAD AND TOMO
ULTRASOUND RIGHT BREAST

[R TAN synth-2D]
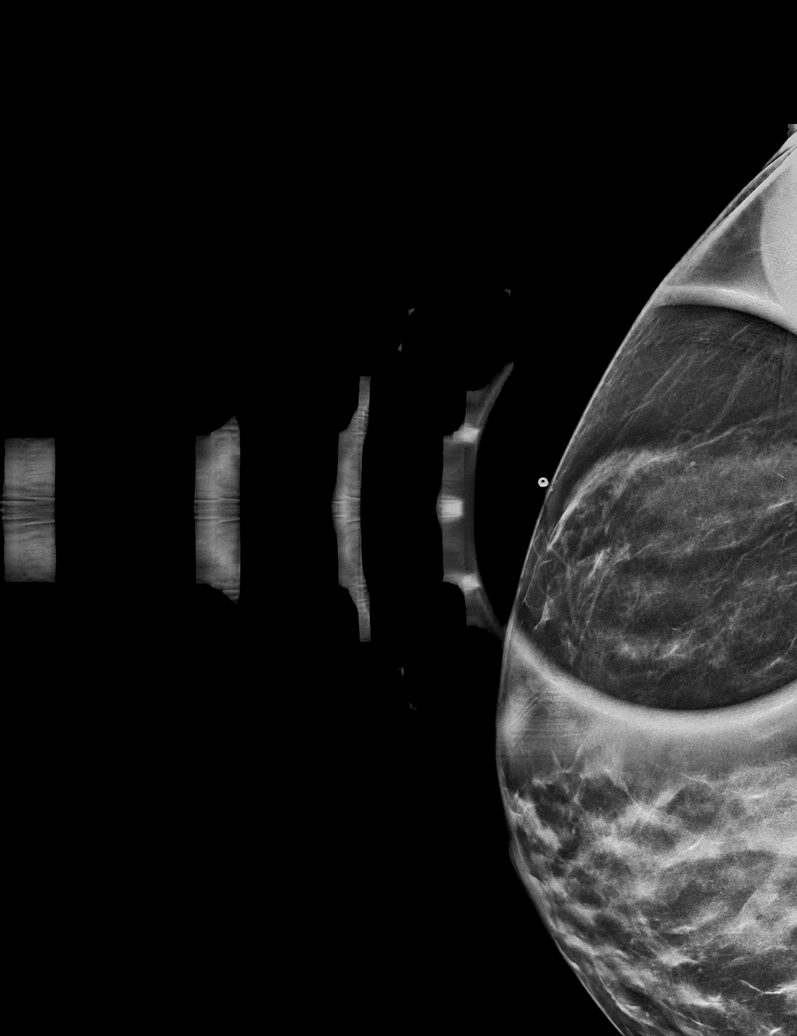

[R CC synth-2D]
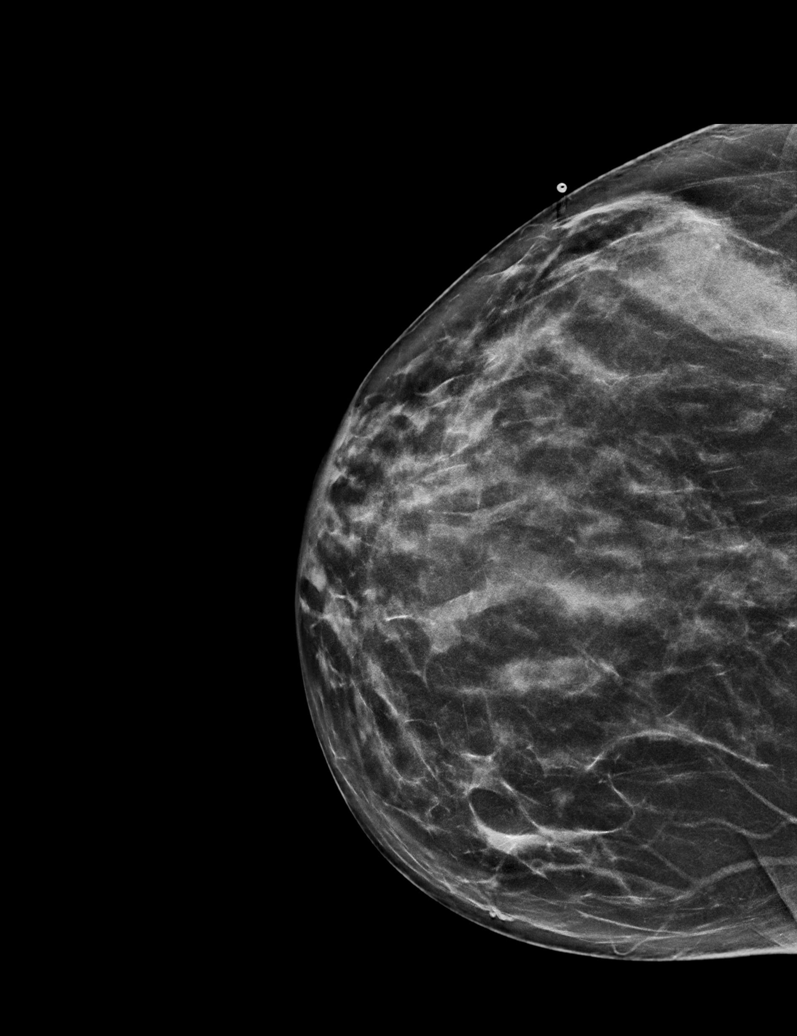

[L CC synth-2D]
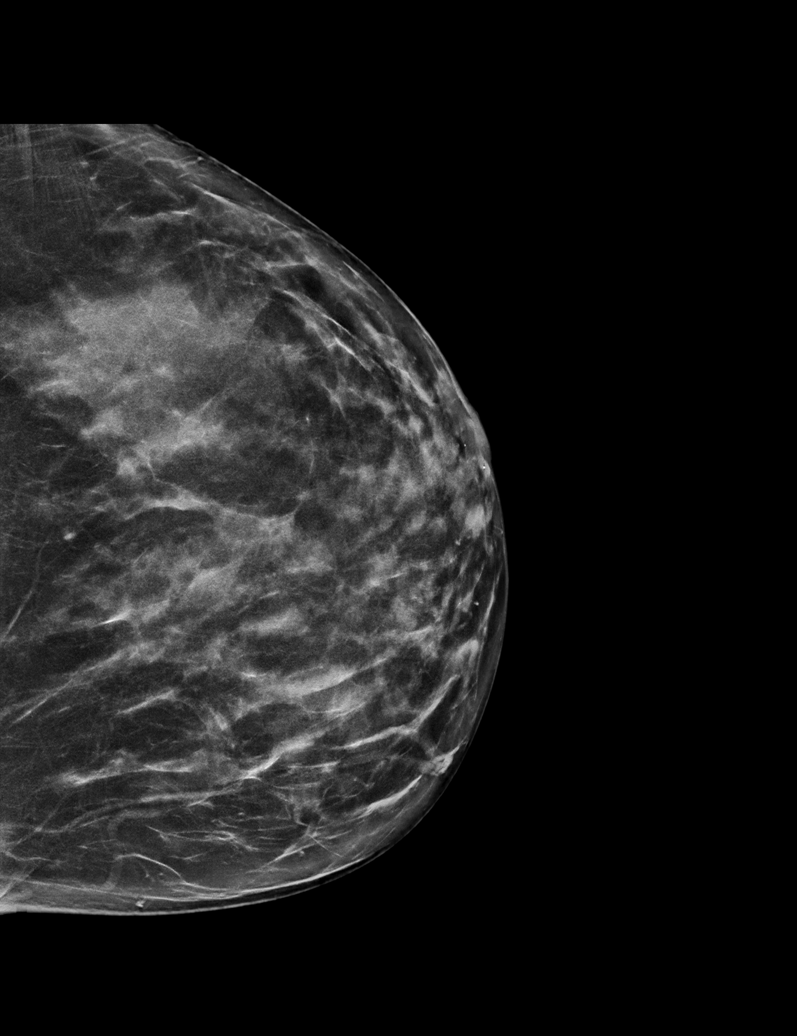

[R MLO synth-2D (1 of 2)]
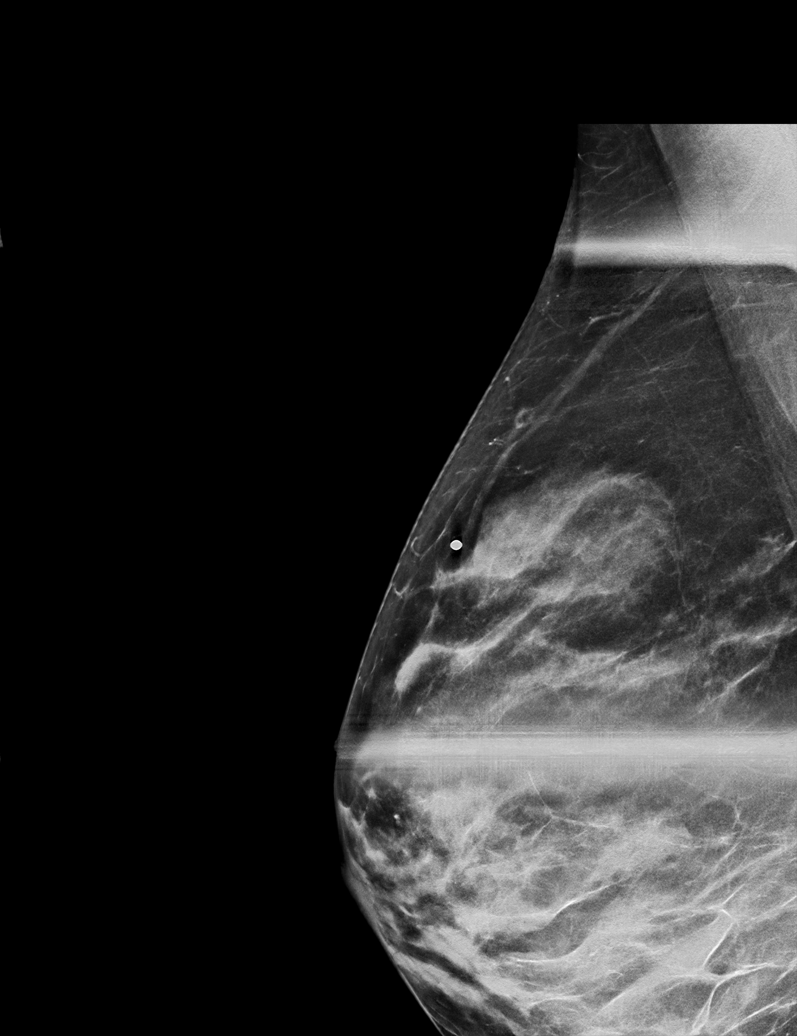

[L MLO synth-2D]
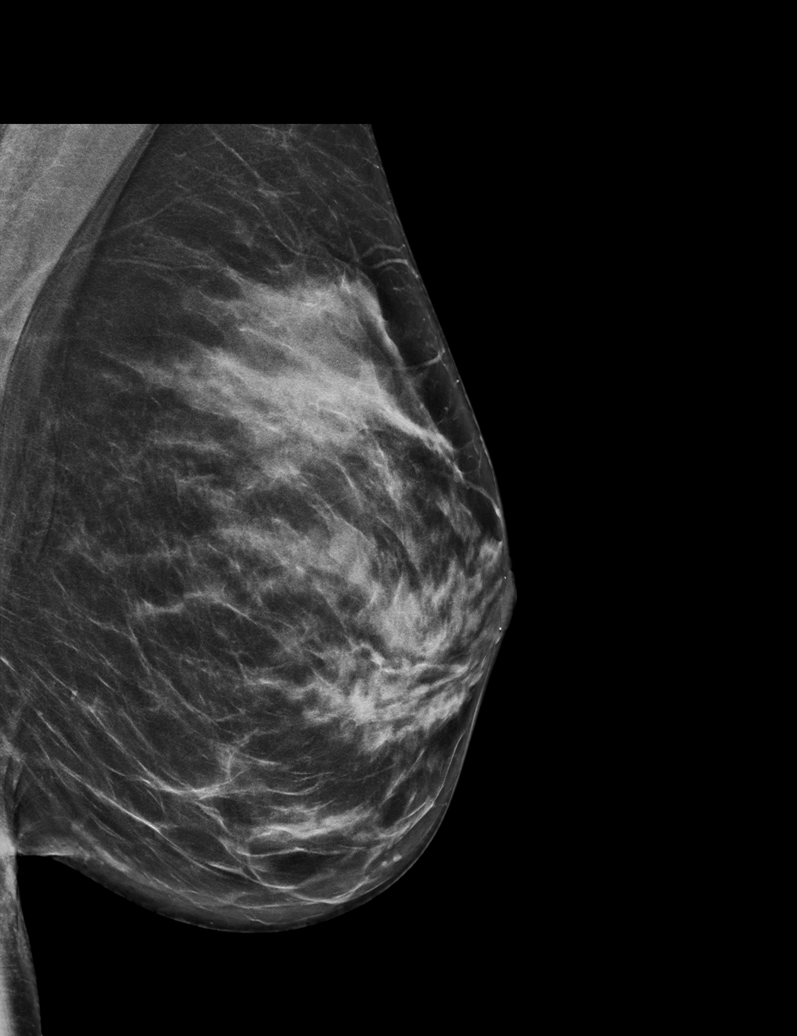

[R MLO synth-2D (2 of 2)]
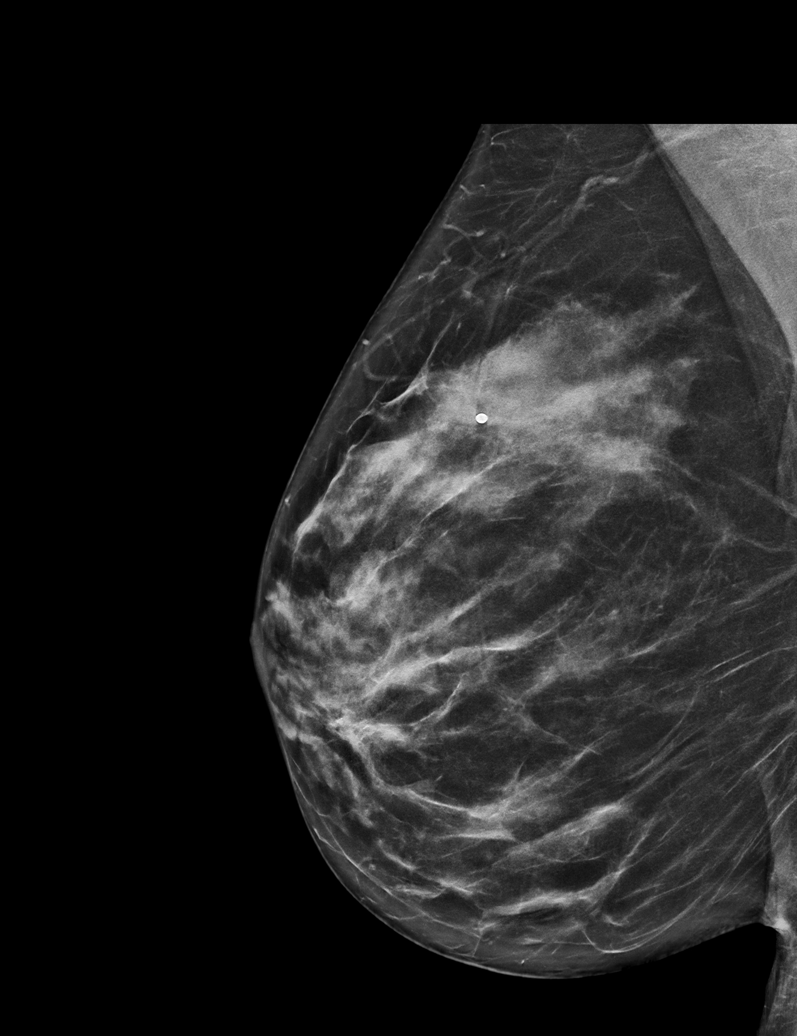

[6 of 36 positions shown; findings below may reference images not displayed]

ACR Breast Density Category c: The breast tissue is heterogeneously
dense, which may obscure small masses.
FINDINGS: There are no new dominant masses, suspicious calcifications or
secondary signs of malignancy within either breast. Specifically,
there is no suspicious findings within the upper outer quadrant of
the RIGHT breast corresponding to the area of clinical concern.

Mammographic images were processed with CAD.

Targeted ultrasound is performed, evaluating the upper-outer
quadrant to 9 o'clock axis of the RIGHT breast as directed by the
patient, showing a ridge of normal dense fibroglandular tissue the
9-10 o'clock axis corresponding to the area of clinical concern.
There are 2 small benign cysts within the RIGHT breast at the 9:30
o'clock axis, measuring 7 mm and 6 mm respectively, likely
corresponding as incidental findings but potentially contributory.

No suspicious solid or cystic mass is identified within the
upper-outer quadrant of the RIGHT breast by ultrasound.
IMPRESSION: No evidence of malignancy within either breast. Ridge of normal
dense fibroglandular tissue within the upper outer quadrant of the
RIGHT breast corresponding to the area of clinical concern.

RECOMMENDATION:
1. Screening mammogram at age 40 unless there are persistent or
intervening clinical concerns. (Code:[id number])
2. The patient was instructed to return sooner if the area that she
feels becomes larger and/or firmer to palpation, or if a new
palpable abnormality is identified in either breast.

I have discussed the findings and recommendations with the patient.
If applicable, a reminder letter will be sent to the patient
regarding the next appointment.

BI-RADS CATEGORY  2: Benign.

## 2020-05-30 ENCOUNTER — Other Ambulatory Visit: Payer: Self-pay | Admitting: Advanced Practice Midwife

## 2020-06-20 ENCOUNTER — Other Ambulatory Visit: Payer: Self-pay | Admitting: Family Medicine

## 2020-09-16 ENCOUNTER — Encounter: Payer: Self-pay | Admitting: Advanced Practice Midwife

## 2020-09-16 ENCOUNTER — Other Ambulatory Visit (HOSPITAL_COMMUNITY)
Admission: RE | Admit: 2020-09-16 | Discharge: 2020-09-16 | Disposition: A | Discharge status: Home / Self Care | Payer: Medicaid Other | Source: Ambulatory Visit | Attending: Advanced Practice Midwife | Admitting: Advanced Practice Midwife

## 2020-09-16 ENCOUNTER — Other Ambulatory Visit: Payer: Self-pay

## 2020-09-16 ENCOUNTER — Ambulatory Visit (INDEPENDENT_AMBULATORY_CARE_PROVIDER_SITE_OTHER): Payer: Medicaid Other | Admitting: Advanced Practice Midwife

## 2020-09-16 VITALS — BP 114/78 | HR 130 | Temp 99.0°F | Ht 63.0 in | Wt 123.0 lb

## 2020-09-16 DIAGNOSIS — Z0000 Encounter for general adult medical examination without abnormal findings: Secondary | ICD-10-CM | POA: Diagnosis not present

## 2020-09-16 DIAGNOSIS — Z113 Encounter for screening for infections with a predominantly sexual mode of transmission: Secondary | ICD-10-CM | POA: Diagnosis not present

## 2020-09-16 DIAGNOSIS — F411 Generalized anxiety disorder: Secondary | ICD-10-CM

## 2020-09-16 DIAGNOSIS — Z1239 Encounter for other screening for malignant neoplasm of breast: Secondary | ICD-10-CM | POA: Diagnosis not present

## 2020-09-16 MED ORDER — BUSPIRONE HCL 10 MG PO TABS: 10.0000 mg | ORAL_TABLET | Freq: Three times a day (TID) | ORAL | 3 refills | Status: AC | PRN

## 2020-09-16 MED ORDER — HYDROXYZINE PAMOATE 25 MG PO CAPS: 25.0000 mg | ORAL_CAPSULE | Freq: Three times a day (TID) | ORAL | 5 refills | Status: AC | PRN
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
## 2020-09-16 NOTE — Patient Instructions (Signed)
Psychiatric Services °[facility]  °[address], [place], [place] °[phone] ° °[facility] °[address], [place], [place] °Helpline 336-832-9700 or 1-800-711-2635 °www..[email or web address] ° °[facility] °Walk-in Mon-Fri, 8:30-5:00 °[address], [place], [place] °336-676-6840 °www.[email or web address]  °*Bring your own interpreter at 1st visit ° °[facility] °[address], [place], [place] °[phone] °www.[email or web address]  ° °[facility]  °[address], [place], [place] °[phone] ° °[facility] °Walk-in Mon-Fri, 8am-3pm °[address], [place], [place] °[phone] °www.[facility].[email or web address] ° °Therapy/Counseling ° °[facility] °[address], [place], [place] °[phone] ° °[facility] °[address], [place], [place] °[phone] ° °[facility] °[address], [place], [place] °[phone] °*pacientes que hablen espanol, favor comunicarse con el Sr[address] [place], extension 2244 o la [address], extension 3331 para hacer una cita.  ° °[facility] °[address] [place]” °[phone] (Habla Espanol) ° °[facility] °[address], [place], [place] °[phone] ° °[facility] °[address], [place], [place] °[phone] °  °[facility]  °[address], [place], [place] °[phone] °www.[email or web address].org  °*Uninsured and underinsured, ages 19-64 ° °[facility] °[address], [place], [place] °[phone] (Habla Espanol) ° °[facility] °[address], [place], [place] °[phone] (Habla Espanol) ° °[facility] °[address], [place], [place] °[phone] (Habla  Espanol) ° °[facility] °Mon-Thurs 8:30am-8:00pm/ Fri 8:30-7:00pm °[address], [place], [place] °(3rd floor, located at corner of [address]) °Call [phone] to schedule an appointment °[email or web address] ° °[facility] °[address], [place], [place]  °336-542-2884 ° °[facility]  °[address], [place], [place] °336-375-8333 ° °Social Support °[facility] ([facility]) °(336) 373-1402 or www.[facility]ag.org °[address], [address], ,  [address] °* Recovery support and educational programs, including recovery skills classes, support groups, and one-on-one sessions with  Certified Peer Support Specialists.  ° ° °[facility] ([facility]) [place] °[facility] Helpline: (336) 370-4264 °* Family and Friends Support Group/  Contact Jack Glenn at [phone] for more information °* Family to Family Education Program and Basics Class : enroll online or email Mary Pennington at [email or web address]  °* Monthly educational meetings, contact Mitch McGee at [phone] °[email or web address]  ° ° °24- Hour Availability:  °* Health 336-832-9700 or 1-800-711-2635 ° °* [facility] Crisis (Domestic Violence, Rape, Victim Assistance) Line 336-273-7273 ° °* [facility] 1-855-788-8787 or 336-676-6840 ° °* [facility] [place] [facility]  °336-899-1505(8am-4pm only) °1-866-261-5769(after hours) ° °*[facility] Mobile Crisis Unit °1-877-626-1772 ° °*[place] National Suicide Hotline °1-800-273-8255 ° ° °

## 2020-09-16 NOTE — Progress Notes (Addendum)
GYN presents for AEX/STD screening.  C/o not feeling right, extremely stressed.  Patient wants to get back on Xanax and wants to be referred to [facility].   Last PAP 07/04/2019 Due for 4th. Mammogram 10/09/2020

## 2020-09-17 LAB — RPR+HBSAG+HCVAB+...
HIV Screen 4th Generation wRfx: NONREACTIVE
Hep C Virus Ab: <0.1 s/co ratio (ref 0.0–0.9)
Hepatitis B Surface Ag: NEGATIVE
RPR Ser Ql: NONREACTIVE

## 2020-09-17 LAB — CERVICOVAGINAL ANCILLARY ONLY
Bacterial Vaginitis (gardnerella): NEGATIVE
Candida Glabrata: NEGATIVE
Candida Vaginitis: NEGATIVE
Chlamydia: NEGATIVE
Comment: NEGATIVE
Comment: NEGATIVE
Comment: NEGATIVE
Comment: NEGATIVE
Comment: NEGATIVE
Comment: NORMAL
Neisseria Gonorrhea: NEGATIVE
Trichomonas: NEGATIVE

## 2020-09-24 ENCOUNTER — Telehealth: Payer: Self-pay

## 2020-09-24 NOTE — Telephone Encounter (Signed)
Left message for patient to call office to discuss results

## 2020-09-24 NOTE — Telephone Encounter (Signed)
-----   Message from Elvera Maria, CNM sent at 09/22/2020 10:14 AM EST ----- Pt STD testing is all negative. She does not have MyChart. Please call her to let her know about testing results.  Thank you.

## 2020-09-25 ENCOUNTER — Telehealth (HOSPITAL_COMMUNITY): Payer: Self-pay | Admitting: Professional

## 2020-10-28 ENCOUNTER — Other Ambulatory Visit: Payer: Self-pay

## 2020-10-28 ENCOUNTER — Emergency Department (HOSPITAL_BASED_OUTPATIENT_CLINIC_OR_DEPARTMENT_OTHER)
Admission: EM | Admit: 2020-10-28 | Discharge: 2020-10-28 | Disposition: A | Discharge status: Home / Self Care | Payer: Medicaid Other | Attending: Emergency Medicine | Admitting: Emergency Medicine

## 2020-10-28 ENCOUNTER — Encounter (HOSPITAL_BASED_OUTPATIENT_CLINIC_OR_DEPARTMENT_OTHER): Payer: Self-pay | Admitting: Emergency Medicine

## 2020-10-28 ENCOUNTER — Emergency Department (HOSPITAL_BASED_OUTPATIENT_CLINIC_OR_DEPARTMENT_OTHER)
Admission: EM | Admit: 2020-10-28 | Discharge: 2020-10-29 | Disposition: A | Discharge status: Home / Self Care | Payer: Medicaid Other | Source: Home / Self Care | Attending: Emergency Medicine | Admitting: Emergency Medicine

## 2020-10-28 DIAGNOSIS — R110 Nausea: Secondary | ICD-10-CM | POA: Insufficient documentation

## 2020-10-28 DIAGNOSIS — Z91040 Latex allergy status: Secondary | ICD-10-CM | POA: Insufficient documentation

## 2020-10-28 DIAGNOSIS — F17210 Nicotine dependence, cigarettes, uncomplicated: Secondary | ICD-10-CM | POA: Insufficient documentation

## 2020-10-28 DIAGNOSIS — Z91010 Allergy to peanuts: Secondary | ICD-10-CM | POA: Insufficient documentation

## 2020-10-28 DIAGNOSIS — Z79899 Other long term (current) drug therapy: Secondary | ICD-10-CM | POA: Insufficient documentation

## 2020-10-28 DIAGNOSIS — R1115 Cyclical vomiting syndrome unrelated to migraine: Secondary | ICD-10-CM | POA: Insufficient documentation

## 2020-10-28 DIAGNOSIS — Z5321 Procedure and treatment not carried out due to patient leaving prior to being seen by health care provider: Secondary | ICD-10-CM | POA: Diagnosis not present

## 2020-10-28 DIAGNOSIS — R1011 Right upper quadrant pain: Secondary | ICD-10-CM | POA: Insufficient documentation

## 2020-10-28 MED ORDER — LACTATED RINGERS IV BOLUS
1000.0000 mL | Freq: Once | INTRAVENOUS | Status: AC
  Administered 2020-10-29: 1000 mL via INTRAVENOUS

## 2020-10-28 MED ORDER — METOCLOPRAMIDE HCL 5 MG/ML IJ SOLN
10.0000 mg | Freq: Once | INTRAMUSCULAR | Status: AC
  Administered 2020-10-29: 10 mg via INTRAVENOUS
  Filled 2020-10-28: qty 2

## 2020-10-28 MED ORDER — IOHEXOL 9 MG/ML PO SOLN
500.0000 mL | ORAL | Status: AC
  Administered 2020-10-29 (×2): 500 mL via ORAL

## 2020-10-28 MED ORDER — FENTANYL CITRATE (PF) 100 MCG/2ML IJ SOLN
100.0000 ug | Freq: Once | INTRAMUSCULAR | Status: AC
  Administered 2020-10-29: 100 ug via INTRAVENOUS
  Filled 2020-10-28: qty 2

## 2020-10-28 NOTE — ED Triage Notes (Signed)
Pt from home and started throwing up on Friday for approx 15 hours. Pt stated that she had coffee ground emesis followed by dry heaves.   Pt states that she has not vomited since Saturday morning but has continued to be nauseas.  Pt also complains of  RUQ pain the entire time. Pt stated just took Zofran PO approx 1 hour ago

## 2020-10-28 NOTE — ED Triage Notes (Signed)
N/V since Friday. Was here earlier for same

## 2020-10-28 NOTE — ED Provider Notes (Signed)
[facility] Provider Note: [name], MD, FACEP  CSN: [id number] MRN: [id number] ARRIVAL: 10/28/20 at 2249 ROOM: [place]  Abdominal Pain   HISTORY OF PRESENT ILLNESS  10/28/20 11:32 PM Natalie Joseph is a 40 y.o. female who has had cyclic vomiting syndrome since she was 40 years old.  She is here with an episode of vomiting that began 4 days ago.  Symptoms have been severe.  She vomited 15 times on the first day alone.  Her emesis is now bilious but she has had some episodes of coffee-ground looking material as well.  She has taken Zofran multiple doses without relief of her nausea.  She is also having pain which is in an L shape in her right lower quadrant.  She rates this is a 10 out of 10 and it is worse with movement or palpation.  This pain is not characteristic of her cyclic vomiting attacks.  She denies cannabis use.   Past Medical History:  Diagnosis Date  . Adjustment disorder with mixed anxiety and depressed mood   . Cervical radiculopathy   . Cyclic vomiting syndrome   . Gastritis   . Myalgia   . Panic attack   . Renal disorder     Past Surgical History:  Procedure Laterality Date  . ESOPHAGOGASTRODUODENOSCOPY    . HEMORRHOID SURGERY    . WISDOM TOOTH EXTRACTION      Family History  Problem Relation Age of Onset  . Hypertension Mother   . Hyperlipidemia Mother   . Kidney disease Father   . Hypertension Father   . Depression Father   . Diabetes Father   . Heart disease Father   . Stroke Maternal Grandmother   . Cancer Maternal Grandfather        lung  . Stroke Paternal Grandmother   . Heart disease Paternal Grandfather   . Cancer Paternal Aunt        breast  . Diabetes Paternal Aunt   . Breast cancer Paternal Aunt     Social History   Tobacco Use  . Smoking status: Current Every Day Smoker    Packs/day: 2.00    Years: 17.00    Pack years: 34.00    Types: Cigarettes  . Smokeless tobacco: Never  Used  Vaping Use  . Vaping Use: Never used  Substance Use Topics  . Alcohol use: No  . Drug use: No    Prior to Admission medications   Medication Sig Start Date End Date Taking? Authorizing Provider  cyclobenzaprine (FLEXERIL) 10 MG tablet Take 1 tablet (10 mg total) by mouth 3 (three) times daily as needed for muscle spasms. 10/29/20  Yes Donnalyn Juran, MD  busPIRone (BUSPAR) 10 MG tablet Take 1 tablet (10 mg total) by mouth 3 (three) times daily as needed. 09/16/20   Leftwich-Kirby, Kathie Dike, CNM  cyclobenzaprine (FLEXERIL) 10 MG tablet Take 10 mg by mouth 3 (three) times daily. 09/13/20   [provider]  esomeprazole (NEXIUM) 40 MG capsule Take 40 mg by mouth 2 (two) times daily. 04/27/20   [provider]  hydrOXYzine (VISTARIL) 25 MG capsule Take 1-2 capsules (25-50 mg total) by mouth 3 (three) times daily as needed. 09/16/20   Leftwich-Kirby, Kathie Dike, CNM  metoCLOPramide (REGLAN) 10 MG tablet Take 1 tablet (10 mg total) by mouth every 6 (six) hours as needed for nausea (nausea/headache). 02/15/20   Palumbo, April, MD  potassium iodide (SSKI) 1 GM/ML solution Take by  mouth.    [provider]  propranolol ER (INDERAL LA) 80 MG 24 hr capsule Take 80 mg by mouth daily. 05/18/18   [provider]  zonisamide (ZONEGRAN) 100 MG capsule 100 mg 4 (four) times daily as needed. 01/18/19   [provider]  albuterol (ACCUNEB) 0.63 MG/3ML nebulizer solution Inhale into the lungs. 09/16/16 03/24/20  [provider]  norgestimate-ethinyl estradiol (ORTHO-CYCLEN,SPRINTEC,PREVIFEM) 0.25-35 MG-MCG tablet Take 1 tablet by mouth daily. 06/15/17 05/26/19  Kandis Cocking A, CNM  potassium chloride (KLOR-CON) 20 MEQ packet Take 20 mEq by mouth 2 (two) times daily. 05/26/19 03/24/20  Andric Kerce, MD    Allergies Septra ds [sulfamethoxazole w/trimethoprim (co-trimoxazole)], Sulfa antibiotics, Cheese, Corn-containing products, Eggs or egg-derived products, Latex,  Milk-related compounds, Peanut-containing drug products, and Shellfish allergy   REVIEW OF SYSTEMS  Negative except as noted here or in the History of Present Illness.   PHYSICAL EXAMINATION  Initial Vital Signs Blood pressure (!) 155/98, pulse (!) 107, temperature 98.2 F (36.8 C), temperature source Oral, resp. rate 20, last menstrual period 10/03/2020, SpO2 100 %.  Examination General: Well-developed, well-nourished female in no acute distress; appearance consistent with age of record HENT: normocephalic; atraumatic Eyes: pupils equal, round and reactive to light; extraocular muscles intact Neck: supple Heart: regular rate and rhythm Lungs: clear to auscultation bilaterally Abdomen: soft; nondistended; right lower quadrant tenderness; no masses or hepatosplenomegaly; bowel sounds present Extremities: No deformity; full range of motion; pulses normal Neurologic: Awake, alert and oriented; motor function intact in all extremities and symmetric; no facial droop Skin: Warm and dry Psychiatric: Normal mood and affect   RESULTS  Summary of this visit's results, reviewed and interpreted by myself:   EKG Interpretation  Date/Time:    Ventricular Rate:    PR Interval:    QRS Duration:   QT Interval:    QTC Calculation:   R Axis:     Text Interpretation:        Laboratory Studies: Results for orders placed or performed during the hospital encounter of 10/28/20 (from the past 24 hour(s))  CBC with Differential/Platelet     Status: Abnormal   Collection Time: 10/29/20 12:04 AM  Result Value Ref Range   WBC 19.1 (H) 4.0 - 10.5 K/uL   RBC 5.01 3.87 - 5.11 MIL/uL   Hemoglobin 16.1 (H) 12.0 - 15.0 g/dL   HCT 46.3 (H) 36.0 - 46.0 %   MCV 92.4 80.0 - 100.0 fL   MCH 32.1 26.0 - 34.0 pg   MCHC 34.8 30.0 - 36.0 g/dL   RDW 13.5 11.5 - 15.5 %   Platelets 434 (H) 150 - 400 K/uL   nRBC 0.0 0.0 - 0.2 %   Neutrophils Relative % 77 %   Neutro Abs 14.8 (H) 1.7 - 7.7 K/uL    Lymphocytes Relative 15 %   Lymphs Abs 2.8 0.7 - 4.0 K/uL   Monocytes Relative 6 %   Monocytes Absolute 1.2 (H) 0.1 - 1.0 K/uL   Eosinophils Relative 1 %   Eosinophils Absolute 0.2 0.0 - 0.5 K/uL   Basophils Relative 1 %   Basophils Absolute 0.1 0.0 - 0.1 K/uL   Immature Granulocytes 0 %   Abs Immature Granulocytes 0.08 (H) 0.00 - 0.07 K/uL  Comprehensive metabolic panel     Status: Abnormal   Collection Time: 10/29/20 12:04 AM  Result Value Ref Range   Sodium 133 (L) 135 - 145 mmol/L   Potassium 3.5 3.5 - 5.1 mmol/L  Chloride 93 (L) 98 - 111 mmol/L   CO2 28 22 - 32 mmol/L   Glucose, Bld 103 (H) 70 - 99 mg/dL   BUN 11 6 - 20 mg/dL   Creatinine, Ser 0.69 0.44 - 1.00 mg/dL   Calcium 9.9 8.9 - 10.3 mg/dL   Total Protein 7.9 6.5 - 8.1 g/dL   Albumin 4.9 3.5 - 5.0 g/dL   AST 12 (L) 15 - 41 U/L   ALT 9 0 - 44 U/L   Alkaline Phosphatase 90 38 - 126 U/L   Total Bilirubin 1.2 0.3 - 1.2 mg/dL   GFR, Estimated >60 >60 mL/min   Anion gap 12 5 - 15  Lipase, blood     Status: None   Collection Time: 10/29/20 12:04 AM  Result Value Ref Range   Lipase 28 11 - 51 U/L  Urinalysis, Routine w reflex microscopic Urine, Clean Catch     Status: None   Collection Time: 10/29/20  1:06 AM  Result Value Ref Range   Color, Urine YELLOW YELLOW   APPearance CLEAR CLEAR   Specific Gravity, Urine 1.010 1.005 - 1.030   pH 7.0 5.0 - 8.0   Glucose, UA NEGATIVE NEGATIVE mg/dL   Hgb urine dipstick NEGATIVE NEGATIVE   Bilirubin Urine NEGATIVE NEGATIVE   Ketones, ur NEGATIVE NEGATIVE mg/dL   Protein, ur NEGATIVE NEGATIVE mg/dL   Nitrite NEGATIVE NEGATIVE   Leukocytes,Ua NEGATIVE NEGATIVE  Pregnancy, urine     Status: None   Collection Time: 10/29/20  1:06 AM  Result Value Ref Range   Preg Test, Ur NEGATIVE NEGATIVE  Rapid urine drug screen (hospital performed)     Status: Abnormal   Collection Time: 10/29/20  1:06 AM  Result Value Ref Range   Opiates NONE DETECTED NONE DETECTED   Cocaine NONE  DETECTED NONE DETECTED   Benzodiazepines NONE DETECTED NONE DETECTED   Amphetamines NONE DETECTED NONE DETECTED   Tetrahydrocannabinol POSITIVE (A) NONE DETECTED   Barbiturates NONE DETECTED NONE DETECTED   Imaging Studies: CT ABDOMEN PELVIS W CONTRAST  Result Date: 10/29/2020 CLINICAL DATA:  Coffee ground emesis. EXAM: CT ABDOMEN AND PELVIS WITH CONTRAST TECHNIQUE: Multidetector CT imaging of the abdomen and pelvis was performed using the standard protocol following bolus administration of intravenous contrast. CONTRAST:  155mL OMNIPAQUE IOHEXOL 300 MG/ML  SOLN COMPARISON:  February 21, 2019 FINDINGS: Lower chest: No acute abnormality. Hepatobiliary: A 3 mm focus of parenchymal low attenuation is seen within the lateral aspect of the right lobe of the liver. No gallstones, gallbladder wall thickening, or biliary dilatation. Pancreas: Unremarkable. No pancreatic ductal dilatation or surrounding inflammatory changes. Spleen: Normal in size without focal abnormality. Adrenals/Urinary Tract: Adrenal glands are unremarkable. Kidneys are normal, without obstructing renal calculi, focal lesion, or hydronephrosis. Multiple 2 mm nonobstructing calculi are seen within both kidneys. Bladder is unremarkable. Stomach/Bowel: Stomach is within normal limits. Appendix appears normal. No evidence of bowel wall thickening, distention, or inflammatory changes. Vascular/Lymphatic: No significant vascular findings are present. No enlarged abdominal or pelvic lymph nodes. Reproductive: Uterus and bilateral adnexa are unremarkable. Other: No abdominal wall hernia or abnormality. No abdominopelvic ascites. Musculoskeletal: No acute or significant osseous findings. IMPRESSION: 1. Bilateral 2 mm nonobstructing renal calculi. 2. Small hepatic cyst versus hemangioma. Electronically Signed   By: Virgina Norfolk M.D.   On: 10/29/2020 02:17    ED COURSE and MDM  Nursing notes, initial and subsequent vitals signs, including pulse  oximetry, reviewed and interpreted by myself.  Vitals:  10/28/20 2255 10/29/20 0012  BP: (!) 155/98 128/85  Pulse: (!) 107 88  Resp: 20 16  Temp: 98.2 F (36.8 C)   TempSrc: Oral   SpO2: 100% 99%   Medications  lactated ringers bolus 1,000 mL (0 mLs Intravenous Stopped 10/29/20 0107)  metoCLOPramide (REGLAN) injection 10 mg (10 mg Intravenous Given 10/29/20 0008)  fentaNYL (SUBLIMAZE) injection 100 mcg (100 mcg Intravenous Given 10/29/20 0008)  iohexol (OMNIPAQUE) 9 MG/ML oral solution 500 mL (500 mLs Oral Contrast Given 10/29/20 0106)  iohexol (OMNIPAQUE) 300 MG/ML solution 100 mL (100 mLs Intravenous Contrast Given 10/29/20 0206)   2:35 AM Patient feels back to normal at this time.  Her CT was reassuring but she was advised of the nonobstructing renal stones.   PROCEDURES  Procedures   ED DIAGNOSES     ICD-10-CM   1. Cyclic vomiting syndrome  R11.15        Shanon Rosser, MD 10/29/20 [phone]

## 2020-10-29 ENCOUNTER — Emergency Department (HOSPITAL_BASED_OUTPATIENT_CLINIC_OR_DEPARTMENT_OTHER): Payer: Medicaid Other

## 2020-10-29 LAB — COMPREHENSIVE METABOLIC PANEL
ALT: 9 U/L (ref 0–44)
AST: 12 U/L — ABNORMAL LOW (ref 15–41)
Albumin: 4.9 g/dL (ref 3.5–5.0)
Alkaline Phosphatase: 90 U/L (ref 38–126)
Anion gap: 12 (ref 5–15)
BUN: 11 mg/dL (ref 6–20)
CO2: 28 mmol/L (ref 22–32)
Calcium: 9.9 mg/dL (ref 8.9–10.3)
Chloride: 93 mmol/L — ABNORMAL LOW (ref 98–111)
Creatinine, Ser: 0.69 mg/dL (ref 0.44–1.00)
GFR, Estimated: >60 mL/min (ref >60)
Glucose, Bld: 103 mg/dL — ABNORMAL HIGH (ref 70–99)
Potassium: 3.5 mmol/L (ref 3.5–5.1)
Sodium: 133 mmol/L — ABNORMAL LOW (ref 135–145)
Total Bilirubin: 1.2 mg/dL (ref 0.3–1.2)
Total Protein: 7.9 g/dL (ref 6.5–8.1)

## 2020-10-29 LAB — CBC WITH DIFFERENTIAL/PLATELET
Abs Immature Granulocytes: 0.08 10*3/uL — ABNORMAL HIGH (ref 0.00–0.07)
Basophils Absolute: 0.1 10*3/uL (ref 0.0–0.1)
Basophils Relative: 1 %
Eosinophils Absolute: 0.2 10*3/uL (ref 0.0–0.5)
Eosinophils Relative: 1 %
HCT: 46.3 % — ABNORMAL HIGH (ref 36.0–46.0)
Hemoglobin: 16.1 g/dL — ABNORMAL HIGH (ref 12.0–15.0)
Immature Granulocytes: 0 %
Lymphocytes Relative: 15 %
Lymphs Abs: 2.8 10*3/uL (ref 0.7–4.0)
MCH: 32.1 pg (ref 26.0–34.0)
MCHC: 34.8 g/dL (ref 30.0–36.0)
MCV: 92.4 fL (ref 80.0–100.0)
Monocytes Absolute: 1.2 10*3/uL — ABNORMAL HIGH (ref 0.1–1.0)
Monocytes Relative: 6 %
Neutro Abs: 14.8 10*3/uL — ABNORMAL HIGH (ref 1.7–7.7)
Neutrophils Relative %: 77 %
Platelets: 434 10*3/uL — ABNORMAL HIGH (ref 150–400)
RBC: 5.01 MIL/uL (ref 3.87–5.11)
RDW: 13.5 % (ref 11.5–15.5)
WBC: 19.1 10*3/uL — ABNORMAL HIGH (ref 4.0–10.5)
nRBC: 0 % (ref 0.0–0.2)

## 2020-10-29 LAB — URINALYSIS, ROUTINE W REFLEX MICROSCOPIC
Bilirubin Urine: NEGATIVE
Glucose, UA: NEGATIVE mg/dL
Hgb urine dipstick: NEGATIVE
Ketones, ur: NEGATIVE mg/dL
Leukocytes,Ua: NEGATIVE
Nitrite: NEGATIVE
Protein, ur: NEGATIVE mg/dL
Specific Gravity, Urine: 1.01 (ref 1.005–1.030)
pH: 7 (ref 5.0–8.0)

## 2020-10-29 LAB — RAPID URINE DRUG SCREEN, HOSP PERFORMED
Amphetamines: NOT DETECTED
Barbiturates: NOT DETECTED
Benzodiazepines: NOT DETECTED
Cocaine: NOT DETECTED
Opiates: NOT DETECTED
Tetrahydrocannabinol: POSITIVE — AB

## 2020-10-29 LAB — PREGNANCY, URINE: Preg Test, Ur: NEGATIVE

## 2020-10-29 LAB — LIPASE, BLOOD: Lipase: 28 U/L (ref 11–51)

## 2020-10-29 MED ORDER — CYCLOBENZAPRINE HCL 10 MG PO TABS: 10.0000 mg | ORAL_TABLET | Freq: Three times a day (TID) | ORAL | 0 refills | Status: DC | PRN

## 2020-10-29 MED ORDER — IOHEXOL 300 MG/ML  SOLN
100.0000 mL | Freq: Once | INTRAMUSCULAR | Status: AC | PRN
  Administered 2020-10-29: 100 mL via INTRAVENOUS

## 2020-11-14 ENCOUNTER — Other Ambulatory Visit: Payer: Self-pay

## 2020-11-14 ENCOUNTER — Ambulatory Visit (INDEPENDENT_AMBULATORY_CARE_PROVIDER_SITE_OTHER): Payer: Medicaid Other

## 2020-11-14 ENCOUNTER — Other Ambulatory Visit (HOSPITAL_COMMUNITY)
Admission: RE | Admit: 2020-11-14 | Discharge: 2020-11-14 | Disposition: A | Discharge status: Home / Self Care | Payer: Medicaid Other | Source: Ambulatory Visit | Attending: Obstetrics | Admitting: Obstetrics

## 2020-11-14 DIAGNOSIS — Z113 Encounter for screening for infections with a predominantly sexual mode of transmission: Secondary | ICD-10-CM | POA: Insufficient documentation
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
## 2020-11-15 LAB — CERVICOVAGINAL ANCILLARY ONLY
Bacterial Vaginitis (gardnerella): NEGATIVE
Candida Glabrata: NEGATIVE
Candida Vaginitis: NEGATIVE
Chlamydia: NEGATIVE
Comment: NEGATIVE
Comment: NEGATIVE
Comment: NEGATIVE
Comment: NEGATIVE
Comment: NEGATIVE
Comment: NORMAL
Neisseria Gonorrhea: NEGATIVE
Trichomonas: NEGATIVE

## 2020-11-15 LAB — RPR+HBSAG+HCVAB+...
HIV Screen 4th Generation wRfx: NONREACTIVE
Hep C Virus Ab: <0.1 s/co ratio (ref 0.0–0.9)
Hepatitis B Surface Ag: NEGATIVE
RPR Ser Ql: NONREACTIVE

## 2020-11-18 ENCOUNTER — Ambulatory Visit: Payer: Medicaid Other | Admitting: Advanced Practice Midwife

## 2020-12-06 ENCOUNTER — Emergency Department (HOSPITAL_BASED_OUTPATIENT_CLINIC_OR_DEPARTMENT_OTHER)
Admission: EM | Admit: 2020-12-06 | Discharge: 2020-12-07 | Disposition: A | Discharge status: Home / Self Care | Payer: Medicaid Other | Attending: Emergency Medicine | Admitting: Emergency Medicine

## 2020-12-06 ENCOUNTER — Encounter (HOSPITAL_BASED_OUTPATIENT_CLINIC_OR_DEPARTMENT_OTHER): Payer: Self-pay | Admitting: Emergency Medicine

## 2020-12-06 ENCOUNTER — Other Ambulatory Visit: Payer: Self-pay

## 2020-12-06 DIAGNOSIS — R102 Pelvic and perineal pain: Secondary | ICD-10-CM | POA: Insufficient documentation

## 2020-12-06 DIAGNOSIS — Z91040 Latex allergy status: Secondary | ICD-10-CM | POA: Diagnosis not present

## 2020-12-06 DIAGNOSIS — F17210 Nicotine dependence, cigarettes, uncomplicated: Secondary | ICD-10-CM | POA: Insufficient documentation

## 2020-12-06 DIAGNOSIS — Z91010 Allergy to peanuts: Secondary | ICD-10-CM | POA: Diagnosis not present

## 2020-12-06 DIAGNOSIS — T7421XA Adult sexual abuse, confirmed, initial encounter: Secondary | ICD-10-CM | POA: Diagnosis not present
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
## 2020-12-07 LAB — WET PREP, GENITAL
Sperm: NONE SEEN
Trich, Wet Prep: NONE SEEN
Yeast Wet Prep HPF POC: NONE SEEN

## 2020-12-07 LAB — HIV ANTIBODY (ROUTINE TESTING W REFLEX): HIV Screen 4th Generation wRfx: NONREACTIVE

## 2020-12-07 LAB — RPR: RPR Ser Ql: NONREACTIVE

## 2020-12-07 MED ORDER — CEFTRIAXONE SODIUM 500 MG IJ SOLR
500.0000 mg | Freq: Once | INTRAMUSCULAR | Status: AC
  Administered 2020-12-07: 500 mg via INTRAMUSCULAR
  Filled 2020-12-07: qty 500

## 2020-12-07 MED ORDER — AZITHROMYCIN 1 G PO PACK
1.0000 g | PACK | Freq: Once | ORAL | Status: AC
  Administered 2020-12-07: 1 g via ORAL
  Filled 2020-12-07: qty 1

## 2020-12-07 MED ORDER — METRONIDAZOLE 500 MG PO TABS: 500.0000 mg | ORAL_TABLET | Freq: Two times a day (BID) | ORAL | 0 refills | Status: DC
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
## 2020-12-08 LAB — GC/CHLAMYDIA PROBE AMP (~~LOC~~) NOT AT ARMC
Chlamydia: NEGATIVE
Comment: NEGATIVE
Comment: NORMAL
Neisseria Gonorrhea: NEGATIVE

## 2020-12-10 ENCOUNTER — Ambulatory Visit: Payer: Medicaid Other | Admitting: Advanced Practice Midwife

## 2020-12-16 ENCOUNTER — Telehealth: Payer: Medicaid Other | Admitting: Advanced Practice Midwife

## 2020-12-31 ENCOUNTER — Ambulatory Visit: Payer: Medicaid Other | Admitting: Advanced Practice Midwife

## 2020-12-31 ENCOUNTER — Encounter: Payer: Self-pay | Admitting: Advanced Practice Midwife

## 2020-12-31 ENCOUNTER — Other Ambulatory Visit (HOSPITAL_COMMUNITY)
Admission: RE | Admit: 2020-12-31 | Discharge: 2020-12-31 | Disposition: A | Discharge status: Home / Self Care | Payer: Medicaid Other | Source: Ambulatory Visit | Attending: Advanced Practice Midwife | Admitting: Advanced Practice Midwife

## 2020-12-31 ENCOUNTER — Other Ambulatory Visit: Payer: Self-pay

## 2020-12-31 VITALS — BP 124/72 | HR 102 | Resp 16 | Ht 64.0 in | Wt 130.0 lb

## 2020-12-31 DIAGNOSIS — B3731 Acute candidiasis of vulva and vagina: Secondary | ICD-10-CM

## 2020-12-31 DIAGNOSIS — T7421XA Adult sexual abuse, confirmed, initial encounter: Secondary | ICD-10-CM

## 2020-12-31 DIAGNOSIS — Z113 Encounter for screening for infections with a predominantly sexual mode of transmission: Secondary | ICD-10-CM | POA: Insufficient documentation

## 2020-12-31 DIAGNOSIS — B373 Candidiasis of vulva and vagina: Secondary | ICD-10-CM

## 2020-12-31 DIAGNOSIS — L249 Irritant contact dermatitis, unspecified cause: Secondary | ICD-10-CM | POA: Diagnosis not present

## 2020-12-31 DIAGNOSIS — F411 Generalized anxiety disorder: Secondary | ICD-10-CM

## 2020-12-31 MED ORDER — FLUCONAZOLE 150 MG PO TABS: 150.0000 mg | ORAL_TABLET | Freq: Once | ORAL | 1 refills | Status: AC
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
## 2020-12-31 NOTE — Patient Instructions (Signed)
Contact Dermatitis Dermatitis is redness, soreness, and swelling (inflammation) of the skin. Contact dermatitis is a reaction to certain substances that touch the skin. Many different substances can cause contact dermatitis. There are two types of contact dermatitis:  Irritant contact dermatitis. This type is caused by something that irritates your skin, such as having dry hands from washing them too often with soap. This type does not require previous exposure to the substance for a reaction to occur. This is the most common type.  Allergic contact dermatitis. This type is caused by a substance that you are allergic to, such as poison ivy. This type occurs when you have been exposed to the substance (allergen) and develop a sensitivity to it. Dermatitis may develop soon after your first exposure to the allergen, or it may not develop until the next time you are exposed and every time thereafter. What are the causes? Irritant contact dermatitis is most commonly caused by exposure to:  Makeup.  Soaps.  Detergents.  Bleaches.  Acids.  Metal salts, such as nickel. Allergic contact dermatitis is most commonly caused by exposure to:  Poisonous plants.  Chemicals.  Jewelry.  Latex.  Medicines.  Preservatives in products, such as clothing. What increases the risk? You are more likely to develop this condition if you have:  A job that exposes you to irritants or allergens.  Certain medical conditions, such as asthma or eczema. What are the signs or symptoms? Symptoms of this condition may occur on your body anywhere the irritant has touched you or is touched by you.  Symptoms include: ? Dryness or flaking. ? Redness. ? Cracks. ? Itching. ? Pain or a burning feeling. ? Blisters. ? Drainage of small amounts of blood or clear fluid from skin cracks. With allergic contact dermatitis, there may also be swelling in areas such as the eyelids, mouth, or genitals.   How is this  diagnosed? This condition is diagnosed with a medical history and physical exam.  A patch skin test may be performed to help determine the cause.  If the condition is related to your job, you may need to see an occupational medicine specialist. How is this treated? This condition is treated by checking for the cause of the reaction and protecting your skin from further contact. Treatment may also include:  Steroid creams or ointments. Oral steroid medicines may be needed in more severe cases.  Antibiotic medicines or antibacterial ointments, if a skin infection is present.  Antihistamine lotion or an antihistamine taken by mouth to ease itching.  A bandage (dressing). Follow these instructions at home: Skin care  Moisturize your skin as needed.  Apply cool compresses to the affected areas.  Try applying baking soda paste to your skin. Stir water into baking soda until it reaches a paste-like consistency.  Do not scratch your skin, and avoid friction to the affected area.  Avoid the use of soaps, perfumes, and dyes. Medicines  Take or apply over-the-counter and prescription medicines only as told by your health care provider.  If you were prescribed an antibiotic medicine, take or apply the antibiotic as told by your health care provider. Do not stop using the antibiotic even if your condition improves. Bathing  Try taking a bath with: ? Epsom salts. Follow the instructions on the packaging. You can get these at your local pharmacy or grocery store. ? Baking soda. Pour a small amount into the bath as directed by your health care provider. ? Colloidal oatmeal. Follow the instructions   on the packaging. You can get this at your local pharmacy or grocery store.  Bathe less frequently, such as every other day.  Bathe in lukewarm water. Avoid using hot water. Bandage care  If you were given a bandage (dressing), change it as told by your health care provider.  Wash your hands  with soap and water before and after you change your dressing. If soap and water are not available, use hand sanitizer. General instructions  Avoid the substance that caused your reaction. If you do not know what caused it, keep a journal to try to track what caused it. Write down: ? What you eat. ? What cosmetic products you use. ? What you drink. ? What you wear in the affected area. This includes jewelry.  Check the affected areas every day for signs of infection. Check for: ? More redness, swelling, or pain. ? More fluid or blood. ? Warmth. ? Pus or a bad smell.  Keep all follow-up visits as told by your health care provider. This is important. Contact a health care provider if:  Your condition does not improve with treatment.  Your condition gets worse.  You have signs of infection such as swelling, tenderness, redness, soreness, or warmth in the affected area.  You have a fever.  You have new symptoms. Get help right away if:  You have a severe headache, neck pain, or neck stiffness.  You vomit.  You feel very sleepy.  You notice red streaks coming from the affected area.  Your bone or joint underneath the affected area becomes painful after the skin has healed.  The affected area turns darker.  You have difficulty breathing. Summary  Dermatitis is redness, soreness, and swelling (inflammation) of the skin. Contact dermatitis is a reaction to certain substances that touch the skin.  Symptoms of this condition may occur on your body anywhere the irritant has touched you or is touched by you.  This condition is treated by figuring out what caused the reaction and protecting your skin from further contact. Treatment may also include medicines and skin care.  Avoid the substance that caused your reaction. If you do not know what caused it, keep a journal to try to track what caused it.  Contact a health care provider if your condition gets worse or you have signs  of infection such as swelling, tenderness, redness, soreness, or warmth in the affected area. This information is not intended to replace advice given to you by your health care provider. Make sure you discuss any questions you have with your health care provider. Document Revised: 12/14/2018 Document Reviewed: 03/09/2018 Elsevier Patient Education  2021 [employer] Inc.  

## 2021-01-01 LAB — CERVICOVAGINAL ANCILLARY ONLY
Bacterial Vaginitis (gardnerella): NEGATIVE
Candida Glabrata: NEGATIVE
Candida Vaginitis: NEGATIVE
Chlamydia: NEGATIVE
Comment: NEGATIVE
Comment: NEGATIVE
Comment: NEGATIVE
Comment: NEGATIVE
Comment: NEGATIVE
Comment: NORMAL
Neisseria Gonorrhea: NEGATIVE
Trichomonas: NEGATIVE

## 2021-01-01 LAB — HIV ANTIBODY (ROUTINE TESTING W REFLEX): HIV 1&2 Ab, 4th Generation: NONREACTIVE

## 2021-01-01 LAB — HEPATITIS C ANTIBODY
Hepatitis C Ab: NONREACTIVE
SIGNAL TO CUT-OFF: 0 (ref <1.00)

## 2021-01-01 LAB — RPR: RPR Ser Ql: NONREACTIVE

## 2021-01-01 LAB — HEPATITIS B SURFACE ANTIGEN: Hepatitis B Surface Ag: NONREACTIVE

## 2021-01-02 LAB — HERPES SIMPLEX VIRUS CULTURE
MICRO NUMBER:: 11817456
SPECIMEN QUALITY:: ADEQUATE

## 2021-01-06 ENCOUNTER — Telehealth: Payer: Self-pay | Admitting: *Deleted

## 2021-01-06 NOTE — Telephone Encounter (Signed)
Pt notified via phone that her STD panel and swabs were negative.  I did speak with pt and verified DOB.

## 2021-01-06 NOTE — Telephone Encounter (Signed)
-----   Message from Elvera Maria, CNM sent at 01/03/2021  2:46 PM EDT ----- I asked about MyChart when this pt was in the office but she does not seem to have an account.  Can we call her to tell her all her STD results, including her herpes swab are negative?  I will see her in the office in 2 months or she can call us sooner. Thank you.

## 2021-01-21 ENCOUNTER — Encounter: Payer: Self-pay | Admitting: Advanced Practice Midwife

## 2021-01-21 ENCOUNTER — Ambulatory Visit: Payer: Medicaid Other | Admitting: Advanced Practice Midwife

## 2021-01-21 ENCOUNTER — Other Ambulatory Visit: Payer: Self-pay

## 2021-01-21 VITALS — BP 135/82 | HR 103 | Ht 64.0 in | Wt 133.0 lb

## 2021-01-21 DIAGNOSIS — N911 Secondary amenorrhea: Secondary | ICD-10-CM

## 2021-01-21 DIAGNOSIS — N912 Amenorrhea, unspecified: Secondary | ICD-10-CM

## 2021-01-21 DIAGNOSIS — L739 Follicular disorder, unspecified: Secondary | ICD-10-CM | POA: Diagnosis not present

## 2021-01-21 DIAGNOSIS — Z3169 Encounter for other general counseling and advice on procreation: Secondary | ICD-10-CM | POA: Diagnosis not present

## 2021-01-21 NOTE — Progress Notes (Signed)
  GYNECOLOGY PROGRESS NOTE  History:  40 y.o. G8P1070 presents to [place] office today for problem gyn visit. She reports she missed her menses last month but did start her period this week and she wants to make sure vaginal bumps treated at last visit are gone. She denies h/a, dizziness, shortness of breath, n/v, or fever/chills.    The following portions of the patient's history were reviewed and updated as appropriate: allergies, current medications, past family history, past medical history, past social history, past surgical history and problem list. Last pap smear on 07/04/19 was normal, neg HRHPV.  Review of Systems:  Pertinent items are noted in HPI.   Objective:  Physical Exam Blood pressure 135/82, pulse (!) 103, height 5\' 4"  (1.626 m), weight 133 lb (60.3 kg), last menstrual period 01/17/2021. VS reviewed, nursing note reviewed,  Constitutional: well developed, well nourished, no distress HEENT: normocephalic CV: normal rate Pulm/chest wall: normal effort Breast Exam: deferred Abdomen: soft Neuro: alert and oriented x 3 Skin: warm, dry Psych: affect normal Pelvic exam: Cervix pink, visually closed, without lesion, scant white creamy discharge, vaginal walls and external genitalia normal Bimanual exam: Cervix 0/long/high, firm, anterior, neg CMT, uterus nontender, nonenlarged, adnexa without tenderness, enlargement, or mass  Assessment & Plan:  1. Amenorrhea   2. Secondary amenorrhea --No menses last month, onset of painful heavy menses this week.  Likely anovulatory cycle. Discussed reasons this can happen with pt including stress, age, etc.  Pt states understanding. --F/U if periods continue but be irregular  3. Folliculitis --Pt concerned about some small bumps in vaginal area at last visit, appear to be simple folliculitis/possible topical dermatitis from products placed in area but no evidence of abnormal lesion or infection. Three small areas were treated with  TCA at pt request.   --Today, no evidence of these treated areas and no other abnormal skin areas noted.   --Reassurance provided that there is no evidence of infection or abnormality.   4. Encounter for preconception consultation --Pt expressed possible interest in becoming pregnant.  Reviewed risks of pregnancy over age 33, both maternal and fetal, but discussed that most women can achieve pregnancy and have healthy baby at any age.  Pt is unsure and is also enjoying life with her daughter at age 54 and almost out of the house.  Pt to consider.  Folic acid/PNV recommended prior to becoming pregnant.   Fatima Blank, CNM 1:42 PM

## 2021-01-21 NOTE — Progress Notes (Signed)
Pt missed period in April LMP 01/17/21- heavy and painful

## 2021-02-25 ENCOUNTER — Ambulatory Visit: Payer: Medicaid Other | Admitting: Advanced Practice Midwife

## 2021-02-25 NOTE — Progress Notes (Deleted)
  GYNECOLOGY PROGRESS NOTE  History:  40 y.o. G8P1070 presents to [facility] *** office today for problem gyn visit. She reports *****.  She denies h/a, dizziness, shortness of breath, n/v, or fever/chills.    The following portions of the patient's history were reviewed and updated as appropriate: allergies, current medications, past family history, past medical history, past social history, past surgical history and problem list. Last pap smear on *** was normal, *** HRHPV.  Health Maintenance Due  Topic Date Due   COVID-19 Vaccine (1) Never done   Pneumococcal Vaccine 75-49 Years old (1 - PCV) Never done     Review of Systems:  Pertinent items are noted in HPI.   Objective:  Physical Exam There were no vitals taken for this visit. VS reviewed, nursing note reviewed,  Constitutional: well developed, well nourished, no distress HEENT: normocephalic CV: normal rate Pulm/chest wall: normal effort Breast Exam: deferred Abdomen: soft Neuro: alert and oriented x 3 Skin: warm, dry Psych: affect normal Pelvic exam: Cervix pink, visually closed, without lesion, scant white creamy discharge, vaginal walls and external genitalia normal Bimanual exam: Cervix 0/long/high, firm, anterior, neg CMT, uterus nontender, nonenlarged, adnexa without tenderness, enlargement, or mass  Assessment & Plan:  There are no diagnoses linked to this encounter.  Fatima Blank, CNM 8:21 AM

## 2021-04-03 ENCOUNTER — Telehealth: Payer: Self-pay | Admitting: *Deleted

## 2021-04-03 NOTE — Telephone Encounter (Signed)
Left patient an urgent message with appointment day and time with Lattie Haw on 04/15/2021 at 1:50 with an 1:35 PM arrival time.

## 2021-04-14 ENCOUNTER — Encounter: Payer: Self-pay | Admitting: Obstetrics & Gynecology

## 2021-04-14 ENCOUNTER — Other Ambulatory Visit (HOSPITAL_COMMUNITY)
Admission: RE | Admit: 2021-04-14 | Discharge: 2021-04-14 | Disposition: A | Discharge status: Home / Self Care | Payer: Medicaid Other | Source: Ambulatory Visit | Attending: Obstetrics & Gynecology | Admitting: Obstetrics & Gynecology

## 2021-04-14 ENCOUNTER — Other Ambulatory Visit: Payer: Self-pay

## 2021-04-14 ENCOUNTER — Ambulatory Visit: Payer: Medicaid Other | Admitting: Obstetrics & Gynecology

## 2021-04-14 VITALS — BP 124/63 | HR 107 | Ht 64.0 in | Wt 128.0 lb

## 2021-04-14 DIAGNOSIS — M7989 Other specified soft tissue disorders: Secondary | ICD-10-CM | POA: Insufficient documentation

## 2021-04-14 DIAGNOSIS — M79605 Pain in left leg: Secondary | ICD-10-CM

## 2021-04-14 DIAGNOSIS — B373 Candidiasis of vulva and vagina: Secondary | ICD-10-CM | POA: Diagnosis not present

## 2021-04-14 DIAGNOSIS — M79604 Pain in right leg: Secondary | ICD-10-CM

## 2021-04-14 DIAGNOSIS — B3731 Acute candidiasis of vulva and vagina: Secondary | ICD-10-CM

## 2021-04-14 DIAGNOSIS — Z202 Contact with and (suspected) exposure to infections with a predominantly sexual mode of transmission: Secondary | ICD-10-CM

## 2021-04-14 MED ORDER — DOXYCYCLINE HYCLATE 100 MG PO CAPS: 100.0000 mg | ORAL_CAPSULE | Freq: Two times a day (BID) | ORAL | 0 refills | Status: DC

## 2021-04-14 MED ORDER — FLUCONAZOLE 150 MG PO TABS: 150.0000 mg | ORAL_TABLET | Freq: Once | ORAL | 0 refills | Status: AC
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
## 2021-04-15 ENCOUNTER — Ambulatory Visit: Payer: Medicaid Other | Admitting: Advanced Practice Midwife

## 2021-04-15 LAB — COMPREHENSIVE METABOLIC PANEL
AG Ratio: 1.8 (calc) (ref 1.0–2.5)
ALT: 14 U/L (ref 6–29)
AST: 27 U/L (ref 10–30)
Albumin: 4.7 g/dL (ref 3.6–5.1)
Alkaline phosphatase (APISO): 106 U/L (ref 31–125)
BUN: 11 mg/dL (ref 7–25)
CO2: 26 mmol/L (ref 20–32)
Calcium: 9.6 mg/dL (ref 8.6–10.2)
Chloride: 100 mmol/L (ref 98–110)
Creat: 0.71 mg/dL (ref 0.50–0.99)
Globulin: 2.6 g/dL (calc) (ref 1.9–3.7)
Glucose, Bld: 103 mg/dL — ABNORMAL HIGH (ref 65–99)
Potassium: 3.3 mmol/L — ABNORMAL LOW (ref 3.5–5.3)
Sodium: 136 mmol/L (ref 135–146)
Total Bilirubin: 1.1 mg/dL (ref 0.2–1.2)
Total Protein: 7.3 g/dL (ref 6.1–8.1)

## 2021-04-15 LAB — CERVICOVAGINAL ANCILLARY ONLY
Chlamydia: NEGATIVE
Comment: NEGATIVE
Comment: NEGATIVE
Comment: NORMAL
Neisseria Gonorrhea: NEGATIVE
Trichomonas: NEGATIVE

## 2021-04-15 LAB — HEPATITIS B SURFACE ANTIGEN: Hepatitis B Surface Ag: NONREACTIVE

## 2021-04-15 LAB — HIV ANTIBODY (ROUTINE TESTING W REFLEX): HIV 1&2 Ab, 4th Generation: NONREACTIVE

## 2021-04-15 LAB — CBC
HCT: 42.7 % (ref 35.0–45.0)
Hemoglobin: 14.4 g/dL (ref 11.7–15.5)
MCH: 32.8 pg (ref 27.0–33.0)
MCHC: 33.7 g/dL (ref 32.0–36.0)
MCV: 97.3 fL (ref 80.0–100.0)
MPV: 8.9 fL (ref 7.5–12.5)
Platelets: 388 10*3/uL (ref 140–400)
RBC: 4.39 10*6/uL (ref 3.80–5.10)
RDW: 12.3 % (ref 11.0–15.0)
WBC: 16.1 10*3/uL — ABNORMAL HIGH (ref 3.8–10.8)

## 2021-04-15 LAB — RPR: RPR Ser Ql: NONREACTIVE

## 2021-04-15 LAB — D-DIMER, QUANTITATIVE: D-Dimer, Quant: 0.31 mcg/mL FEU (ref <0.50)

## 2021-04-15 LAB — HEPATITIS C ANTIBODY
Hepatitis C Ab: NONREACTIVE
SIGNAL TO CUT-OFF: 0.01 (ref <1.00)

## 2021-04-22 ENCOUNTER — Other Ambulatory Visit: Payer: Self-pay

## 2021-04-22 ENCOUNTER — Ambulatory Visit: Payer: Medicaid Other

## 2021-04-22 VITALS — BP 118/78 | HR 95 | Ht 64.0 in | Wt 124.0 lb

## 2021-04-22 DIAGNOSIS — N907 Vulvar cyst: Secondary | ICD-10-CM

## 2021-04-22 NOTE — Progress Notes (Signed)
   GYNECOLOGY PROGRESS NOTE  History:  40 y.o. G8P1070 presents to [facility] office today for follow up visit for labial cyst. She reports the cyst opened up and drained several  days ago and thinks that it is healed. She is still taking the antibiotics she was prescribed.    The following portions of the patient's history were reviewed and updated as appropriate: allergies, current medications, past family history, past medical history, past social history, past surgical history and problem list. Last pap smear on 06/2019 was normal, negative HRHPV.  Health Maintenance Due  Topic Date Due   COVID-19 Vaccine (1) Never done   Pneumococcal Vaccine 97-53 Years old (1 - PCV) Never done   INFLUENZA VACCINE  04/07/2021     Review of Systems:  Pertinent items are noted in HPI.   Objective:  Physical Exam Blood pressure 118/78, pulse 95, height '5\' 4"'$  (1.626 m), weight 124 lb (56.2 kg). VS reviewed, nursing note reviewed,  Constitutional: well developed, well nourished, no distress HEENT: normocephalic CV: normal rate Pulm/chest wall: normal effort Breast Exam: deferred Abdomen: soft Neuro: alert and oriented x 3 Skin: warm, dry Psych: affect normal Pelvic exam: External genitalia normal. Left labia; no evidence of a cyst, appears to be healed. No drainage, no redness, no tenderness  Assessment & Plan:  1. Labial cyst - appears well healed - finish antibiotic course as prescribed - follow up as needed    Renee Harder, CNM 04/22/21 1:57 PM

## 2021-04-24 ENCOUNTER — Other Ambulatory Visit: Payer: Self-pay

## 2021-04-24 ENCOUNTER — Emergency Department (HOSPITAL_BASED_OUTPATIENT_CLINIC_OR_DEPARTMENT_OTHER)
Admission: EM | Admit: 2021-04-24 | Discharge: 2021-04-24 | Disposition: A | Discharge status: Home / Self Care | Payer: Medicaid Other | Attending: Emergency Medicine | Admitting: Emergency Medicine

## 2021-04-24 ENCOUNTER — Encounter (HOSPITAL_BASED_OUTPATIENT_CLINIC_OR_DEPARTMENT_OTHER): Payer: Self-pay | Admitting: Emergency Medicine

## 2021-04-24 DIAGNOSIS — R1115 Cyclical vomiting syndrome unrelated to migraine: Secondary | ICD-10-CM | POA: Insufficient documentation

## 2021-04-24 DIAGNOSIS — E876 Hypokalemia: Secondary | ICD-10-CM | POA: Diagnosis not present

## 2021-04-24 DIAGNOSIS — Z91040 Latex allergy status: Secondary | ICD-10-CM | POA: Diagnosis not present

## 2021-04-24 DIAGNOSIS — R1013 Epigastric pain: Secondary | ICD-10-CM | POA: Diagnosis not present

## 2021-04-24 DIAGNOSIS — G43909 Migraine, unspecified, not intractable, without status migrainosus: Secondary | ICD-10-CM | POA: Diagnosis not present

## 2021-04-24 DIAGNOSIS — F17210 Nicotine dependence, cigarettes, uncomplicated: Secondary | ICD-10-CM | POA: Insufficient documentation

## 2021-04-24 DIAGNOSIS — Z91010 Allergy to peanuts: Secondary | ICD-10-CM | POA: Diagnosis not present

## 2021-04-24 LAB — CBC WITH DIFFERENTIAL/PLATELET
Abs Immature Granulocytes: 0.06 10*3/uL (ref 0.00–0.07)
Basophils Absolute: 0.1 10*3/uL (ref 0.0–0.1)
Basophils Relative: 1 %
Eosinophils Absolute: 0.1 10*3/uL (ref 0.0–0.5)
Eosinophils Relative: 1 %
HCT: 49 % — ABNORMAL HIGH (ref 36.0–46.0)
Hemoglobin: 17.4 g/dL — ABNORMAL HIGH (ref 12.0–15.0)
Immature Granulocytes: 0 %
Lymphocytes Relative: 23 %
Lymphs Abs: 3.5 10*3/uL (ref 0.7–4.0)
MCH: 33.3 pg (ref 26.0–34.0)
MCHC: 35.5 g/dL (ref 30.0–36.0)
MCV: 93.9 fL (ref 80.0–100.0)
Monocytes Absolute: 0.9 10*3/uL (ref 0.1–1.0)
Monocytes Relative: 6 %
Neutro Abs: 10.7 10*3/uL — ABNORMAL HIGH (ref 1.7–7.7)
Neutrophils Relative %: 69 %
Platelets: 498 10*3/uL — ABNORMAL HIGH (ref 150–400)
RBC: 5.22 MIL/uL — ABNORMAL HIGH (ref 3.87–5.11)
RDW: 13.3 % (ref 11.5–15.5)
WBC: 15.4 10*3/uL — ABNORMAL HIGH (ref 4.0–10.5)
nRBC: 0 % (ref 0.0–0.2)

## 2021-04-24 LAB — COMPREHENSIVE METABOLIC PANEL
ALT: 14 U/L (ref 0–44)
AST: 17 U/L (ref 15–41)
Albumin: 5.1 g/dL — ABNORMAL HIGH (ref 3.5–5.0)
Alkaline Phosphatase: 100 U/L (ref 38–126)
Anion gap: 15 (ref 5–15)
BUN: 15 mg/dL (ref 6–20)
CO2: 35 mmol/L — ABNORMAL HIGH (ref 22–32)
Calcium: 10.1 mg/dL (ref 8.9–10.3)
Chloride: 87 mmol/L — ABNORMAL LOW (ref 98–111)
Creatinine, Ser: 0.82 mg/dL (ref 0.44–1.00)
GFR, Estimated: >60 mL/min (ref >60)
Glucose, Bld: 118 mg/dL — ABNORMAL HIGH (ref 70–99)
Potassium: 2.6 mmol/L — CL (ref 3.5–5.1)
Sodium: 137 mmol/L (ref 135–145)
Total Bilirubin: 1 mg/dL (ref 0.3–1.2)
Total Protein: 8.6 g/dL — ABNORMAL HIGH (ref 6.5–8.1)

## 2021-04-24 LAB — LIPASE, BLOOD: Lipase: 49 U/L (ref 11–51)

## 2021-04-24 MED ORDER — METOCLOPRAMIDE HCL 5 MG/ML IJ SOLN
10.0000 mg | Freq: Once | INTRAMUSCULAR | Status: AC
  Administered 2021-04-24: 10 mg via INTRAVENOUS
  Filled 2021-04-24: qty 2

## 2021-04-24 MED ORDER — FENTANYL CITRATE (PF) 100 MCG/2ML IJ SOLN
100.0000 ug | Freq: Once | INTRAMUSCULAR | Status: AC
  Administered 2021-04-24: 100 ug via INTRAVENOUS
  Filled 2021-04-24: qty 2

## 2021-04-24 MED ORDER — ONDANSETRON HCL 4 MG/2ML IJ SOLN
4.0000 mg | Freq: Once | INTRAMUSCULAR | Status: AC
  Administered 2021-04-24: 4 mg via INTRAVENOUS
  Filled 2021-04-24: qty 2

## 2021-04-24 MED ORDER — SODIUM CHLORIDE 0.9 % IV BOLUS
1000.0000 mL | Freq: Once | INTRAVENOUS | Status: AC
  Administered 2021-04-24: 1000 mL via INTRAVENOUS

## 2021-04-24 MED ORDER — POTASSIUM CHLORIDE 20 MEQ/15ML (10%) PO SOLN: 40.0000 meq | Freq: Every day | ORAL | 0 refills | Status: AC

## 2021-04-24 MED ORDER — POTASSIUM CHLORIDE 20 MEQ PO PACK
40.0000 meq | PACK | Freq: Every day | ORAL | Status: DC
  Administered 2021-04-24: 40 meq via ORAL
  Filled 2021-04-24: qty 2

## 2021-04-24 NOTE — ED Provider Notes (Signed)
Emergency Department Provider Note   I have reviewed the triage vital signs and the nursing notes.   HISTORY  Chief Complaint Emesis   HPI Natalie Joseph is a 40 y.o. female with PMH reviewed below presents to the ED with abdominal pain and vomiting.  Patient reports 36 hours of symptoms with persistent vomiting.  She denies drug or alcohol use.  She states that she has a history of cyclical vomiting and that this feels similar to those past episodes. No fever/chills. No CP or SOB. States that in the past she feels improved with pain medication, Zofran/Reglan, and K replacement PRN. Abdominal pain is mainly epigastric. No vaginal bleeding/discharge. No dysuria, hesitancy, or urgency.   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 01/07/2017   Anorectal pain 11/11/2016   Internal and external prolapsed hemorrhoids 07/16/2016   Anxiety 08/26/2015   Smoker 06/09/2013    Past Surgical History:  Procedure Laterality Date   ESOPHAGOGASTRODUODENOSCOPY     WISDOM TOOTH EXTRACTION      Allergies Septra ds [sulfamethoxazole w/trimethoprim (co-trimoxazole)], Sulfa antibiotics, Cheese, Corn-containing products, Eggs or egg-derived products, Latex, Milk-related compounds, Peanut-containing drug products, and Shellfish allergy  Family History  Problem Relation Age of Onset   Hypertension Mother    Hyperlipidemia Mother    Kidney disease Father    Hypertension Father    Depression Father    Diabetes Father    Heart disease Father    Stroke Maternal Grandmother    Cancer Maternal Grandfather        lung   Stroke Paternal Grandmother    Heart disease Paternal  Grandfather    Cancer Paternal Aunt        breast   Diabetes Paternal Aunt    Breast cancer Paternal Aunt     Social History Social History   Tobacco Use   Smoking status: Every Day    Packs/day: 2.00    Years: 17.00    Pack years: 34.00    Types: Cigarettes   Smokeless tobacco: Never  Vaping Use   Vaping Use: Never used  Substance Use Topics   Alcohol use: No   Drug use: No    Review of Systems  Constitutional: No fever/chills Eyes: No visual changes. ENT: No sore throat. Cardiovascular: Denies chest pain. Respiratory: Denies shortness of breath. Gastrointestinal: Positive epigastric abdominal pain. Positive nausea and vomiting.  No diarrhea.  No constipation. Genitourinary: Negative for dysuria. Musculoskeletal: Negative for back pain. Skin: Negative for rash. Neurological: Negative for headaches, focal weakness or numbness.  10-point ROS otherwise negative.  ____________________________________________   PHYSICAL EXAM:  VITAL SIGNS: ED Triage Vitals  Enc Vitals Group     BP 04/24/21 0020 (!) 130/107     Pulse Rate 04/24/21 0020 (!) 124     Resp 04/24/21 0020 18     Temp 04/24/21 0020 98.9 F (37.2 C)     Temp Source 04/24/21 0020 Oral     SpO2 04/24/21 0020 96 %     Weight 04/24/21 0023 116 lb 9.6 oz (52.9 kg)     Height 04/24/21 0023 '5\' 4"'$  (  1.626 m)   Constitutional: Alert and oriented. Well appearing and in no acute distress. Eyes: Conjunctivae are normal. Head: Atraumatic. Nose: No congestion/rhinnorhea. Mouth/Throat: Mucous membranes are moist.  Neck: No stridor.   Cardiovascular: Tachycardia. Good peripheral circulation. Grossly normal heart sounds.   Respiratory: Normal respiratory effort.  No retractions. Lungs CTAB. Gastrointestinal: Soft with mild epigastric tenderness. Negative Murphy's sign. No peritoneal findings. No distention.  Musculoskeletal: No lower extremity tenderness nor edema. No gross deformities of extremities. Neurologic:   Normal speech and language. No gross focal neurologic deficits are appreciated.  Skin:  Skin is warm, dry and intact. No rash noted.   ____________________________________________   LABS (all labs ordered are listed, but only abnormal results are displayed)  Labs Reviewed  COMPREHENSIVE METABOLIC PANEL - Abnormal; Notable for the following components:      Result Value   Potassium 2.6 (*)    Chloride 87 (*)    CO2 35 (*)    Glucose, Bld 118 (*)    Total Protein 8.6 (*)    Albumin 5.1 (*)    All other components within normal limits  CBC WITH DIFFERENTIAL/PLATELET - Abnormal; Notable for the following components:   WBC 15.4 (*)    RBC 5.22 (*)    Hemoglobin 17.4 (*)    HCT 49.0 (*)    Platelets 498 (*)    Neutro Abs 10.7 (*)    All other components within normal limits  LIPASE, BLOOD  PREGNANCY, URINE   ____________________________________________  RADIOLOGY  None   ____________________________________________   PROCEDURES  Procedure(s) performed:   Procedures  None  ____________________________________________   INITIAL IMPRESSION / ASSESSMENT AND PLAN / ED COURSE  Pertinent labs & imaging results that were available during my care of the patient were reviewed by me and considered in my medical decision making (see chart for details).   Patient presents to the emergency department with epigastric abdominal pain and vomiting similar to her prior cyclical vomiting episodes.  No peritoneal findings on exam to prompt emergent abdominal imaging.  Lab work shows mild hypokalemia.  Will replace with PO K+ and treat symptoms empirically.   Differential diagnosis includes but is not exclusive to acute cholecystitis, intrathoracic causes for epigastric abdominal pain, gastritis, duodenitis, pancreatitis, small bowel or large bowel obstruction, abdominal aortic aneurysm, hernia, gastritis, etc.   03:15 AM  Patient's labs reviewed.  She is feeling much better on  reevaluation.  She states try to go home and eat something and is feeling hungry.  She reports having difficulty with the [facility] tabs and so sent home with potassium solution for the next several days.  Advised to have repeat blood work in the next week to recheck potassium levels.  Also provided contact information for the GI service on call should she need to establish care with a GI physician locally.  Discussed ED return precautions.  Patient has a ride home as she did receive opiate medications here.  ____________________________________________  FINAL CLINICAL IMPRESSION(S) / ED DIAGNOSES  Final diagnoses:  Cyclical vomiting syndrome not associated with migraine  Hypokalemia     MEDICATIONS GIVEN DURING THIS VISIT:  Medications  potassium chloride (KLOR-CON) packet 40 mEq (40 mEq Oral Given 04/24/21 0144)  sodium chloride 0.9 % bolus 1,000 mL ( Intravenous Stopped 04/24/21 0157)  ondansetron (ZOFRAN) injection 4 mg (4 mg Intravenous Given 04/24/21 0058)  metoCLOPramide (REGLAN) injection 10 mg (10 mg Intravenous Given 04/24/21 0104)  fentaNYL (SUBLIMAZE) injection 100 mcg (100 mcg Intravenous Given 04/24/21  0100)     NEW OUTPATIENT MEDICATIONS STARTED DURING THIS VISIT:  New Prescriptions   POTASSIUM CHLORIDE 20 MEQ/15ML (10%) SOLN    Take 30 mLs (40 mEq total) by mouth daily for 4 days.    Note:  This document was prepared using Dragon voice recognition software and may include unintentional dictation errors.  Nanda Quinton, MD, [facility]    Tery Hoeger, [facility], MD 04/24/21 [phone]

## 2021-04-24 NOTE — Discharge Instructions (Addendum)
Received emergency room today with nausea and vomiting.  Controlled your symptoms here and sending you home with some prescription for potassium solution to take over the next several days.  Please drink plenty of fluids and use your home nausea medications as needed.  Return with any new or suddenly worsening symptoms.  I have also listed the name of the GI doctor should you need one locally.

## 2021-04-24 NOTE — ED Triage Notes (Signed)
Pt states has history of Cyclic vomiting disease, started emesis every 15-30 for 12 hours today. Had a break in cycle  was having emesis for 24 hours before. This episode.

## 2021-05-20 ENCOUNTER — Encounter (HOSPITAL_BASED_OUTPATIENT_CLINIC_OR_DEPARTMENT_OTHER): Payer: Self-pay | Admitting: *Deleted

## 2021-05-20 ENCOUNTER — Emergency Department (HOSPITAL_BASED_OUTPATIENT_CLINIC_OR_DEPARTMENT_OTHER): Payer: Medicaid Other

## 2021-05-20 ENCOUNTER — Other Ambulatory Visit: Payer: Self-pay

## 2021-05-20 ENCOUNTER — Emergency Department (HOSPITAL_BASED_OUTPATIENT_CLINIC_OR_DEPARTMENT_OTHER)
Admission: EM | Admit: 2021-05-20 | Discharge: 2021-05-20 | Disposition: A | Discharge status: Home / Self Care | Payer: Medicaid Other | Attending: Emergency Medicine | Admitting: Emergency Medicine

## 2021-05-20 DIAGNOSIS — D72829 Elevated white blood cell count, unspecified: Secondary | ICD-10-CM | POA: Insufficient documentation

## 2021-05-20 DIAGNOSIS — Z91010 Allergy to peanuts: Secondary | ICD-10-CM | POA: Insufficient documentation

## 2021-05-20 DIAGNOSIS — R202 Paresthesia of skin: Secondary | ICD-10-CM | POA: Insufficient documentation

## 2021-05-20 DIAGNOSIS — F17210 Nicotine dependence, cigarettes, uncomplicated: Secondary | ICD-10-CM | POA: Insufficient documentation

## 2021-05-20 DIAGNOSIS — M79604 Pain in right leg: Secondary | ICD-10-CM | POA: Diagnosis not present

## 2021-05-20 DIAGNOSIS — R1115 Cyclical vomiting syndrome unrelated to migraine: Secondary | ICD-10-CM | POA: Insufficient documentation

## 2021-05-20 DIAGNOSIS — Z91040 Latex allergy status: Secondary | ICD-10-CM | POA: Insufficient documentation

## 2021-05-20 DIAGNOSIS — R1110 Vomiting, unspecified: Secondary | ICD-10-CM | POA: Diagnosis present

## 2021-05-20 LAB — CBC WITH DIFFERENTIAL/PLATELET
Abs Immature Granulocytes: 0.08 10*3/uL — ABNORMAL HIGH (ref 0.00–0.07)
Basophils Absolute: 0.1 10*3/uL (ref 0.0–0.1)
Basophils Relative: 1 %
Eosinophils Absolute: 0.1 10*3/uL (ref 0.0–0.5)
Eosinophils Relative: 1 %
HCT: 43.8 % (ref 36.0–46.0)
Hemoglobin: 15.5 g/dL — ABNORMAL HIGH (ref 12.0–15.0)
Immature Granulocytes: 1 %
Lymphocytes Relative: 19 %
Lymphs Abs: 2.5 10*3/uL (ref 0.7–4.0)
MCH: 34.1 pg — ABNORMAL HIGH (ref 26.0–34.0)
MCHC: 35.4 g/dL (ref 30.0–36.0)
MCV: 96.5 fL (ref 80.0–100.0)
Monocytes Absolute: 0.7 10*3/uL (ref 0.1–1.0)
Monocytes Relative: 5 %
Neutro Abs: 9.8 10*3/uL — ABNORMAL HIGH (ref 1.7–7.7)
Neutrophils Relative %: 73 %
Platelets: 427 10*3/uL — ABNORMAL HIGH (ref 150–400)
RBC: 4.54 MIL/uL (ref 3.87–5.11)
RDW: 14.5 % (ref 11.5–15.5)
WBC: 13.2 10*3/uL — ABNORMAL HIGH (ref 4.0–10.5)
nRBC: 0 % (ref 0.0–0.2)

## 2021-05-20 LAB — COMPREHENSIVE METABOLIC PANEL
ALT: 17 U/L (ref 0–44)
AST: 18 U/L (ref 15–41)
Albumin: 4.6 g/dL (ref 3.5–5.0)
Alkaline Phosphatase: 86 U/L (ref 38–126)
Anion gap: 10 (ref 5–15)
BUN: 11 mg/dL (ref 6–20)
CO2: 29 mmol/L (ref 22–32)
Calcium: 9.6 mg/dL (ref 8.9–10.3)
Chloride: 99 mmol/L (ref 98–111)
Creatinine, Ser: 0.64 mg/dL (ref 0.44–1.00)
GFR, Estimated: >60 mL/min (ref >60)
Glucose, Bld: 110 mg/dL — ABNORMAL HIGH (ref 70–99)
Potassium: 3.5 mmol/L (ref 3.5–5.1)
Sodium: 138 mmol/L (ref 135–145)
Total Bilirubin: 0.6 mg/dL (ref 0.3–1.2)
Total Protein: 7.8 g/dL (ref 6.5–8.1)

## 2021-05-20 LAB — LIPASE, BLOOD: Lipase: 63 U/L — ABNORMAL HIGH (ref 11–51)

## 2021-05-20 MED ORDER — LACTATED RINGERS IV BOLUS
1000.0000 mL | Freq: Once | INTRAVENOUS | Status: AC
  Administered 2021-05-20: 1000 mL via INTRAVENOUS

## 2021-05-20 MED ORDER — METOCLOPRAMIDE HCL 5 MG/ML IJ SOLN
10.0000 mg | Freq: Once | INTRAMUSCULAR | Status: AC
  Administered 2021-05-20: 10 mg via INTRAVENOUS
  Filled 2021-05-20: qty 2

## 2021-05-20 MED ORDER — ONDANSETRON HCL 4 MG/2ML IJ SOLN
4.0000 mg | Freq: Once | INTRAMUSCULAR | Status: AC
  Administered 2021-05-20: 4 mg via INTRAVENOUS
  Filled 2021-05-20: qty 2

## 2021-05-20 NOTE — ED Notes (Signed)
Appears in no distress and is not vomiting at this time.

## 2021-05-20 NOTE — ED Notes (Signed)
Called pt to take to treatment room  No answer from lobby  Greeter states pt walked outside  [place]win outside and did not see pt at this time

## 2021-05-20 NOTE — ED Notes (Signed)
Pt states she is ready for a smoke she has been here so long  she is ready to go states cannot get a urine

## 2021-05-20 NOTE — ED Provider Notes (Signed)
[facility] Provider Note   CSN: [id number] Arrival date & time: 05/20/21  0020     History Chief Complaint  Patient presents with   Vomiting    Natalie Joseph is a 40 y.o. female.  The history is provided by the patient.  She has history of cyclic vomiting syndrome, pheochromocytoma, cervical radiculopathy, adjustment disorder and generalized anxiety disorder and comes in with multiple complaints.  She states that she had a flareup of her cyclic vomiting syndrome which lasted about 12 hours, last episode of emesis was about 9:30 AM yesterday.  However, she states that she is feeling generally weak and thinks she is dehydrated.  She is also concerned because she has numbness on the lateral aspect of her right leg and she is worried she has a blood clot in the leg.  She did have some leg swelling previously but thinks that it has gone down.  She denies any chest pain or shortness of breath.  She denies any fever, chills, sweats.  She has had some back pain and she points to the regions of the paralumbar area superiorly.  She relates this to her history of pheochromocytoma.  Of note, she states that when she gets IV fluids, she needs an injection of both metoclopramide and ondansetron to good control of her nausea.   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 01/07/2017   Anorectal pain 11/11/2016   Internal and external prolapsed hemorrhoids 07/16/2016   Anxiety 08/26/2015   Smoker 06/09/2013    Past Surgical History:  Procedure Laterality Date   ESOPHAGOGASTRODUODENOSCOPY     WISDOM TOOTH EXTRACTION       OB History      Gravida  8   Para  1   Term  1   Preterm      AB  7   Living         SAB  7   IAB      Ectopic      Multiple      Live Births  1           Family History  Problem Relation Age of Onset   Hypertension Mother    Hyperlipidemia Mother    Kidney disease Father    Hypertension Father    Depression Father    Diabetes Father    Heart disease Father    Stroke Maternal Grandmother    Cancer Maternal Grandfather        lung   Stroke Paternal Grandmother    Heart disease Paternal Grandfather    Cancer Paternal Aunt        breast   Diabetes Paternal Aunt    Breast cancer Paternal Aunt     Social History   Tobacco Use   Smoking status: Every Day    Packs/day: 2.00    Years: 17.00    Pack years: 34.00    Types: Cigarettes   Smokeless tobacco: Never  Vaping Use   Vaping Use: Never used  Substance Use Topics   Alcohol use: No   Drug use: No  Home Medications Prior to Admission medications   Medication Sig Start Date End Date Taking? Authorizing Provider  ALPRAZolam Duanne Moron) 0.5 MG tablet  07/03/16   [provider]  busPIRone (BUSPAR) 10 MG tablet Take 1 tablet (10 mg total) by mouth 3 (three) times daily as needed. Patient not taking: No sig reported 09/16/20   Leftwich-Kirby, Kathie Dike, CNM  cyclobenzaprine (FLEXERIL) 10 MG tablet Take 10 mg by mouth 3 (three) times daily. 09/13/20   [provider]  doxycycline (VIBRAMYCIN) 100 MG capsule Take 1 capsule (100 mg total) by mouth 2 (two) times daily. Patient not taking: Reported on 04/22/2021 04/14/21   Guss Bunde, MD  esomeprazole (NEXIUM) 40 MG capsule Take 40 mg by mouth 2 (two) times daily. 04/27/20   [provider]  hydrOXYzine (VISTARIL) 25 MG capsule Take 1-2 capsules (25-50 mg total) by mouth 3 (three) times daily as needed. Patient not taking: No sig reported 09/16/20   Leftwich-Kirby, Kathie Dike, CNM  metoCLOPramide (REGLAN) 10 MG tablet Take 1 tablet (10 mg total) by mouth  every 6 (six) hours as needed for nausea (nausea/headache). Patient not taking: No sig reported 02/15/20   Palumbo, April, MD  nortriptyline (PAMELOR) 10 MG capsule Take by mouth. Patient not taking: No sig reported 11/18/20   [provider]  ondansetron (ZOFRAN-ODT) 4 MG disintegrating tablet Take 4 mg by mouth every 6 (six) hours as needed. Patient not taking: No sig reported 12/12/20   [provider]  penicillin v potassium (VEETID) 500 MG tablet Take 500 mg by mouth 4 (four) times daily. Patient not taking: Reported on 04/22/2021 03/31/21   [provider]  potassium chloride 20 MEQ/15ML (10%) SOLN Take 30 mLs (40 mEq total) by mouth daily for 4 days. 04/24/21 04/28/21  Margette Fast, MD  potassium iodide (SSKI) 1 GM/ML solution Take by mouth. Patient not taking: No sig reported    [provider]  propranolol ER (INDERAL LA) 80 MG 24 hr capsule Take 80 mg by mouth daily. Patient not taking: No sig reported 05/18/18   [provider]  sucralfate (CARAFATE) 1 g tablet Take by mouth. Patient not taking: No sig reported 11/19/20   [provider]  tretinoin (RETIN-A) 0.05 % cream Pea sized amount to entire face nightly. Patient not taking: No sig reported 12/17/20   [provider]  zonisamide (ZONEGRAN) 100 MG capsule 100 mg 4 (four) times daily as needed. Patient not taking: No sig reported 01/18/19   [provider]  albuterol (ACCUNEB) 0.63 MG/3ML nebulizer solution Inhale into the lungs. 09/16/16 03/24/20  [provider]  norgestimate-ethinyl estradiol (ORTHO-CYCLEN,SPRINTEC,PREVIFEM) 0.25-35 MG-MCG tablet Take 1 tablet by mouth daily. 06/15/17 05/26/19  Morene Crocker, CNM    Allergies    Septra ds [sulfamethoxazole w/trimethoprim (co-trimoxazole)], Sulfa antibiotics, Cheese, Corn-containing products, Eggs or egg-derived products, Latex, Milk-related compounds, Peanut-containing drug products, and Shellfish  allergy  Review of Systems   Review of Systems  All other systems reviewed and are negative.  Physical Exam Updated Vital Signs BP 117/83   Pulse 81   Temp 98.9 F (37.2 C) (Oral)   Resp 16   Ht '5\' 4"'$  (1.626 m)   Wt 53.5 kg   LMP 05/13/2021   SpO2 100%   BMI 20.25 kg/m   Physical Exam Vitals and nursing note reviewed.  40 year old female, resting comfortably and in no acute distress. Vital signs are normal. Oxygen saturation is 100%, which is normal. Head  is normocephalic and atraumatic. PERRLA, EOMI. Oropharynx is clear. Neck is nontender and supple without adenopathy or JVD. Back is nontender and there is no CVA tenderness.  Straight leg raise is negative bilaterally. Lungs are clear without rales, wheezes, or rhonchi. Chest is nontender. Heart has regular rate and rhythm without murmur. Abdomen is soft, flat, nontender without masses or hepatosplenomegaly and peristalsis is normoactive. Extremities have no cyanosis or edema, full range of motion is present. Skin is warm and dry without rash. Neurologic: Mental status is normal, cranial nerves are intact.  Strength is 5/5 in both arms and both legs.  There is decreased pinprick sensation over the lateral aspect of the right thigh, right calf, right foot.  ED Results / Procedures / Treatments   Labs (all labs ordered are listed, but only abnormal results are displayed) Labs Reviewed  CBC WITH DIFFERENTIAL/PLATELET - Abnormal; Notable for the following components:      Result Value   WBC 13.2 (*)    Hemoglobin 15.5 (*)    MCH 34.1 (*)    Platelets 427 (*)    Neutro Abs 9.8 (*)    Abs Immature Granulocytes 0.08 (*)    All other components within normal limits  COMPREHENSIVE METABOLIC PANEL - Abnormal; Notable for the following components:   Glucose, Bld 110 (*)    All other components within normal limits  LIPASE, BLOOD - Abnormal; Notable for the following components:   Lipase 63 (*)    All other components within  normal limits  PREGNANCY, URINE  URINALYSIS, ROUTINE W REFLEX MICROSCOPIC   Procedures Procedures   Medications Ordered in ED Medications  lactated ringers bolus 1,000 mL (has no administration in time range)  metoCLOPramide (REGLAN) injection 10 mg (has no administration in time range)  ondansetron (ZOFRAN) injection 4 mg (has no administration in time range)    ED Course  I have reviewed the triage vital signs and the nursing notes.  Pertinent labs & imaging results that were available during my care of the patient were reviewed by me and considered in my medical decision making (see chart for details).    MDM Rules/Calculators/A&P                         Episode of cyclic vomiting which has resolved.  Complaints of numbness of the lateral aspect of the right leg.  Area of numbness is involving the lateral thigh, calf, foot.  This does not follow any dermatome pattern I am familiar with and is also not consistent with spinal cord injury or brain injury.  I suspect that it is a conversion reaction.  Patient states she has a neurologist, she can follow-up with neurologist for further work-up of her numbness.  She will be given IV fluids and IV ondansetron and metoclopramide and will get a venous ultrasound to rule out DVT.  Of note, labs obtained at triage showed mild leukocytosis and are otherwise normal.  Old records are reviewed, and she does have multiple ED visits for cyclic vomiting.  Case is signed out to Dr. Dina Rich.  Final Clinical Impression(s) / ED Diagnoses Final diagnoses:  Cyclic vomiting syndrome  Paresthesia of right leg    Rx / DC Orders ED Discharge Orders     None        Delora Fuel, MD [id number] [phone]

## 2021-05-20 NOTE — Discharge Instructions (Addendum)
You have been seen and discharged from the emergency department.  You blood work was normal for you. The [place] of your leg was normal, no blood clot. You were unable to provide [place] with a urine sample today, we are unable to test for pregnancy, it is recommended that you test for this as soon as possible to rule out as pregnancy being the source of her vomiting.  Follow-up with your primary provider for reevaluation and further care. Take home medications as prescribed. If you have any worsening symptoms or further concerns for your health please return to an emergency department for further evaluation.

## 2021-05-20 NOTE — ED Notes (Signed)
Patient eating ice chips at this time

## 2021-05-20 NOTE — ED Triage Notes (Signed)
C/o multiple complaints , c/o vomiting x 2 days, also c/o right lowe leg swelling

## 2021-05-20 NOTE — ED Provider Notes (Signed)
Patient signed out to me by previous provider. Please refer to their note for full HPI.  Briefly this is a 40 year old female with past medical history of cyclic vomiting who presents emergency department with ongoing emesis.  She was also complaining of paresthesias of the lower extremity.  Plan for symptomatic treatment and ultrasound of the leg to rule out DVT.  Currently patient is resting. Physical Exam  BP 103/64 (BP Location: Right Arm)   Pulse 69   Temp 98.9 F (37.2 C) (Oral)   Resp 14   Ht '5\' 4"'$  (1.626 m)   Wt 53.5 kg   LMP 05/13/2021   SpO2 96%   BMI 20.25 kg/m   Physical Exam Vitals and nursing note reviewed.  Constitutional:      Appearance: Normal appearance.  HENT:     Head: Normocephalic.     Mouth/Throat:     Mouth: Mucous membranes are moist.  Cardiovascular:     Rate and Rhythm: Normal rate.  Pulmonary:     Effort: Pulmonary effort is normal. No respiratory distress.  Abdominal:     Palpations: Abdomen is soft.  Skin:    General: Skin is warm.  Neurological:     Mental Status: She is alert and oriented to person, place, and time. Mental status is at baseline.  Psychiatric:        Mood and Affect: Mood normal.    ED Course/Procedures     Procedures  MDM   Ultrasound of the leg is negative for DVT.  Review of blood work shows baseline abnormalities for the patient.  After symptomatic treatment she states that she feels better and is requesting to leave.  She was unable to provide [place] with a urine sample, unable to rule out pregnancy.  Patient states that there is no chance that she is pregnant and does not want to wait to give a urine sample for possible pregnancy.  Of advised the patient to test for this as an outpatient to rule out her vomiting being related to pregnancy.  However low suspicion for this.  Vitals are stable, she is ambulatory, able to p.o.  Patient at this time appears safe and stable for discharge and will be treated as an outpatient.   Discharge plan and strict return to ED precautions discussed, patient verbalizes understanding and agreement.       Lorelle Gibbs, DO 05/20/21 [id number]

## 2021-05-26 ENCOUNTER — Emergency Department (HOSPITAL_BASED_OUTPATIENT_CLINIC_OR_DEPARTMENT_OTHER)
Admission: EM | Admit: 2021-05-26 | Discharge: 2021-05-26 | Disposition: A | Discharge status: Home / Self Care | Payer: Medicaid Other | Attending: Emergency Medicine | Admitting: Emergency Medicine

## 2021-05-26 ENCOUNTER — Other Ambulatory Visit: Payer: Self-pay

## 2021-05-26 ENCOUNTER — Encounter (HOSPITAL_BASED_OUTPATIENT_CLINIC_OR_DEPARTMENT_OTHER): Payer: Self-pay | Admitting: Emergency Medicine

## 2021-05-26 DIAGNOSIS — Z91040 Latex allergy status: Secondary | ICD-10-CM | POA: Diagnosis not present

## 2021-05-26 DIAGNOSIS — R1115 Cyclical vomiting syndrome unrelated to migraine: Secondary | ICD-10-CM

## 2021-05-26 DIAGNOSIS — F17210 Nicotine dependence, cigarettes, uncomplicated: Secondary | ICD-10-CM | POA: Insufficient documentation

## 2021-05-26 DIAGNOSIS — R1084 Generalized abdominal pain: Secondary | ICD-10-CM | POA: Insufficient documentation

## 2021-05-26 DIAGNOSIS — Z91010 Allergy to peanuts: Secondary | ICD-10-CM | POA: Diagnosis not present

## 2021-05-26 DIAGNOSIS — R1110 Vomiting, unspecified: Secondary | ICD-10-CM | POA: Diagnosis present

## 2021-05-26 LAB — CBC
HCT: 45.9 % (ref 36.0–46.0)
Hemoglobin: 16 g/dL — ABNORMAL HIGH (ref 12.0–15.0)
MCH: 33.4 pg (ref 26.0–34.0)
MCHC: 34.9 g/dL (ref 30.0–36.0)
MCV: 95.8 fL (ref 80.0–100.0)
Platelets: 535 10*3/uL — ABNORMAL HIGH (ref 150–400)
RBC: 4.79 MIL/uL (ref 3.87–5.11)
RDW: 14.1 % (ref 11.5–15.5)
WBC: 15 10*3/uL — ABNORMAL HIGH (ref 4.0–10.5)
nRBC: 0 % (ref 0.0–0.2)

## 2021-05-26 LAB — URINALYSIS, ROUTINE W REFLEX MICROSCOPIC
Bilirubin Urine: NEGATIVE
Glucose, UA: NEGATIVE mg/dL
Hgb urine dipstick: NEGATIVE
Ketones, ur: NEGATIVE mg/dL
Leukocytes,Ua: NEGATIVE
Nitrite: NEGATIVE
Protein, ur: NEGATIVE mg/dL
Specific Gravity, Urine: 1.015 (ref 1.005–1.030)
pH: 7.5 (ref 5.0–8.0)

## 2021-05-26 LAB — COMPREHENSIVE METABOLIC PANEL
ALT: 13 U/L (ref 0–44)
AST: 20 U/L (ref 15–41)
Albumin: 4.8 g/dL (ref 3.5–5.0)
Alkaline Phosphatase: 97 U/L (ref 38–126)
Anion gap: 13 (ref 5–15)
BUN: 7 mg/dL (ref 6–20)
CO2: 27 mmol/L (ref 22–32)
Calcium: 9.8 mg/dL (ref 8.9–10.3)
Chloride: 95 mmol/L — ABNORMAL LOW (ref 98–111)
Creatinine, Ser: 0.7 mg/dL (ref 0.44–1.00)
GFR, Estimated: >60 mL/min (ref >60)
Glucose, Bld: 100 mg/dL — ABNORMAL HIGH (ref 70–99)
Potassium: 2.9 mmol/L — ABNORMAL LOW (ref 3.5–5.1)
Sodium: 135 mmol/L (ref 135–145)
Total Bilirubin: 0.9 mg/dL (ref 0.3–1.2)
Total Protein: 8 g/dL (ref 6.5–8.1)

## 2021-05-26 LAB — LIPASE, BLOOD: Lipase: 91 U/L — ABNORMAL HIGH (ref 11–51)

## 2021-05-26 LAB — PREGNANCY, URINE: Preg Test, Ur: NEGATIVE

## 2021-05-26 MED ORDER — POTASSIUM CHLORIDE CRYS ER 20 MEQ PO TBCR
40.0000 meq | EXTENDED_RELEASE_TABLET | Freq: Once | ORAL | Status: AC
  Administered 2021-05-26: 40 meq via ORAL
  Filled 2021-05-26: qty 2

## 2021-05-26 MED ORDER — ONDANSETRON HCL 4 MG/2ML IJ SOLN
4.0000 mg | Freq: Once | INTRAMUSCULAR | Status: AC
  Administered 2021-05-26: 4 mg via INTRAVENOUS
  Filled 2021-05-26: qty 2

## 2021-05-26 MED ORDER — SODIUM CHLORIDE 0.9 % IV BOLUS
1000.0000 mL | Freq: Once | INTRAVENOUS | Status: AC
  Administered 2021-05-26: 1000 mL via INTRAVENOUS

## 2021-05-26 MED ORDER — METOCLOPRAMIDE HCL 5 MG/ML IJ SOLN
10.0000 mg | Freq: Once | INTRAMUSCULAR | Status: AC
  Administered 2021-05-26: 10 mg via INTRAVENOUS
  Filled 2021-05-26: qty 2

## 2021-05-26 NOTE — Discharge Instructions (Signed)
It was a pleasure taking care of you today.  As discussed, your potassium was slightly low.  Continue to take your potassium as prescribed by your previous provider.  Please follow-up with your GI doctor within 1 week.  Return to the ER for new or worsening symptoms.

## 2021-05-26 NOTE — ED Triage Notes (Addendum)
States  has multiple issues  had an episode  of cyclic vomiting and other issues, pt has been seen for same states did not get her potassium filled from last time

## 2021-05-26 NOTE — ED Provider Notes (Signed)
[facility] Provider Note   CSN: [id number] Arrival date & time: 05/26/21  1555     History Chief Complaint  Patient presents with   Emesis    Natalie Joseph is a 40 y.o. female with a past medical history significant for adjustment disorder, cervical radiculopathy, cyclic vomiting syndrome, and pheochromocytoma who presents to the ED due to numerous episodes of nonbilious emesis x1 day.  Patient admits to vomiting every 15 to 30 minutes for the past 24 hours.  She endorses some streaks of blood in her emesis.  Admits to some "burning" to her right flank region.  No urinary or vaginal symptoms.  Patient states this feels like her typical cyclic vomiting episodes.  Denies marijuana use.  Patient states Zofran and Reglan typically helps in the past.  Chart reviewed.  Patient has been seen numerous times for the same complaint.  Patient was also seen by PCP earlier today.  No chest pain or shortness of breath.  She has not tried thing for symptoms prior to arrival.  No aggravating or alleviating factors.  No fever or chills.  Denies diarrhea.  History obtained from patient and past medical records. No interpreter used during encounter.       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 01/07/2017   Anorectal pain 11/11/2016   Internal and external prolapsed hemorrhoids 07/16/2016   Anxiety 08/26/2015   Smoker 06/09/2013    Past Surgical History:  Procedure Laterality Date   ESOPHAGOGASTRODUODENOSCOPY     WISDOM TOOTH EXTRACTION       OB History     Gravida  8   Para  1   Term  1   Preterm      AB  7   Living          SAB  7   IAB      Ectopic      Multiple      Live Births  1           Family History  Problem Relation Age of Onset   Hypertension Mother    Hyperlipidemia Mother    Kidney disease Father    Hypertension Father    Depression Father    Diabetes Father    Heart disease Father    Stroke Maternal Grandmother    Cancer Maternal Grandfather        lung   Stroke Paternal Grandmother    Heart disease Paternal Grandfather    Cancer Paternal Aunt        breast   Diabetes Paternal Aunt    Breast cancer Paternal Aunt     Social History   Tobacco Use   Smoking status: Every Day    Packs/day: 2.00    Years: 17.00    Pack years: 34.00    Types: Cigarettes   Smokeless tobacco: Never  Vaping Use   Vaping Use: Never used  Substance Use Topics   Alcohol use: No   Drug use: No    Home Medications Prior to Admission medications   Medication Sig Start Date End  Date Taking? Authorizing Provider  ALPRAZolam Duanne Moron) 0.5 MG tablet  07/03/16   [provider]  busPIRone (BUSPAR) 10 MG tablet Take 1 tablet (10 mg total) by mouth 3 (three) times daily as needed. Patient not taking: No sig reported 09/16/20   Leftwich-Kirby, Kathie Dike, CNM  cyclobenzaprine (FLEXERIL) 10 MG tablet Take 10 mg by mouth 3 (three) times daily. 09/13/20   [provider]  doxycycline (VIBRAMYCIN) 100 MG capsule Take 1 capsule (100 mg total) by mouth 2 (two) times daily. Patient not taking: Reported on 04/22/2021 04/14/21   Guss Bunde, MD  esomeprazole (NEXIUM) 40 MG capsule Take 40 mg by mouth 2 (two) times daily. 04/27/20   [provider]  hydrOXYzine (VISTARIL) 25 MG capsule Take 1-2 capsules (25-50 mg total) by mouth 3 (three) times daily as needed. Patient not taking: No sig reported 09/16/20   Leftwich-Kirby, Kathie Dike, CNM  metoCLOPramide (REGLAN) 10 MG tablet Take 1 tablet (10 mg total) by mouth every 6 (six) hours as needed for nausea (nausea/headache). Patient not taking:  No sig reported 02/15/20   Palumbo, April, MD  nortriptyline (PAMELOR) 10 MG capsule Take by mouth. Patient not taking: No sig reported 11/18/20   [provider]  ondansetron (ZOFRAN-ODT) 4 MG disintegrating tablet Take 4 mg by mouth every 6 (six) hours as needed. Patient not taking: No sig reported 12/12/20   [provider]  penicillin v potassium (VEETID) 500 MG tablet Take 500 mg by mouth 4 (four) times daily. Patient not taking: Reported on 04/22/2021 03/31/21   [provider]  potassium chloride 20 MEQ/15ML (10%) SOLN Take 30 mLs (40 mEq total) by mouth daily for 4 days. 04/24/21 04/28/21  Margette Fast, MD  potassium iodide (SSKI) 1 GM/ML solution Take by mouth. Patient not taking: No sig reported    [provider]  propranolol ER (INDERAL LA) 80 MG 24 hr capsule Take 80 mg by mouth daily. Patient not taking: No sig reported 05/18/18   [provider]  sucralfate (CARAFATE) 1 g tablet Take by mouth. Patient not taking: No sig reported 11/19/20   [provider]  tretinoin (RETIN-A) 0.05 % cream Pea sized amount to entire face nightly. Patient not taking: No sig reported 12/17/20   [provider]  zonisamide (ZONEGRAN) 100 MG capsule 100 mg 4 (four) times daily as needed. Patient not taking: No sig reported 01/18/19   [provider]  albuterol (ACCUNEB) 0.63 MG/3ML nebulizer solution Inhale into the lungs. 09/16/16 03/24/20  [provider]  norgestimate-ethinyl estradiol (ORTHO-CYCLEN,SPRINTEC,PREVIFEM) 0.25-35 MG-MCG tablet Take 1 tablet by mouth daily. 06/15/17 05/26/19  Morene Crocker, CNM    Allergies    Septra ds [sulfamethoxazole w/trimethoprim (co-trimoxazole)], Sulfa antibiotics, Cheese, Corn-containing products, Eggs or egg-derived products, Latex, Milk-related compounds, Peanut-containing drug products, and Shellfish allergy  Review of Systems   Review of Systems  Constitutional:  Negative for  chills and fever.  Respiratory:  Negative for shortness of breath.   Cardiovascular:  Negative for chest pain.  Gastrointestinal:  Positive for abdominal pain, nausea and vomiting. Negative for diarrhea.  All other systems reviewed and are negative.  Physical Exam Updated Vital Signs BP (!) 127/91 (BP Location: Right Arm)   Pulse 82   Temp 98.7 F (37.1 C) (Oral)   Resp 15   Ht '5\' 4"'$  (1.626 m)   Wt 53.1 kg   LMP 05/13/2021   SpO2 100%   BMI 20.08 kg/m   Physical Exam  Vitals and nursing note reviewed.  Constitutional:      General: She is not in acute distress.    Appearance: She is not ill-appearing.  HENT:     Head: Normocephalic.  Eyes:     Pupils: Pupils are equal, round, and reactive to light.  Cardiovascular:     Rate and Rhythm: Normal rate and regular rhythm.     Pulses: Normal pulses.     Heart sounds: Normal heart sounds. No murmur heard.   No friction rub. No gallop.  Pulmonary:     Effort: Pulmonary effort is normal.     Breath sounds: Normal breath sounds.  Abdominal:     General: Abdomen is flat. There is no distension.     Palpations: Abdomen is soft.     Tenderness: There is abdominal tenderness. There is no guarding or rebound.     Comments: Diffuse abdominal tenderness without rebound or guarding.  Negative CVA tenderness bilaterally.  Musculoskeletal:        General: Normal range of motion.     Cervical back: Neck supple.  Skin:    General: Skin is warm and dry.  Neurological:     General: No focal deficit present.     Mental Status: She is alert.  Psychiatric:        Mood and Affect: Mood normal.        Behavior: Behavior normal.    ED Results / Procedures / Treatments   Labs (all labs ordered are listed, but only abnormal results are displayed) Labs Reviewed  LIPASE, BLOOD - Abnormal; Notable for the following components:      Result Value   Lipase 91 (*)    All other components within normal limits  COMPREHENSIVE METABOLIC PANEL -  Abnormal; Notable for the following components:   Potassium 2.9 (*)    Chloride 95 (*)    Glucose, Bld 100 (*)    All other components within normal limits  CBC - Abnormal; Notable for the following components:   WBC 15.0 (*)    Hemoglobin 16.0 (*)    Platelets 535 (*)    All other components within normal limits  URINALYSIS, ROUTINE W REFLEX MICROSCOPIC  PREGNANCY, URINE    EKG EKG Interpretation  Date/Time:  Monday May 26 2021 18:26:04 EDT Ventricular Rate:  85 PR Interval:  148 QRS Duration: 83 QT Interval:  377 QTC Calculation: 449 R Axis:   49 Text Interpretation: Sinus rhythm Biatrial enlargement No significant change since prior 7/21 Confirmed by Aletta Edouard [phone]) on 05/26/2021 6:27:23 PM  Radiology No results found.  Procedures Procedures   Medications Ordered in ED Medications  potassium chloride SA (KLOR-CON) CR tablet 40 mEq (has no administration in time range)  sodium chloride 0.9 % bolus 1,000 mL (1,000 mLs Intravenous New Bag/Given 05/26/21 1824)  ondansetron (ZOFRAN) injection 4 mg (4 mg Intravenous Given 05/26/21 1836)  metoCLOPramide (REGLAN) injection 10 mg (10 mg Intravenous Given 05/26/21 1836)    ED Course  I have reviewed the triage vital signs and the nursing notes.  Pertinent labs & imaging results that were available during my care of the patient were reviewed by me and considered in my medical decision making (see chart for details).    MDM Rules/Calculators/A&P                          40 year old female presents to the ED due to numerous episodes of emesis for the past  24 hours.  History of cyclic vomiting syndrome.  Patient seen multiple times in the ED for the same.  Upon arrival, stable vitals.  Patient is afebrile, not tachycardic or hypoxic.  Patient in no acute distress.  Generalized abdominal tenderness without rebound or guarding.  Low suspicion for acute abdomen.  Abdominal labs ordered.  IV fluids, Zofran, Reglan given.   EKG personally reviewed which demonstrates normal sinus rhythm.  Normal QTC.  CBC significant for mild leukocytosis at 15.  Leukocytosis appears her baseline.  Hemoglobin also elevated in addition to platelets.  Could be related to hemoconcentration due to dehydration?  CMP significant for hypokalemia 2.9.  Potassium repleted here in the ED.  Normal LFTs.  Pregnancy test negative.  Lipase mildly elevated at 91.  UA unremarkable.  No signs of infection.  7:38 PM assessed patient at bedside after IV fluids, Zofran, and Reglan.  Patient notes resolution in symptoms.  She notes she is ready to go home.  Advised patient we typically like to p.o. challenge patient's prior to discharge however, patient declined p.o. challenge.  Patient states she will eat when she gets home.  Instructed patient to take her potassium as previously prescribed by her provider.  Follow-up with GI within the next week. Strict ED precautions discussed with patient. Patient states understanding and agrees to plan. Patient discharged home in no acute distress and stable vitals.  Final Clinical Impression(s) / ED Diagnoses Final diagnoses:  Cyclic vomiting syndrome    Rx / DC Orders ED Discharge Orders     None        Karie Kirks 05/26/21 1943    Hayden Rasmussen, MD 05/27/21 1919

## 2021-06-08 ENCOUNTER — Other Ambulatory Visit: Payer: Self-pay

## 2021-06-08 ENCOUNTER — Emergency Department (HOSPITAL_BASED_OUTPATIENT_CLINIC_OR_DEPARTMENT_OTHER)
Admission: EM | Admit: 2021-06-08 | Discharge: 2021-06-08 | Disposition: A | Discharge status: Home / Self Care | Payer: Medicaid Other | Attending: Emergency Medicine | Admitting: Emergency Medicine

## 2021-06-08 DIAGNOSIS — R109 Unspecified abdominal pain: Secondary | ICD-10-CM | POA: Diagnosis not present

## 2021-06-08 DIAGNOSIS — Z91010 Allergy to peanuts: Secondary | ICD-10-CM | POA: Diagnosis not present

## 2021-06-08 DIAGNOSIS — E876 Hypokalemia: Secondary | ICD-10-CM | POA: Diagnosis not present

## 2021-06-08 DIAGNOSIS — R1110 Vomiting, unspecified: Secondary | ICD-10-CM | POA: Diagnosis present

## 2021-06-08 DIAGNOSIS — R1115 Cyclical vomiting syndrome unrelated to migraine: Secondary | ICD-10-CM | POA: Insufficient documentation

## 2021-06-08 DIAGNOSIS — D72829 Elevated white blood cell count, unspecified: Secondary | ICD-10-CM | POA: Insufficient documentation

## 2021-06-08 DIAGNOSIS — R000 Tachycardia, unspecified: Secondary | ICD-10-CM | POA: Insufficient documentation

## 2021-06-08 DIAGNOSIS — Z91040 Latex allergy status: Secondary | ICD-10-CM | POA: Insufficient documentation

## 2021-06-08 DIAGNOSIS — F17210 Nicotine dependence, cigarettes, uncomplicated: Secondary | ICD-10-CM | POA: Insufficient documentation

## 2021-06-08 LAB — CBC WITH DIFFERENTIAL/PLATELET
Abs Immature Granulocytes: 0.07 10*3/uL (ref 0.00–0.07)
Basophils Absolute: 0.1 10*3/uL (ref 0.0–0.1)
Basophils Relative: 1 %
Eosinophils Absolute: 0.1 10*3/uL (ref 0.0–0.5)
Eosinophils Relative: 1 %
HCT: 46.2 % — ABNORMAL HIGH (ref 36.0–46.0)
Hemoglobin: 16.3 g/dL — ABNORMAL HIGH (ref 12.0–15.0)
Immature Granulocytes: 1 %
Lymphocytes Relative: 18 %
Lymphs Abs: 2.5 10*3/uL (ref 0.7–4.0)
MCH: 33.7 pg (ref 26.0–34.0)
MCHC: 35.3 g/dL (ref 30.0–36.0)
MCV: 95.5 fL (ref 80.0–100.0)
Monocytes Absolute: 1.1 10*3/uL — ABNORMAL HIGH (ref 0.1–1.0)
Monocytes Relative: 8 %
Neutro Abs: 10.1 10*3/uL — ABNORMAL HIGH (ref 1.7–7.7)
Neutrophils Relative %: 71 %
Platelets: 399 10*3/uL (ref 150–400)
RBC: 4.84 MIL/uL (ref 3.87–5.11)
RDW: 13.6 % (ref 11.5–15.5)
WBC: 14.1 10*3/uL — ABNORMAL HIGH (ref 4.0–10.5)
nRBC: 0 % (ref 0.0–0.2)

## 2021-06-08 LAB — COMPREHENSIVE METABOLIC PANEL
ALT: 11 U/L (ref 0–44)
AST: 16 U/L (ref 15–41)
Albumin: 4.8 g/dL (ref 3.5–5.0)
Alkaline Phosphatase: 94 U/L (ref 38–126)
Anion gap: 12 (ref 5–15)
BUN: 8 mg/dL (ref 6–20)
CO2: 23 mmol/L (ref 22–32)
Calcium: 9.9 mg/dL (ref 8.9–10.3)
Chloride: 99 mmol/L (ref 98–111)
Creatinine, Ser: 0.72 mg/dL (ref 0.44–1.00)
GFR, Estimated: >60 mL/min (ref >60)
Glucose, Bld: 96 mg/dL (ref 70–99)
Potassium: 3.3 mmol/L — ABNORMAL LOW (ref 3.5–5.1)
Sodium: 134 mmol/L — ABNORMAL LOW (ref 135–145)
Total Bilirubin: 1.4 mg/dL — ABNORMAL HIGH (ref 0.3–1.2)
Total Protein: 8.2 g/dL — ABNORMAL HIGH (ref 6.5–8.1)

## 2021-06-08 LAB — CBG MONITORING, ED: Glucose-Capillary: 91 mg/dL (ref 70–99)

## 2021-06-08 LAB — LIPASE, BLOOD: Lipase: 32 U/L (ref 11–51)

## 2021-06-08 MED ORDER — LACTATED RINGERS IV BOLUS
1000.0000 mL | Freq: Once | INTRAVENOUS | Status: AC
  Administered 2021-06-08: 1000 mL via INTRAVENOUS

## 2021-06-08 MED ORDER — LIDOCAINE VISCOUS HCL 2 % MT SOLN
15.0000 mL | Freq: Once | OROMUCOSAL | Status: AC
  Administered 2021-06-08: 15 mL via ORAL
  Filled 2021-06-08: qty 15

## 2021-06-08 MED ORDER — ALUM & MAG HYDROXIDE-SIMETH 200-200-20 MG/5ML PO SUSP
30.0000 mL | Freq: Once | ORAL | Status: AC
  Administered 2021-06-08: 30 mL via ORAL
  Filled 2021-06-08: qty 30

## 2021-06-08 MED ORDER — METOCLOPRAMIDE HCL 5 MG/ML IJ SOLN
10.0000 mg | Freq: Once | INTRAMUSCULAR | Status: AC
  Administered 2021-06-08: 10 mg via INTRAVENOUS
  Filled 2021-06-08: qty 2

## 2021-06-08 NOTE — ED Provider Notes (Signed)
[facility] EMERGENCY DEPARTMENT Provider Note   CSN: [id number] Arrival date & time: 06/08/21  0231     History Chief Complaint  Patient presents with   Emesis    Nelma Caylyn Tedeschi is a 40 y.o. female.  HPI 40 year old female presents with a chief complaint of abdominal pain.  She states she has a history of cyclic vomiting and had a recurrent episodes of vomiting starting on 9/30 and ending on 10/1.  However she has had abdominal pain ever since.  The pain became severe tonight and has not gone away.  It is both burning and sharp.  Feels like prior episodes of cyclic vomiting but is more intense and rated as a 10/10.  No fevers.  She had a slight bit of blood when she vomited.  No chest pain. Very similar to prior episodes but is more painful tonight. She also feels lightheaded tonight.  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 01/07/2017   Anorectal pain 11/11/2016   Internal and external prolapsed hemorrhoids 07/16/2016   Anxiety 08/26/2015   Smoker 06/09/2013    Past Surgical History:  Procedure Laterality Date   ESOPHAGOGASTRODUODENOSCOPY     WISDOM TOOTH EXTRACTION       OB History     Gravida  8   Para  1   Term  1   Preterm      AB  7   Living         SAB  7   IAB      Ectopic      Multiple      Live Births  1           Family History  Problem Relation Age of Onset   Hypertension Mother    Hyperlipidemia Mother    Kidney disease Father    Hypertension Father    Depression Father    Diabetes Father    Heart disease Father    Stroke Maternal Grandmother    Cancer Maternal Grandfather        lung    Stroke Paternal Grandmother    Heart disease Paternal Grandfather    Cancer Paternal Aunt        breast   Diabetes Paternal Aunt    Breast cancer Paternal Aunt     Social History   Tobacco Use   Smoking status: Every Day    Packs/day: 2.00    Years: 17.00    Pack years: 34.00    Types: Cigarettes   Smokeless tobacco: Never  Vaping Use   Vaping Use: Never used  Substance Use Topics   Alcohol use: No   Drug use: No    Home Medications Prior to Admission medications   Medication Sig Start Date End Date Taking? Authorizing Provider  ALPRAZolam Duanne Moron) 0.5 MG tablet  07/03/16   [provider]  busPIRone (BUSPAR) 10 MG tablet Take 1 tablet (10 mg total) by mouth 3 (three) times daily as needed. Patient not taking: No sig reported 09/16/20   Leftwich-Kirby, Lattie Haw A, CNM  cyclobenzaprine (FLEXERIL) 10 MG tablet Take 10 mg by  mouth 3 (three) times daily. 09/13/20   [provider]  doxycycline (VIBRAMYCIN) 100 MG capsule Take 1 capsule (100 mg total) by mouth 2 (two) times daily. Patient not taking: Reported on 04/22/2021 04/14/21   Guss Bunde, MD  esomeprazole (NEXIUM) 40 MG capsule Take 40 mg by mouth 2 (two) times daily. 04/27/20   [provider]  hydrOXYzine (VISTARIL) 25 MG capsule Take 1-2 capsules (25-50 mg total) by mouth 3 (three) times daily as needed. Patient not taking: No sig reported 09/16/20   Leftwich-Kirby, Kathie Dike, CNM  metoCLOPramide (REGLAN) 10 MG tablet Take 1 tablet (10 mg total) by mouth every 6 (six) hours as needed for nausea (nausea/headache). Patient not taking: No sig reported 02/15/20   Palumbo, April, MD  nortriptyline (PAMELOR) 10 MG capsule Take by mouth. Patient not taking: No sig reported 11/18/20   [provider]  ondansetron (ZOFRAN-ODT) 4 MG disintegrating tablet Take 4 mg by mouth every 6 (six) hours as needed. Patient not taking: No sig reported 12/12/20   [provider]  penicillin v potassium (VEETID)  500 MG tablet Take 500 mg by mouth 4 (four) times daily. Patient not taking: Reported on 04/22/2021 03/31/21   [provider]  potassium chloride 20 MEQ/15ML (10%) SOLN Take 30 mLs (40 mEq total) by mouth daily for 4 days. 04/24/21 04/28/21  Margette Fast, MD  potassium iodide (SSKI) 1 GM/ML solution Take by mouth. Patient not taking: No sig reported    [provider]  propranolol ER (INDERAL LA) 80 MG 24 hr capsule Take 80 mg by mouth daily. Patient not taking: No sig reported 05/18/18   [provider]  sucralfate (CARAFATE) 1 g tablet Take by mouth. Patient not taking: No sig reported 11/19/20   [provider]  tretinoin (RETIN-A) 0.05 % cream Pea sized amount to entire face nightly. Patient not taking: No sig reported 12/17/20   [provider]  zonisamide (ZONEGRAN) 100 MG capsule 100 mg 4 (four) times daily as needed. Patient not taking: No sig reported 01/18/19   [provider]  albuterol (ACCUNEB) 0.63 MG/3ML nebulizer solution Inhale into the lungs. 09/16/16 03/24/20  [provider]  norgestimate-ethinyl estradiol (ORTHO-CYCLEN,SPRINTEC,PREVIFEM) 0.25-35 MG-MCG tablet Take 1 tablet by mouth daily. 06/15/17 05/26/19  Morene Crocker, CNM    Allergies    Septra ds [sulfamethoxazole w/trimethoprim (co-trimoxazole)], Sulfa antibiotics, Cheese, Corn-containing products, Eggs or egg-derived products, Latex, Milk-related compounds, Peanut-containing drug products, and Shellfish allergy  Review of Systems   Review of Systems  Constitutional:  Negative for fever.  Cardiovascular:  Negative for chest pain.  Gastrointestinal:  Positive for abdominal pain, nausea and vomiting.  Neurological:  Positive for light-headedness.  All other systems reviewed and are negative.  Physical Exam Updated Vital Signs BP (!) 116/55   Pulse 87   Temp 98.3 F (36.8 C) (Oral)   Resp (!) 23   Ht 5\' 4"  (1.626 m)   Wt 54.6 kg   LMP 05/13/2021    SpO2 99%   BMI 20.66 kg/m   Physical Exam Vitals and nursing note reviewed.  Constitutional:      Appearance: She is well-developed.  HENT:     Head: Normocephalic and atraumatic.     Right Ear: External ear normal.     Left Ear: External ear normal.     Nose: Nose normal.  Eyes:     General:        Right eye: No discharge.  Left eye: No discharge.  Cardiovascular:     Rate and Rhythm: Regular rhythm. Tachycardia present.     Heart sounds: Normal heart sounds.     Comments: HR~100 Pulmonary:     Effort: Pulmonary effort is normal.     Breath sounds: Normal breath sounds.  Abdominal:     Palpations: Abdomen is soft.     Tenderness: There is generalized abdominal tenderness. There is no guarding.  Skin:    General: Skin is warm and dry.  Neurological:     Mental Status: She is alert.  Psychiatric:        Mood and Affect: Mood is not anxious.    ED Results / Procedures / Treatments   Labs (all labs ordered are listed, but only abnormal results are displayed) Labs Reviewed  CBC WITH DIFFERENTIAL/PLATELET - Abnormal; Notable for the following components:      Result Value   WBC 14.1 (*)    Hemoglobin 16.3 (*)    HCT 46.2 (*)    Neutro Abs 10.1 (*)    Monocytes Absolute 1.1 (*)    All other components within normal limits  COMPREHENSIVE METABOLIC PANEL - Abnormal; Notable for the following components:   Sodium 134 (*)    Potassium 3.3 (*)    Total Protein 8.2 (*)    Total Bilirubin 1.4 (*)    All other components within normal limits  LIPASE, BLOOD  URINALYSIS, ROUTINE W REFLEX MICROSCOPIC  PREGNANCY, URINE  CBG MONITORING, ED    EKG EKG Interpretation  Date/Time:  Sunday June 08 2021 03:25:57 EDT Ventricular Rate:  84 PR Interval:  144 QRS Duration: 77 QT Interval:  362 QTC Calculation: 428 R Axis:   75 Text Interpretation: Sinus rhythm Probable left atrial enlargement no acute ST/T changes Confirmed by Sherwood Gambler [phone]) on 06/08/2021  3:41:50 AM  Radiology No results found.  Procedures Procedures   Medications Ordered in ED Medications  lactated ringers bolus 1,000 mL (1,000 mLs Intravenous New Bag/Given 06/08/21 0346)  alum & mag hydroxide-simeth (MAALOX/MYLANTA) 200-200-20 MG/5ML suspension 30 mL (30 mLs Oral Given 06/08/21 0343)    And  lidocaine (XYLOCAINE) 2 % viscous mouth solution 15 mL (15 mLs Oral Given 06/08/21 0343)  metoCLOPramide (REGLAN) injection 10 mg (10 mg Intravenous Given 06/08/21 0341)    ED Course  I have reviewed the triage vital signs and the nursing notes.  Pertinent labs & imaging results that were available during my care of the patient were reviewed by me and considered in my medical decision making (see chart for details).    MDM Rules/Calculators/A&P                           Patient presents with recurrent abdominal pain and vomiting.  This is similar to many prior episodes.  She does have a leukocytosis but this is pretty typical for her and I suspect is secondary to vomiting.  Mild hypokalemia.  She is tolerating p.o. here and wants to go home.  She does not want to wait to give a urine sample and states there is no chance she is pregnant and wants to leave.  I think this is reasonable.  I do not think imaging is needed as I have a low suspicion for an acute intra-abdominal emergency.  Otherwise will discharge home and she was instructed to take her liquid potassium which she has at home she does not want to take any here. Final  Clinical Impression(s) / ED Diagnoses Final diagnoses:  Cyclic vomiting syndrome  Hypokalemia    Rx / DC Orders ED Discharge Orders     None        Sherwood Gambler, MD 06/08/21 [phone]

## 2021-06-08 NOTE — Discharge Instructions (Addendum)
Take your potassium when you get home as your potassium was mildly low here.  If you develop worsening, continued, or recurrent abdominal pain, uncontrolled vomiting, fever, chest or back pain, or any other new/concerning symptoms then return to the ER for evaluation.

## 2021-06-08 NOTE — ED Triage Notes (Signed)
Pt states she "had a cyclical vomiting episode" that started yesterday evening. She reports "I think I'm allergic to potatoes, I throw up every time I eat potatoes". She reports mid abdominal pain, and dizziness.

## 2021-07-22 ENCOUNTER — Ambulatory Visit: Payer: Medicaid Other | Admitting: Advanced Practice Midwife

## 2021-07-22 ENCOUNTER — Other Ambulatory Visit: Payer: Self-pay

## 2021-07-22 ENCOUNTER — Other Ambulatory Visit (HOSPITAL_COMMUNITY)
Admission: RE | Admit: 2021-07-22 | Discharge: 2021-07-22 | Disposition: A | Discharge status: Home / Self Care | Payer: Medicaid Other | Source: Ambulatory Visit | Attending: Advanced Practice Midwife | Admitting: Advanced Practice Midwife

## 2021-07-22 ENCOUNTER — Encounter: Payer: Self-pay | Admitting: Advanced Practice Midwife

## 2021-07-22 VITALS — BP 119/74 | HR 111 | Resp 16 | Ht 64.0 in | Wt 124.0 lb

## 2021-07-22 DIAGNOSIS — Z872 Personal history of diseases of the skin and subcutaneous tissue: Secondary | ICD-10-CM | POA: Diagnosis not present

## 2021-07-22 DIAGNOSIS — N898 Other specified noninflammatory disorders of vagina: Secondary | ICD-10-CM | POA: Diagnosis not present

## 2021-07-22 DIAGNOSIS — B3731 Acute candidiasis of vulva and vagina: Secondary | ICD-10-CM | POA: Diagnosis not present

## 2021-07-22 DIAGNOSIS — Z711 Person with feared health complaint in whom no diagnosis is made: Secondary | ICD-10-CM

## 2021-07-22 NOTE — Progress Notes (Signed)
   GYNECOLOGY PROGRESS NOTE  History:  40 y.o. G8P1070 presents to [facility] office today for problem gyn visit. She reports concerns about cyst on left groin/mons area and she feels 2 small bumps on the right and left labia minora. She is recently sexually active for the first time in years and is concerned.  She reports an increase in vaginal discharge, but no odor, itching, or burning.  She denies h/a, dizziness, shortness of breath, n/v, or fever/chills.    The following portions of the patient's history were reviewed and updated as appropriate: allergies, current medications, past family history, past medical history, past social history, past surgical history and problem list. Last pap smear on 07/04/2019 was normal, neg HRHPV.  Health Maintenance Due  Topic Date Due   COVID-19 Vaccine (1) Never done   Pneumococcal Vaccine 54-95 Years old (1 - PCV) Never done   INFLUENZA VACCINE  Never done     Review of Systems:  Pertinent items are noted in HPI.   Objective:  Physical Exam Blood pressure 119/74, pulse (!) 111, resp. rate 16, height 5\' 4"  (1.626 m), weight 124 lb (56.2 kg), last menstrual period 06/21/2021. VS reviewed, nursing note reviewed,  Constitutional: well developed, well nourished, no distress HEENT: normocephalic CV: normal rate Pulm/chest wall: normal effort Breast Exam: deferred Abdomen: soft Neuro: alert and oriented x 3 Skin: warm, dry Psych: affect normal Pelvic exam: On visual inspection, no visible lesions on mons or labia.  Pt attempted to find and point out lesions but is unable to feel them supine like she does in the shower.  Normal rugae of vagina/labia noted.  Assessment & Plan:  1. Vaginal discharge  - Cervicovaginal ancillary only( [place])  2. Physically well but worried --Reassurance provided to pt that no lesions are visible. Likely she is feeling normal rugae, possibly some inflammation while in hot shower that resolves, and  nothing abnormal.  3. History of folliculitis --Area with cyst on groin/mons with small scar where D&C was performed. Tiny firm mass, smaller than pea sized, palpable and is smooth, mobile, and round.  No fluctuation noted.  This is likely residual scarring or healing from hx of abscess but no evidence of current abscess or developing abscess seen.   Fatima Blank, CNM 3:23 PM

## 2021-07-23 LAB — CERVICOVAGINAL ANCILLARY ONLY
Bacterial Vaginitis (gardnerella): NEGATIVE
Candida Glabrata: NEGATIVE
Candida Vaginitis: POSITIVE — AB
Chlamydia: NEGATIVE
Comment: NEGATIVE
Comment: NEGATIVE
Comment: NEGATIVE
Comment: NEGATIVE
Comment: NEGATIVE
Comment: NORMAL
Neisseria Gonorrhea: NEGATIVE
Trichomonas: NEGATIVE

## 2021-07-25 ENCOUNTER — Other Ambulatory Visit: Payer: Self-pay

## 2021-07-25 ENCOUNTER — Encounter (HOSPITAL_BASED_OUTPATIENT_CLINIC_OR_DEPARTMENT_OTHER): Payer: Self-pay | Admitting: *Deleted

## 2021-07-25 ENCOUNTER — Emergency Department (HOSPITAL_BASED_OUTPATIENT_CLINIC_OR_DEPARTMENT_OTHER): Payer: Medicaid Other

## 2021-07-25 ENCOUNTER — Emergency Department (HOSPITAL_BASED_OUTPATIENT_CLINIC_OR_DEPARTMENT_OTHER)
Admission: EM | Admit: 2021-07-25 | Discharge: 2021-07-25 | Disposition: A | Discharge status: Home / Self Care | Payer: Medicaid Other | Attending: Emergency Medicine | Admitting: Emergency Medicine

## 2021-07-25 ENCOUNTER — Telehealth: Payer: Self-pay | Admitting: *Deleted

## 2021-07-25 DIAGNOSIS — S060X9A Concussion with loss of consciousness of unspecified duration, initial encounter: Secondary | ICD-10-CM | POA: Diagnosis not present

## 2021-07-25 DIAGNOSIS — F17210 Nicotine dependence, cigarettes, uncomplicated: Secondary | ICD-10-CM | POA: Diagnosis not present

## 2021-07-25 DIAGNOSIS — Z23 Encounter for immunization: Secondary | ICD-10-CM | POA: Diagnosis not present

## 2021-07-25 DIAGNOSIS — S0990XA Unspecified injury of head, initial encounter: Secondary | ICD-10-CM | POA: Diagnosis present

## 2021-07-25 DIAGNOSIS — Y92410 Unspecified street and highway as the place of occurrence of the external cause: Secondary | ICD-10-CM | POA: Diagnosis not present

## 2021-07-25 DIAGNOSIS — R6884 Jaw pain: Secondary | ICD-10-CM | POA: Diagnosis not present

## 2021-07-25 MED ORDER — HYDROCODONE-ACETAMINOPHEN 5-325 MG PO TABS
1.0000 | ORAL_TABLET | Freq: Once | ORAL | Status: AC
Start: 2021-07-25 — End: 2021-07-25
  Administered 2021-07-25: 1 via ORAL
  Filled 2021-07-25: qty 1

## 2021-07-25 MED ORDER — TETANUS-DIPHTH-ACELL PERTUSSIS 5-2.5-18.5 LF-MCG/0.5 IM SUSY
0.5000 mL | PREFILLED_SYRINGE | Freq: Once | INTRAMUSCULAR | Status: AC
  Administered 2021-07-25: 0.5 mL via INTRAMUSCULAR
  Filled 2021-07-25: qty 0.5

## 2021-07-25 MED ORDER — FLUCONAZOLE 150 MG PO TABS: 150.0000 mg | ORAL_TABLET | Freq: Once | ORAL | 0 refills | Status: AC

## 2021-07-25 MED ORDER — TERCONAZOLE 0.8 % VA CREA: 1.0000 | TOPICAL_CREAM | Freq: Every day | VAGINAL | 0 refills | Status: DC
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
## 2021-07-25 NOTE — ED Notes (Signed)
Patient discharged to home.  All discharge instructions reviewed.  Patient verbalized understanding via teachback method.  VS WDL.  Respirations even and unlabored.  Ambulatory out of ED.   °

## 2021-07-25 NOTE — Addendum Note (Signed)
Addended by: Fatima Blank A on: 07/25/2021 10:36 AM   Modules accepted: Orders
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
## 2021-07-25 NOTE — Discharge Instructions (Signed)
You were evaluated in the Emergency Department and after careful evaluation, we did not find any emergent condition requiring admission or further testing in the hospital.  Your exam/testing today was overall reassuring.  Recommend mental and physical rest as we discussed, Tylenol or Motrin for pain.  Please return to the Emergency Department if you experience any worsening of your condition.  Thank you for allowing [place] to be a part of your care.

## 2021-07-25 NOTE — Telephone Encounter (Signed)
-----   Message from Elvera Maria, CNM sent at 07/25/2021 10:36 AM EST ----- Pt is positive for yeast only.  I am sending her Diflucan to her pharmacy. I also sent Terazol 3.  She has a corn allergy but I think the Diflucan probably has very little. Can you ask her if she has taken it before?  She does not have a MyChart account.  I sent cream if she would rather use that.  Thank you.

## 2021-07-25 NOTE — Telephone Encounter (Signed)
Notified pt via phone to give her results of Aptima swab.  She is positive for yeast and L Leftwich-Kirby has sent in 2 RX for Diflucan and Terazol 7 cream.  Pt states that she has taken Diflucan before and it works fine.  No issues with the meds.

## 2022-02-22 IMAGING — US US EXTREM LOW VENOUS*R*
1 series · 14 of 24 positions shown · non-contrast
Comparison: None.

CLINICAL DATA: Right lower extremity numbness for the past 3 weeks

EXAM:
RIGHT LOWER EXTREMITY VENOUS DOPPLER ULTRASOUND
TECHNIQUE: Gray-scale sonography with compression, as well as color and duplex
ultrasound, were performed to evaluate the deep venous system(s)
from the level of the common femoral vein through the popliteal and
proximal calf veins.

[Series 1: us extrem low venous*right* · 14 of 42 slices shown]
[im 1/42]
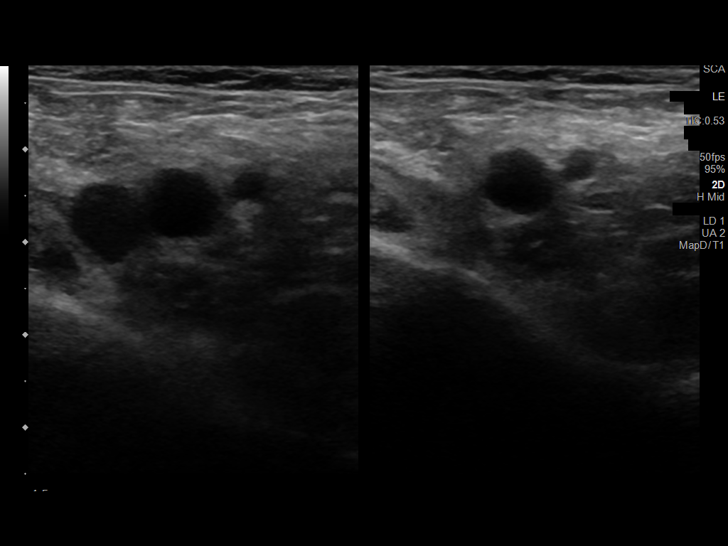
[im 4/42]
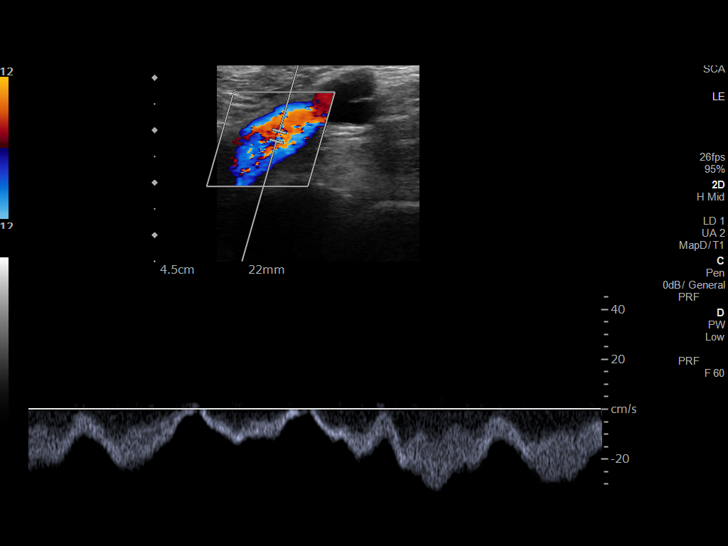
[im 8/42]
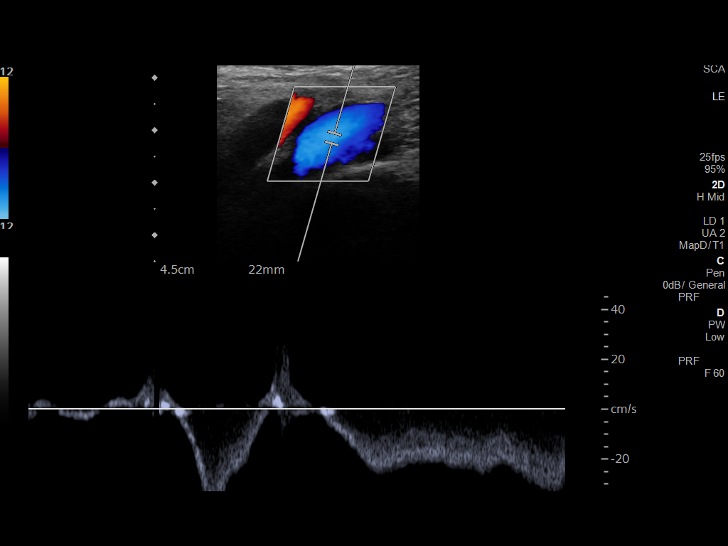
[im 11/42]
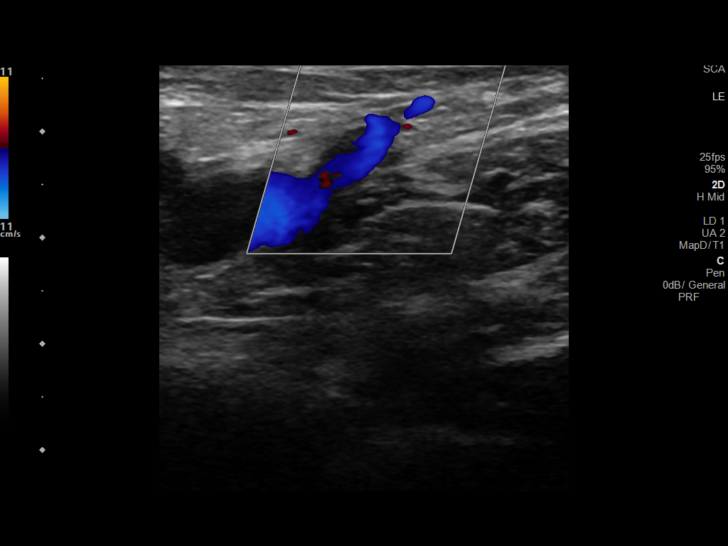
[im 13/42]
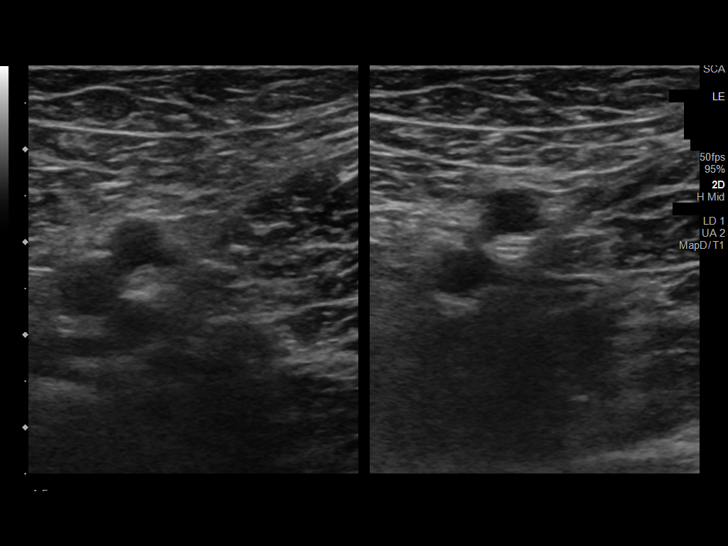
[im 17/42]
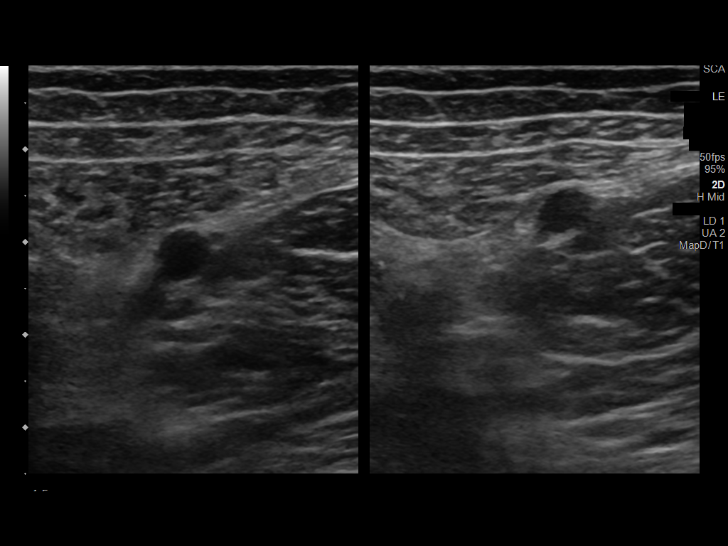
[im 20/42]
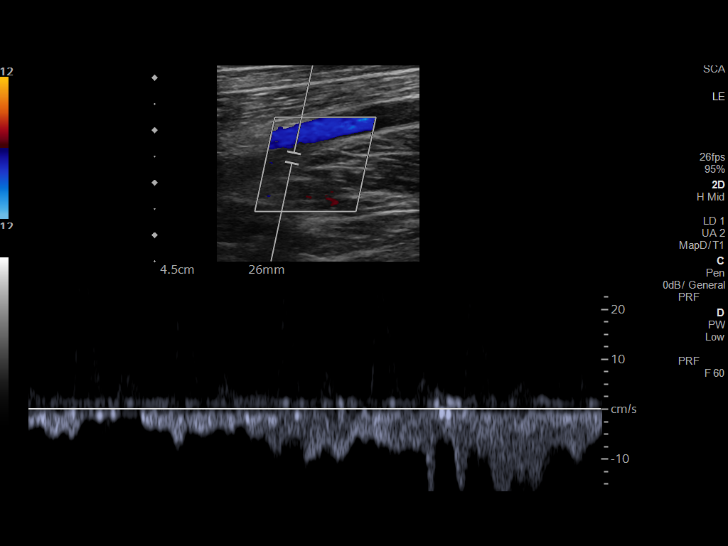
[im 22/42]
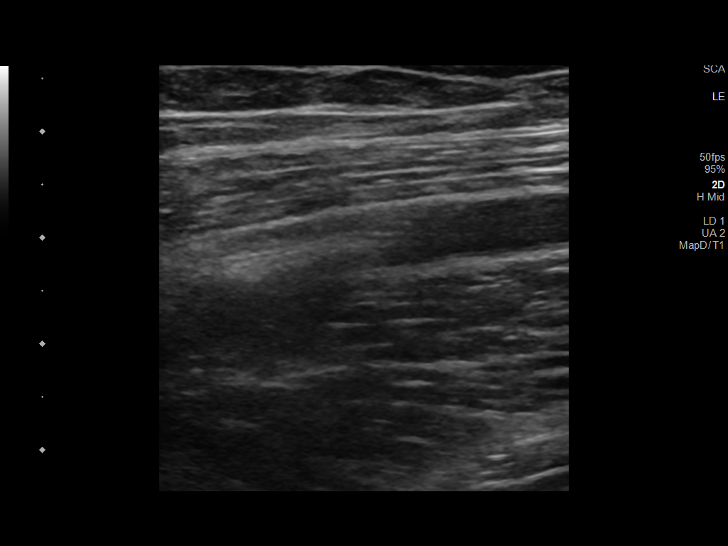
[im 25/42]
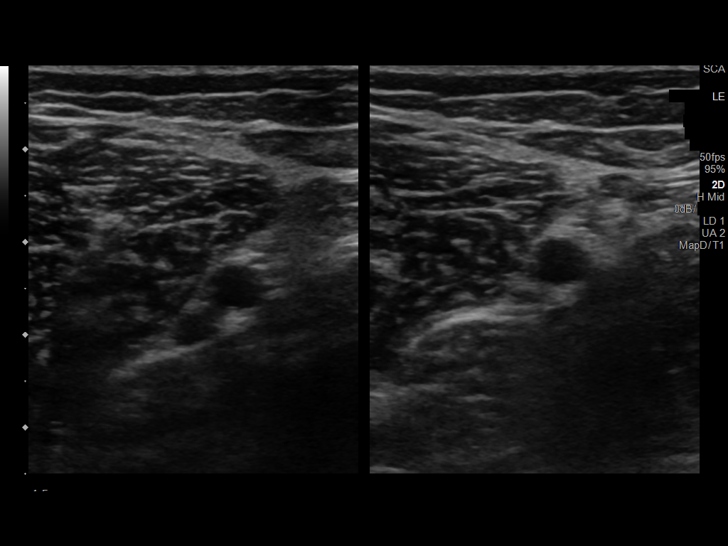
[im 29/42]
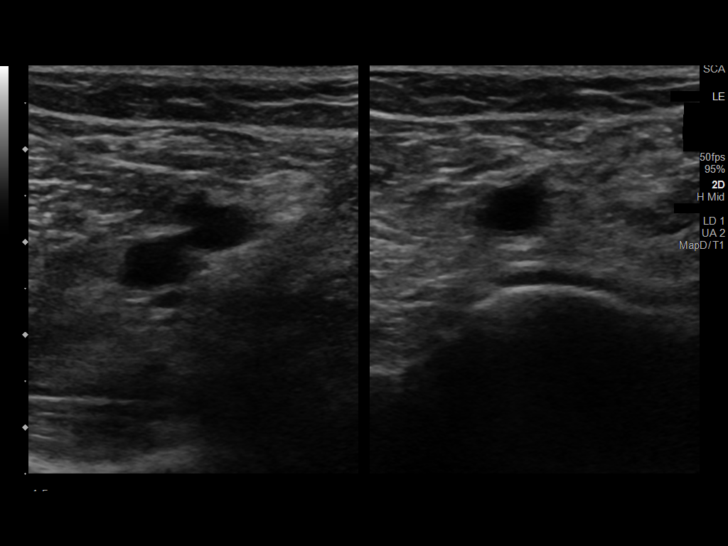
[im 33/42]
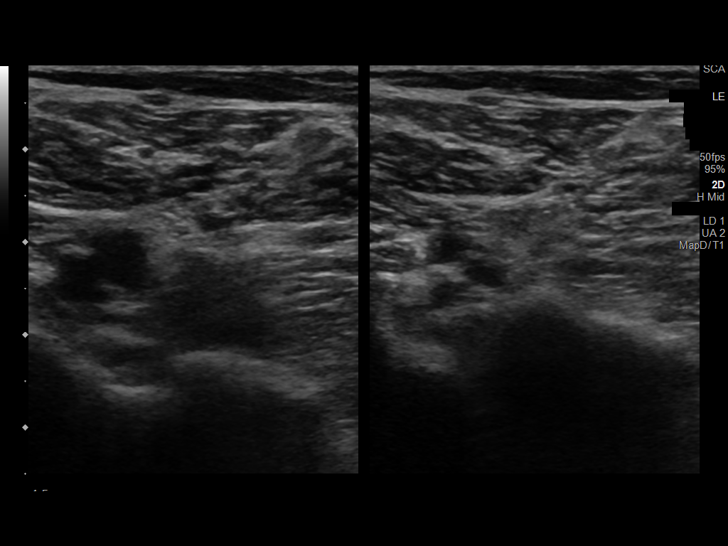
[im 34/42]
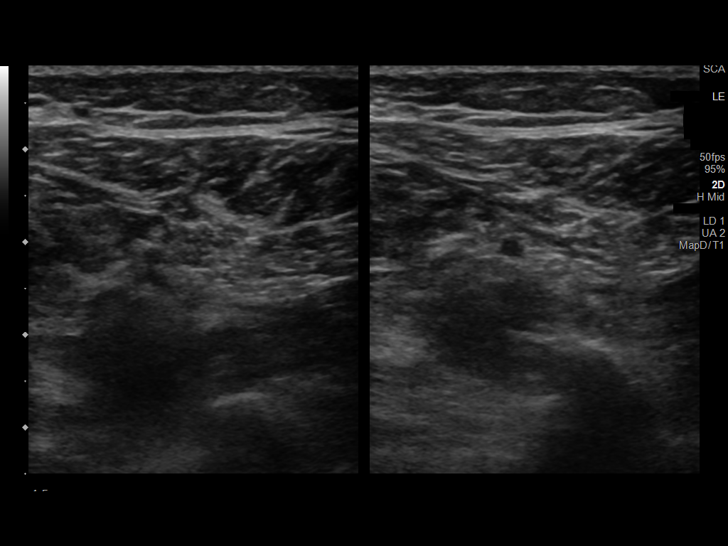
[im 38/42]
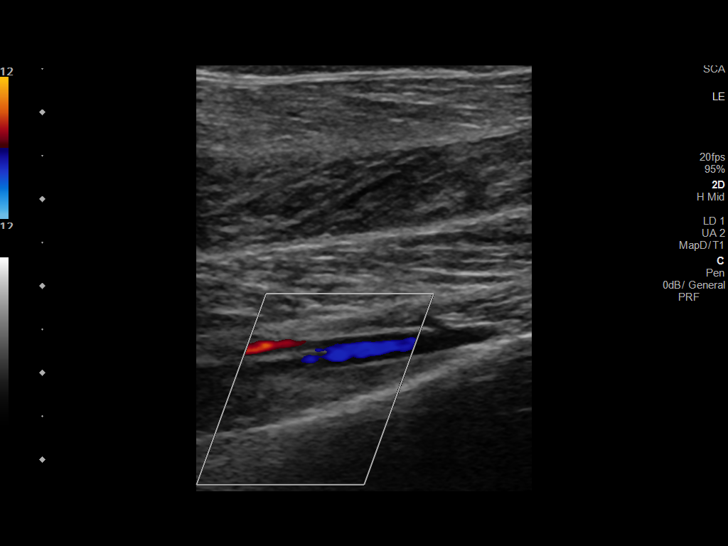
[im 42/42]
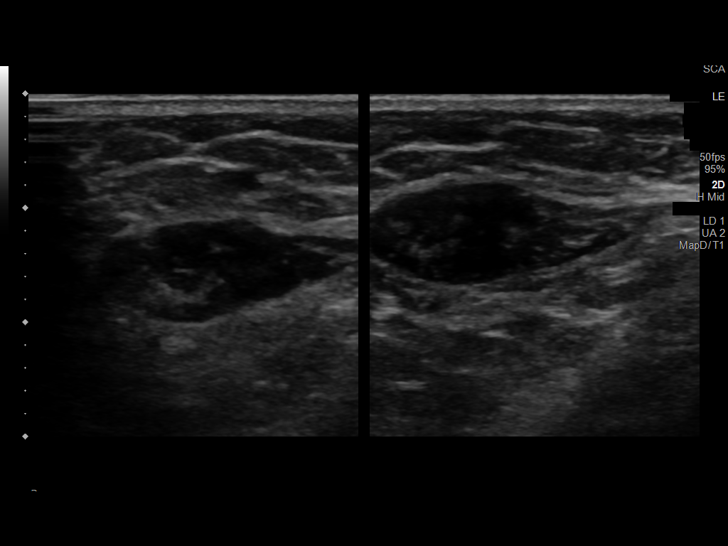

[14 of 24 positions shown; findings below may reference images not displayed]

FINDINGS: VENOUS

Normal compressibility of the common femoral, superficial femoral,
and popliteal veins, as well as the visualized calf veins.
Visualized portions of profunda femoral vein and great saphenous
vein unremarkable. No filling defects to suggest DVT on grayscale or
color Doppler imaging. Doppler waveforms show normal direction of
venous flow, normal respiratory plasticity and response to
augmentation.

Limited views of the contralateral common femoral vein are
unremarkable.

OTHER

None.

Limitations: none
IMPRESSION: Negative.

## 2022-06-22 ENCOUNTER — Other Ambulatory Visit: Payer: Self-pay

## 2022-06-22 DIAGNOSIS — B379 Candidiasis, unspecified: Secondary | ICD-10-CM

## 2022-06-22 MED ORDER — FLUCONAZOLE 150 MG PO TABS
150.0000 mg | ORAL_TABLET | Freq: Once | ORAL | 0 refills | Status: AC
Start: 2022-06-22 — End: 2022-06-22

## 2022-06-25 ENCOUNTER — Emergency Department (HOSPITAL_BASED_OUTPATIENT_CLINIC_OR_DEPARTMENT_OTHER)
Admission: EM | Admit: 2022-06-25 | Discharge: 2022-06-25 | Disposition: A | Discharge status: Home / Self Care | Payer: Medicaid Other | Attending: Emergency Medicine | Admitting: Emergency Medicine

## 2022-06-25 ENCOUNTER — Emergency Department (HOSPITAL_BASED_OUTPATIENT_CLINIC_OR_DEPARTMENT_OTHER): Payer: Medicaid Other

## 2022-06-25 ENCOUNTER — Other Ambulatory Visit: Payer: Self-pay

## 2022-06-25 ENCOUNTER — Encounter (HOSPITAL_BASED_OUTPATIENT_CLINIC_OR_DEPARTMENT_OTHER): Payer: Self-pay | Admitting: Emergency Medicine

## 2022-06-25 DIAGNOSIS — F17210 Nicotine dependence, cigarettes, uncomplicated: Secondary | ICD-10-CM | POA: Diagnosis not present

## 2022-06-25 DIAGNOSIS — S0990XA Unspecified injury of head, initial encounter: Secondary | ICD-10-CM | POA: Diagnosis not present

## 2022-06-25 DIAGNOSIS — R112 Nausea with vomiting, unspecified: Secondary | ICD-10-CM | POA: Diagnosis present

## 2022-06-25 DIAGNOSIS — R109 Unspecified abdominal pain: Secondary | ICD-10-CM | POA: Diagnosis not present

## 2022-06-25 LAB — COMPREHENSIVE METABOLIC PANEL
ALT: 10 U/L (ref 0–44)
AST: 15 U/L (ref 15–41)
Albumin: 4.4 g/dL (ref 3.5–5.0)
Alkaline Phosphatase: 111 U/L (ref 38–126)
Anion gap: 10 (ref 5–15)
BUN: 14 mg/dL (ref 6–20)
CO2: 24 mmol/L (ref 22–32)
Calcium: 9.1 mg/dL (ref 8.9–10.3)
Chloride: 100 mmol/L (ref 98–111)
Creatinine, Ser: 0.6 mg/dL (ref 0.44–1.00)
GFR, Estimated: >60 mL/min (ref >60)
Glucose, Bld: 125 mg/dL — ABNORMAL HIGH (ref 70–99)
Potassium: 3.3 mmol/L — ABNORMAL LOW (ref 3.5–5.1)
Sodium: 134 mmol/L — ABNORMAL LOW (ref 135–145)
Total Bilirubin: 1 mg/dL (ref 0.3–1.2)
Total Protein: 7.9 g/dL (ref 6.5–8.1)

## 2022-06-25 LAB — CBC
HCT: 41.7 % (ref 36.0–46.0)
Hemoglobin: 14.2 g/dL (ref 12.0–15.0)
MCH: 30.3 pg (ref 26.0–34.0)
MCHC: 34.1 g/dL (ref 30.0–36.0)
MCV: 89.1 fL (ref 80.0–100.0)
Platelets: 446 10*3/uL — ABNORMAL HIGH (ref 150–400)
RBC: 4.68 MIL/uL (ref 3.87–5.11)
RDW: 14 % (ref 11.5–15.5)
WBC: 17.4 10*3/uL — ABNORMAL HIGH (ref 4.0–10.5)
nRBC: 0 % (ref 0.0–0.2)

## 2022-06-25 LAB — HCG, SERUM, QUALITATIVE: Preg, Serum: NEGATIVE

## 2022-06-25 LAB — LIPASE, BLOOD: Lipase: 25 U/L (ref 11–51)

## 2022-06-25 MED ORDER — FAMOTIDINE IN NACL 20-0.9 MG/50ML-% IV SOLN
20.0000 mg | Freq: Once | INTRAVENOUS | Status: AC
  Administered 2022-06-25: 20 mg via INTRAVENOUS
  Filled 2022-06-25: qty 50

## 2022-06-25 MED ORDER — ONDANSETRON HCL 4 MG/2ML IJ SOLN
4.0000 mg | Freq: Once | INTRAMUSCULAR | Status: AC
  Administered 2022-06-25: 4 mg via INTRAVENOUS
  Filled 2022-06-25: qty 2

## 2022-06-25 MED ORDER — METOCLOPRAMIDE HCL 5 MG/ML IJ SOLN
10.0000 mg | Freq: Once | INTRAMUSCULAR | Status: AC
  Administered 2022-06-25: 10 mg via INTRAVENOUS
  Filled 2022-06-25: qty 2

## 2022-06-25 MED ORDER — ONDANSETRON 4 MG PO TBDP: 4.0000 mg | ORAL_TABLET | Freq: Three times a day (TID) | ORAL | 0 refills | Status: AC | PRN

## 2022-06-25 MED ORDER — ALUM & MAG HYDROXIDE-SIMETH 200-200-20 MG/5ML PO SUSP
30.0000 mL | Freq: Once | ORAL | Status: AC
  Administered 2022-06-25: 30 mL via ORAL
  Filled 2022-06-25: qty 30

## 2022-06-25 MED ORDER — SODIUM CHLORIDE 0.9 % IV BOLUS
1000.0000 mL | Freq: Once | INTRAVENOUS | Status: AC
  Administered 2022-06-25: 1000 mL via INTRAVENOUS

## 2022-06-25 NOTE — ED Notes (Signed)
ED Provider at bedside. 

## 2022-06-25 NOTE — Discharge Instructions (Addendum)
You were evaluated in the Emergency Department and after careful evaluation, we did not find any emergent condition requiring admission or further testing in the hospital.  Your exam/testing today was overall reassuring.  Recommend continued follow up with your Gi doctors.  Use the zofran as needed for nausea.  Please return to the Emergency Department if you experience any worsening of your condition.  Thank you for allowing [place] to be a part of your care.
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
## 2022-06-25 NOTE — ED Notes (Signed)
Patient transported to X-ray 

## 2022-06-25 NOTE — ED Triage Notes (Signed)
Pt states she has been vomiting since around 6am on Wednesday  Pt states she had coffee ground emesis earlier  Pt was seen by her dr in [place] on Wednesday and states her blood pressure was elevated at her visit

## 2022-06-25 NOTE — ED Notes (Signed)
Written and verbal inst to pt  Verbalized an understanding  To home  

## 2022-08-11 ENCOUNTER — Telehealth: Payer: Self-pay

## 2022-08-13 ENCOUNTER — Ambulatory Visit (INDEPENDENT_AMBULATORY_CARE_PROVIDER_SITE_OTHER): Payer: Medicaid Other | Admitting: Licensed Clinical Social Worker

## 2022-08-13 ENCOUNTER — Encounter: Payer: Self-pay | Admitting: Student

## 2022-08-13 ENCOUNTER — Other Ambulatory Visit (HOSPITAL_COMMUNITY)
Admission: RE | Admit: 2022-08-13 | Discharge: 2022-08-13 | Disposition: A | Discharge status: Home / Self Care | Payer: Medicaid Other | Source: Ambulatory Visit | Attending: Advanced Practice Midwife | Admitting: Advanced Practice Midwife

## 2022-08-13 ENCOUNTER — Ambulatory Visit (INDEPENDENT_AMBULATORY_CARE_PROVIDER_SITE_OTHER): Payer: Medicaid Other | Admitting: Student

## 2022-08-13 VITALS — BP 123/76 | HR 80 | Ht 63.0 in | Wt 137.0 lb

## 2022-08-13 DIAGNOSIS — Z124 Encounter for screening for malignant neoplasm of cervix: Secondary | ICD-10-CM

## 2022-08-13 DIAGNOSIS — Z202 Contact with and (suspected) exposure to infections with a predominantly sexual mode of transmission: Secondary | ICD-10-CM | POA: Diagnosis not present

## 2022-08-13 DIAGNOSIS — T7421XD Adult sexual abuse, confirmed, subsequent encounter: Secondary | ICD-10-CM

## 2022-08-13 MED ORDER — ELLA 30 MG PO TABS: 1.0000 | ORAL_TABLET | Freq: Once | ORAL | 0 refills | Status: AC
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
## 2022-08-14 LAB — HEPATITIS B SURFACE ANTIGEN: Hepatitis B Surface Ag: NEGATIVE

## 2022-08-14 LAB — CERVICOVAGINAL ANCILLARY ONLY
Chlamydia: NEGATIVE
Comment: NEGATIVE
Comment: NEGATIVE
Comment: NORMAL
Neisseria Gonorrhea: NEGATIVE
Trichomonas: NEGATIVE

## 2022-08-14 LAB — HEPATITIS C ANTIBODY: Hep C Virus Ab: NONREACTIVE

## 2022-08-14 LAB — HIV ANTIBODY (ROUTINE TESTING W REFLEX): HIV Screen 4th Generation wRfx: NONREACTIVE

## 2022-08-14 LAB — RPR: RPR Ser Ql: NONREACTIVE
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
## 2022-08-17 LAB — CYTOLOGY - PAP
Comment: NEGATIVE
Diagnosis: NEGATIVE
High risk HPV: NEGATIVE
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
## 2022-08-18 LAB — HERPES SIMPLEX VIRUS CULTURE

## 2022-08-25 ENCOUNTER — Telehealth: Payer: Self-pay | Admitting: Student

## 2022-08-25 NOTE — Telephone Encounter (Signed)
Telephone call to patient regarding her HSV culture collected on 08/13/22.  Patient was not in, no voicemail available.   The sample was not able to be processed due to the temperature it was received at.  This will need to be recollected if patient is still experiencing same symptoms.    Will send Mychart message as well.

## 2022-09-02 ENCOUNTER — Telehealth: Payer: Self-pay | Admitting: Emergency Medicine

## 2022-09-02 NOTE — Telephone Encounter (Signed)
Attempted TC to patient. Unable to leave voicemail.

## 2022-09-02 NOTE — Telephone Encounter (Signed)
-----   Message from Johnston Ebbs, NP sent at 08/26/2022  1:35 PM EST ----- Please inform patient of normal results and canceled HSV culture. See progress note from 08/13/22 for more information. Thank you

## 2022-09-15 ENCOUNTER — Other Ambulatory Visit (HOSPITAL_COMMUNITY)
Admission: RE | Admit: 2022-09-15 | Discharge: 2022-09-15 | Disposition: A | Discharge status: Home / Self Care | Payer: Medicaid Other | Source: Ambulatory Visit | Attending: Obstetrics and Gynecology | Admitting: Obstetrics and Gynecology

## 2022-09-15 ENCOUNTER — Ambulatory Visit (INDEPENDENT_AMBULATORY_CARE_PROVIDER_SITE_OTHER): Payer: Medicaid Other | Admitting: Obstetrics and Gynecology

## 2022-09-15 VITALS — BP 152/99 | HR 97 | Ht 63.0 in | Wt 138.6 lb

## 2022-09-15 DIAGNOSIS — T7421XD Adult sexual abuse, confirmed, subsequent encounter: Secondary | ICD-10-CM

## 2022-09-15 DIAGNOSIS — N644 Mastodynia: Secondary | ICD-10-CM | POA: Diagnosis not present

## 2022-09-15 DIAGNOSIS — N898 Other specified noninflammatory disorders of vagina: Secondary | ICD-10-CM

## 2022-09-15 DIAGNOSIS — Z113 Encounter for screening for infections with a predominantly sexual mode of transmission: Secondary | ICD-10-CM | POA: Diagnosis present

## 2022-09-15 MED ORDER — FLUCONAZOLE 150 MG PO TABS: 150.0000 mg | ORAL_TABLET | Freq: Once | ORAL | 0 refills | Status: AC
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
## 2022-09-15 NOTE — Addendum Note (Signed)
Addended by: Barkley Boards on: 09/15/2022 02:48 PM   Modules accepted: Orders

## 2022-09-16 LAB — CERVICOVAGINAL ANCILLARY ONLY
Bacterial Vaginitis (gardnerella): NEGATIVE
Candida Glabrata: NEGATIVE
Candida Vaginitis: NEGATIVE
Chlamydia: NEGATIVE
Comment: NEGATIVE
Comment: NEGATIVE
Comment: NEGATIVE
Comment: NEGATIVE
Comment: NEGATIVE
Comment: NORMAL
Neisseria Gonorrhea: NEGATIVE
Trichomonas: NEGATIVE

## 2022-09-17 ENCOUNTER — Other Ambulatory Visit: Payer: Medicaid Other

## 2022-09-17 DIAGNOSIS — Z113 Encounter for screening for infections with a predominantly sexual mode of transmission: Secondary | ICD-10-CM

## 2022-09-18 LAB — RPR: RPR Ser Ql: NONREACTIVE

## 2022-09-18 LAB — HEPATITIS B SURFACE ANTIGEN: Hepatitis B Surface Ag: NEGATIVE

## 2022-09-18 LAB — HIV ANTIBODY (ROUTINE TESTING W REFLEX): HIV Screen 4th Generation wRfx: NONREACTIVE

## 2022-09-18 LAB — HEPATITIS C ANTIBODY: Hep C Virus Ab: NONREACTIVE

## 2022-10-05 ENCOUNTER — Other Ambulatory Visit: Payer: Self-pay | Admitting: Obstetrics and Gynecology

## 2022-10-05 DIAGNOSIS — N6310 Unspecified lump in the right breast, unspecified quadrant: Secondary | ICD-10-CM

## 2022-10-05 DIAGNOSIS — N644 Mastodynia: Secondary | ICD-10-CM

## 2022-10-19 ENCOUNTER — Other Ambulatory Visit: Payer: Medicaid Other

## 2022-10-19 ENCOUNTER — Inpatient Hospital Stay: Admission: RE | Admit: 2022-10-19 | Payer: Medicaid Other | Source: Ambulatory Visit

## 2022-11-05 ENCOUNTER — Other Ambulatory Visit: Payer: Medicaid Other

## 2022-11-12 ENCOUNTER — Other Ambulatory Visit: Payer: Medicaid Other

## 2023-01-05 ENCOUNTER — Ambulatory Visit: Payer: Medicaid Other | Admitting: Family

## 2023-02-03 ENCOUNTER — Other Ambulatory Visit: Payer: Self-pay

## 2023-02-03 DIAGNOSIS — B379 Candidiasis, unspecified: Secondary | ICD-10-CM

## 2023-02-03 MED ORDER — FLUCONAZOLE 150 MG PO TABS: 150.0000 mg | ORAL_TABLET | Freq: Once | ORAL | 0 refills | Status: AC

## 2023-02-11 ENCOUNTER — Ambulatory Visit (INDEPENDENT_AMBULATORY_CARE_PROVIDER_SITE_OTHER): Payer: Medicaid Other | Admitting: Emergency Medicine

## 2023-02-11 ENCOUNTER — Other Ambulatory Visit (HOSPITAL_COMMUNITY)
Admission: RE | Admit: 2023-02-11 | Discharge: 2023-02-11 | Disposition: A | Discharge status: Home / Self Care | Payer: Medicaid Other | Source: Ambulatory Visit | Attending: Obstetrics and Gynecology | Admitting: Obstetrics and Gynecology

## 2023-02-11 VITALS — BP 136/88 | HR 104 | Wt 147.0 lb

## 2023-02-11 DIAGNOSIS — Z113 Encounter for screening for infections with a predominantly sexual mode of transmission: Secondary | ICD-10-CM | POA: Insufficient documentation

## 2023-02-11 DIAGNOSIS — N898 Other specified noninflammatory disorders of vagina: Secondary | ICD-10-CM | POA: Insufficient documentation

## 2023-02-11 NOTE — Progress Notes (Addendum)
SUBJECTIVE:  42 y.o. female complains of clear vaginal discharge for 1 week(s). Denies abnormal vaginal bleeding or significant pelvic pain or fever. No UTI symptoms. Denies history of known exposure to STD. C/O bilateral breast pain,and requests mammogram to be re ordered. Requests STI blood work.  LMP: 01/11/2023  OBJECTIVE:  She appears well, afebrile. Urine dipstick: not done.  ASSESSMENT:  Vaginal Discharge  Vaginal Odor   PLAN:  GC, chlamydia, trichomonas, BVAG, CVAG probe sent to lab.  Treatment: To be determined once lab results are received ROV prn if symptoms persist or worsen.

## 2023-02-12 ENCOUNTER — Other Ambulatory Visit: Payer: Medicaid Other

## 2023-02-12 LAB — CERVICOVAGINAL ANCILLARY ONLY
Bacterial Vaginitis (gardnerella): NEGATIVE
Candida Glabrata: NEGATIVE
Candida Vaginitis: NEGATIVE
Chlamydia: NEGATIVE
Comment: NEGATIVE
Comment: NEGATIVE
Comment: NEGATIVE
Comment: NEGATIVE
Comment: NEGATIVE
Comment: NORMAL
Neisseria Gonorrhea: NEGATIVE
Trichomonas: NEGATIVE

## 2023-02-15 ENCOUNTER — Other Ambulatory Visit: Payer: Medicaid Other

## 2023-02-18 ENCOUNTER — Other Ambulatory Visit: Payer: Medicaid Other

## 2023-02-19 LAB — HEPATITIS C ANTIBODY: Hep C Virus Ab: NONREACTIVE

## 2023-02-19 LAB — HEPATITIS B SURFACE ANTIGEN: Hepatitis B Surface Ag: NEGATIVE

## 2023-02-19 LAB — SYPHILIS: RPR W/REFLEX TO RPR TITER AND TREPONEMAL ANTIBODIES, TRADITIONAL SCREENING AND DIAGNOSIS ALGORITHM: RPR Ser Ql: NONREACTIVE

## 2023-02-19 LAB — HIV ANTIBODY (ROUTINE TESTING W REFLEX): HIV Screen 4th Generation wRfx: NONREACTIVE

## 2023-03-29 ENCOUNTER — Other Ambulatory Visit: Payer: Self-pay

## 2023-03-29 ENCOUNTER — Telehealth: Payer: Self-pay

## 2023-03-29 DIAGNOSIS — B379 Candidiasis, unspecified: Secondary | ICD-10-CM

## 2023-03-29 MED ORDER — FLUCONAZOLE 150 MG PO TABS
150.0000 mg | ORAL_TABLET | Freq: Once | ORAL | 0 refills | Status: AC
Start: 2023-03-29 — End: 2023-03-29

## 2023-03-29 NOTE — Telephone Encounter (Signed)
Pt called stating she is having intense vaginal itching and burning. Pt states she is not sexually active and is on her cycle. Pt states this is a yeast infection and requesting medication. Advised pt I would treat per protocol. Pt states she can take diflucan and tolerates it well.    Rx sent.

## 2023-04-01 ENCOUNTER — Other Ambulatory Visit: Payer: Self-pay

## 2023-04-01 ENCOUNTER — Ambulatory Visit
Admission: RE | Admit: 2023-04-01 | Discharge: 2023-04-01 | Disposition: A | Discharge status: Home / Self Care | Payer: Medicaid Other | Source: Ambulatory Visit | Attending: Obstetrics and Gynecology | Admitting: Obstetrics and Gynecology

## 2023-04-01 ENCOUNTER — Telehealth: Payer: Self-pay

## 2023-04-01 DIAGNOSIS — N6310 Unspecified lump in the right breast, unspecified quadrant: Secondary | ICD-10-CM

## 2023-04-01 DIAGNOSIS — N644 Mastodynia: Secondary | ICD-10-CM

## 2023-04-01 DIAGNOSIS — B3731 Acute candidiasis of vulva and vagina: Secondary | ICD-10-CM

## 2023-04-01 MED ORDER — TERCONAZOLE 0.8 % VA CREA
1.0000 | TOPICAL_CREAM | Freq: Every day | VAGINAL | 0 refills | Status: AC
Start: 2023-04-01 — End: ?

## 2023-04-01 NOTE — Telephone Encounter (Signed)
Pt called requesting terazol cream for vaginal yeast. Rx sent. Pt notified if this does not work, she will need a self swab.

## 2023-04-13 ENCOUNTER — Ambulatory Visit (INDEPENDENT_AMBULATORY_CARE_PROVIDER_SITE_OTHER): Payer: Medicaid Other | Admitting: Obstetrics and Gynecology

## 2023-04-13 ENCOUNTER — Other Ambulatory Visit (HOSPITAL_COMMUNITY)
Admission: RE | Admit: 2023-04-13 | Discharge: 2023-04-13 | Disposition: A | Discharge status: Home / Self Care | Payer: Medicaid Other | Source: Ambulatory Visit | Attending: Obstetrics and Gynecology | Admitting: Obstetrics and Gynecology

## 2023-04-13 VITALS — BP 145/99 | HR 103 | Wt 160.0 lb

## 2023-04-13 DIAGNOSIS — N898 Other specified noninflammatory disorders of vagina: Secondary | ICD-10-CM | POA: Diagnosis not present

## 2023-04-13 NOTE — Progress Notes (Signed)
   RETURN GYNECOLOGY VISIT  Subjective:  Natalie Joseph is a 42 y.o. [phone] with LMP 03/2023 presenting for vaginal discharge.   7/22 - called with VVC symptoms and diflucan rx sent. Initially started to get better, then got her period and used new tampons and symptoms got worse again 7/25 - called and requested terazol, rx sent. Symptoms partially improved.   Today (8/6) - reports ongoing fishy odorous vaginal discharge with some vulvar/vaginal itching. Symptoms are extremely bothersome.   Objective:   Vitals:   04/13/23 1355 04/13/23 1425  BP: (!) 146/100 (!) 145/99  Pulse:  (!) 103  Weight: 160 lb (72.6 kg)    General:  Alert, oriented and cooperative. Patient is in no acute distress.  Skin: Skin is warm and dry. No rash noted.   Cardiovascular: Normal heart rate noted  Respiratory: Normal respiratory effort, no problems with respiration noted  Abdomen: Soft, non-tender, non-distended   Pelvic: Mild erythema on labia majora bilaterally, no warmth or tenderness. Introitus also erythematous. No abnormal appearing discharge or odor.   Exam performed in the presence of a chaperone  Assessment and Plan:  Natalie Joseph is a 41 y.o. with suspected bacterial vaginosis versus VVC versus contact/irritant dermatitis  1. Vaginal discharge - Cervicovaginal ancillary only( [facility]) Discussed waiting to treat until swab is back, pt agreeable Reviewed general vulvar care, can use emollient nightly to help with irritation and protect skin  Return if symptoms worsen or fail to improve.  Lennart Pall, MD

## 2023-04-13 NOTE — Patient Instructions (Signed)
Try putting a thin layer of plain vaseline over the affected areas to help protect your skin.   We will call with your swab results

## 2023-04-13 NOTE — Progress Notes (Signed)
Pt was recently tx for yeast - was treated- used Diflucan and cream. Pt states she is still having some symptoms.

## 2023-05-18 ENCOUNTER — Emergency Department (HOSPITAL_BASED_OUTPATIENT_CLINIC_OR_DEPARTMENT_OTHER)
Admission: EM | Admit: 2023-05-18 | Discharge: 2023-05-18 | Discharge status: Against Medical Advice | Payer: Medicaid Other | Attending: Emergency Medicine | Admitting: Emergency Medicine

## 2023-05-18 ENCOUNTER — Encounter (HOSPITAL_BASED_OUTPATIENT_CLINIC_OR_DEPARTMENT_OTHER): Payer: Self-pay | Admitting: Emergency Medicine

## 2023-05-18 ENCOUNTER — Other Ambulatory Visit: Payer: Self-pay

## 2023-05-18 DIAGNOSIS — R1115 Cyclical vomiting syndrome unrelated to migraine: Secondary | ICD-10-CM | POA: Insufficient documentation

## 2023-05-18 DIAGNOSIS — Z5321 Procedure and treatment not carried out due to patient leaving prior to being seen by health care provider: Secondary | ICD-10-CM | POA: Diagnosis not present

## 2023-05-18 DIAGNOSIS — R34 Anuria and oliguria: Secondary | ICD-10-CM | POA: Diagnosis not present

## 2023-05-18 LAB — URINALYSIS, ROUTINE W REFLEX MICROSCOPIC
Bilirubin Urine: NEGATIVE
Glucose, UA: NEGATIVE mg/dL
Ketones, ur: NEGATIVE mg/dL
Nitrite: POSITIVE — AB
Protein, ur: 100 mg/dL — AB
Specific Gravity, Urine: 1.016 (ref 1.005–1.030)
WBC, UA: >50 WBC/hpf (ref 0–5)
pH: 6.5 (ref 5.0–8.0)

## 2023-05-18 LAB — CBC
HCT: 45.1 % (ref 36.0–46.0)
Hemoglobin: 15.4 g/dL — ABNORMAL HIGH (ref 12.0–15.0)
MCH: 32.8 pg (ref 26.0–34.0)
MCHC: 34.1 g/dL (ref 30.0–36.0)
MCV: 96.2 fL (ref 80.0–100.0)
Platelets: 378 10*3/uL (ref 150–400)
RBC: 4.69 MIL/uL (ref 3.87–5.11)
RDW: 13.9 % (ref 11.5–15.5)
WBC: 15.3 10*3/uL — ABNORMAL HIGH (ref 4.0–10.5)
nRBC: 0 % (ref 0.0–0.2)

## 2023-05-18 LAB — PREGNANCY, URINE: Preg Test, Ur: NEGATIVE

## 2023-05-18 LAB — COMPREHENSIVE METABOLIC PANEL
ALT: 15 U/L (ref 0–44)
AST: 19 U/L (ref 15–41)
Albumin: 4.7 g/dL (ref 3.5–5.0)
Alkaline Phosphatase: 95 U/L (ref 38–126)
Anion gap: 12 (ref 5–15)
BUN: 10 mg/dL (ref 6–20)
CO2: 28 mmol/L (ref 22–32)
Calcium: 9.5 mg/dL (ref 8.9–10.3)
Chloride: 96 mmol/L — ABNORMAL LOW (ref 98–111)
Creatinine, Ser: 0.91 mg/dL (ref 0.44–1.00)
GFR, Estimated: >60 mL/min (ref >60)
Glucose, Bld: 113 mg/dL — ABNORMAL HIGH (ref 70–99)
Potassium: 3.5 mmol/L (ref 3.5–5.1)
Sodium: 136 mmol/L (ref 135–145)
Total Bilirubin: 0.9 mg/dL (ref 0.3–1.2)
Total Protein: 8.1 g/dL (ref 6.5–8.1)

## 2023-05-18 LAB — LIPASE, BLOOD: Lipase: 18 U/L (ref 11–51)

## 2023-05-18 NOTE — ED Triage Notes (Signed)
Vomiting x 28 hrs. Cyclical vomiting Also reports oliguria "I think I have a UTI"

## 2023-05-20 ENCOUNTER — Other Ambulatory Visit: Payer: Self-pay

## 2023-05-20 ENCOUNTER — Emergency Department (HOSPITAL_BASED_OUTPATIENT_CLINIC_OR_DEPARTMENT_OTHER)
Admission: EM | Admit: 2023-05-20 | Discharge: 2023-05-21 | Discharge status: Against Medical Advice | Payer: Medicaid Other | Attending: Emergency Medicine | Admitting: Emergency Medicine

## 2023-05-20 ENCOUNTER — Encounter (HOSPITAL_BASED_OUTPATIENT_CLINIC_OR_DEPARTMENT_OTHER): Payer: Self-pay | Admitting: Emergency Medicine

## 2023-05-20 DIAGNOSIS — Z91040 Latex allergy status: Secondary | ICD-10-CM | POA: Diagnosis not present

## 2023-05-20 DIAGNOSIS — K2900 Acute gastritis without bleeding: Secondary | ICD-10-CM | POA: Insufficient documentation

## 2023-05-20 DIAGNOSIS — Z91010 Allergy to peanuts: Secondary | ICD-10-CM | POA: Diagnosis not present

## 2023-05-20 DIAGNOSIS — R000 Tachycardia, unspecified: Secondary | ICD-10-CM | POA: Insufficient documentation

## 2023-05-20 DIAGNOSIS — K209 Esophagitis, unspecified without bleeding: Secondary | ICD-10-CM | POA: Insufficient documentation

## 2023-05-20 DIAGNOSIS — R112 Nausea with vomiting, unspecified: Secondary | ICD-10-CM

## 2023-05-20 DIAGNOSIS — R109 Unspecified abdominal pain: Secondary | ICD-10-CM | POA: Diagnosis present

## 2023-05-20 LAB — COMPREHENSIVE METABOLIC PANEL
ALT: 18 U/L (ref 0–44)
AST: 26 U/L (ref 15–41)
Albumin: 4.5 g/dL (ref 3.5–5.0)
Alkaline Phosphatase: 94 U/L (ref 38–126)
Anion gap: 13 (ref 5–15)
BUN: 11 mg/dL (ref 6–20)
CO2: 22 mmol/L (ref 22–32)
Calcium: 9.1 mg/dL (ref 8.9–10.3)
Chloride: 95 mmol/L — ABNORMAL LOW (ref 98–111)
Creatinine, Ser: 0.75 mg/dL (ref 0.44–1.00)
GFR, Estimated: >60 mL/min (ref >60)
Glucose, Bld: 136 mg/dL — ABNORMAL HIGH (ref 70–99)
Potassium: 4 mmol/L (ref 3.5–5.1)
Sodium: 130 mmol/L — ABNORMAL LOW (ref 135–145)
Total Bilirubin: 1.3 mg/dL — ABNORMAL HIGH (ref 0.3–1.2)
Total Protein: 8.6 g/dL — ABNORMAL HIGH (ref 6.5–8.1)

## 2023-05-20 LAB — CBC WITH DIFFERENTIAL/PLATELET
Abs Immature Granulocytes: 0.16 10*3/uL — ABNORMAL HIGH (ref 0.00–0.07)
Basophils Absolute: 0.1 10*3/uL (ref 0.0–0.1)
Basophils Relative: 0 %
Eosinophils Absolute: 0 10*3/uL (ref 0.0–0.5)
Eosinophils Relative: 0 %
HCT: 43.1 % (ref 36.0–46.0)
Hemoglobin: 14.8 g/dL (ref 12.0–15.0)
Immature Granulocytes: 1 %
Lymphocytes Relative: 5 %
Lymphs Abs: 1.1 10*3/uL (ref 0.7–4.0)
MCH: 32.8 pg (ref 26.0–34.0)
MCHC: 34.3 g/dL (ref 30.0–36.0)
MCV: 95.6 fL (ref 80.0–100.0)
Monocytes Absolute: 1.8 10*3/uL — ABNORMAL HIGH (ref 0.1–1.0)
Monocytes Relative: 8 %
Neutro Abs: 20 10*3/uL — ABNORMAL HIGH (ref 1.7–7.7)
Neutrophils Relative %: 86 %
Platelets: 352 10*3/uL (ref 150–400)
RBC: 4.51 MIL/uL (ref 3.87–5.11)
RDW: 13.3 % (ref 11.5–15.5)
WBC: 23.2 10*3/uL — ABNORMAL HIGH (ref 4.0–10.5)
nRBC: 0 % (ref 0.0–0.2)

## 2023-05-20 LAB — LIPASE, BLOOD: Lipase: 23 U/L (ref 11–51)

## 2023-05-20 MED ORDER — SODIUM CHLORIDE 0.9 % IV BOLUS
1000.0000 mL | Freq: Once | INTRAVENOUS | Status: AC
  Administered 2023-05-20: 1000 mL via INTRAVENOUS

## 2023-05-20 MED ORDER — ONDANSETRON HCL 4 MG/2ML IJ SOLN
4.0000 mg | Freq: Once | INTRAMUSCULAR | Status: AC
  Administered 2023-05-20: 4 mg via INTRAVENOUS
  Filled 2023-05-20: qty 2

## 2023-05-20 MED ORDER — PANTOPRAZOLE SODIUM 40 MG IV SOLR
40.0000 mg | Freq: Once | INTRAVENOUS | Status: AC
  Administered 2023-05-21: 40 mg via INTRAVENOUS
  Filled 2023-05-20: qty 10

## 2023-05-20 NOTE — ED Triage Notes (Signed)
Pt c/o continued vomiting; c/o general abd pain; sts she vomited blood today

## 2023-05-20 NOTE — ED Provider Notes (Signed)
[place] [facility] AT [facility] Provider Note   CSN: [id number] Arrival date & time: 05/20/23  2138     History  Chief Complaint  Patient presents with   Emesis    Natalie Joseph is a 42 y.o. female.  Patient with a history of cyclic vomiting syndrome, fibromyalgia, gastritis, pheochromocytoma presenting with what she believes is a flare of her cyclic vomiting syndrome.  States for the past 3 to 4 days she has had nausea and vomiting and unable to keep anything down at home.  Emesis started as clear and yellow and today has been "coffee grounds".  States she is vomiting "every 10 to 15 minutes."  No diarrhea.  Has not had a bowel movement for 4 to 5 days.  No fever.  Diffuse lower abdominal pain similar to previous episodes of her cyclic vomiting syndrome.  Believes she has UTI as well as been on antibiotics from her PCP for the past 2 days.  Given Cipro she thinks but is not certain.  She has not had any blood in her stools.  States she was told by her PCP that her white blood cell count was elevated. Denies any alcohol or drug use.  Had an EGD in the past that showed ulcers by her report.  The history is provided by the patient.  Emesis Associated symptoms: abdominal pain   Associated symptoms: no arthralgias, no cough, no fever, no headaches and no myalgias        Home Medications Prior to Admission medications   Medication Sig Start Date End Date Taking? Authorizing Provider  ALPRAZolam Prudy Feeler) 0.5 MG tablet  07/03/16   [provider]  busPIRone (BUSPAR) 10 MG tablet Take 1 tablet (10 mg total) by mouth 3 (three) times daily as needed. Patient not taking: Reported on 08/13/2022 09/16/20   Sharen Counter A, CNM  cyclobenzaprine (FLEXERIL) 10 MG tablet Take 10 mg by mouth 3 (three) times daily. 09/13/20   [provider]  esomeprazole (NEXIUM) 40 MG capsule Take 40 mg by mouth 2 (two) times daily. 04/27/20   [provider]  hydrOXYzine (VISTARIL) 25 MG capsule Take 1-2 capsules (25-50 mg total) by mouth 3 (three) times daily as needed. Patient not taking: Reported on 08/13/2022 09/16/20   Leftwich-Kirby, Wilmer Floor, CNM  metoCLOPramide (REGLAN) 10 MG tablet Take 1 tablet (10 mg total) by mouth every 6 (six) hours as needed for nausea (nausea/headache). 02/15/20   Palumbo, April, MD  nortriptyline (PAMELOR) 10 MG capsule Take by mouth. Patient not taking: Reported on 01/21/2021 11/18/20   [provider]  ondansetron (ZOFRAN-ODT) 4 MG disintegrating tablet Take 4 mg by mouth every 6 (six) hours as needed. Patient not taking: Reported on 01/21/2021 12/12/20   [provider]  ondansetron (ZOFRAN-ODT) 4 MG disintegrating tablet Take 1 tablet (4 mg total) by mouth every 8 (eight) hours as needed for nausea or vomiting. 06/25/22   Sabas Sous, MD  PARoxetine (PAXIL) 10 MG tablet Take 10 mg by mouth daily. Patient not taking: Reported on 02/11/2023    [provider]  penicillin v potassium (VEETID) 500 MG tablet Take 500 mg by mouth 4 (four) times daily. Patient not taking: Reported on 04/22/2021 03/31/21   [provider]  potassium chloride 20 MEQ/15ML (10%) SOLN Take 30 mLs (40 mEq total) by mouth daily for 4 days. 04/24/21 04/28/21  Maia Plan, MD  potassium iodide (SSKI) 1 GM/ML solution Take by mouth.  [provider]  propranolol ER (INDERAL LA) 80 MG 24 hr capsule Take 80 mg by mouth daily. Patient not taking: Reported on 01/21/2021 05/18/18   [provider]  sucralfate (CARAFATE) 1 g tablet Take by mouth. Patient not taking: Reported on 08/13/2022 11/19/20   [provider]  terconazole (TERAZOL 3) 0.8 % vaginal cream Place 1 applicator vaginally at bedtime. 04/01/23   Constant, Peggy, MD  zonisamide (ZONEGRAN) 100 MG capsule 100 mg 4 (four) times daily as needed. Patient not taking: Reported on 01/21/2021 01/18/19   [provider]   albuterol (ACCUNEB) 0.63 MG/3ML nebulizer solution Inhale into the lungs. 09/16/16 03/24/20  [provider]  norgestimate-ethinyl estradiol (ORTHO-CYCLEN,SPRINTEC,PREVIFEM) 0.25-35 MG-MCG tablet Take 1 tablet by mouth daily. 06/15/17 05/26/19  Roe Coombs, CNM      Allergies    Septra ds [sulfamethoxazole w/trimethoprim (co-trimoxazole)], Sulfa antibiotics, Cheese, Corn-containing products, Egg-derived products, Latex, Milk-related compounds, Peanut-containing drug products, and Shellfish allergy    Review of Systems   Review of Systems  Constitutional:  Positive for activity change and appetite change. Negative for fever.  HENT:  Negative for congestion and nosebleeds.   Respiratory:  Negative for cough, chest tightness and shortness of breath.   Cardiovascular:  Negative for chest pain.  Gastrointestinal:  Positive for abdominal pain, nausea and vomiting.  Genitourinary:  Positive for dysuria. Negative for hematuria.  Musculoskeletal:  Negative for arthralgias and myalgias.  Neurological:  Negative for weakness and headaches.   all other systems are negative except as noted in the HPI and PMH.    Physical Exam Updated Vital Signs BP (!) 137/93   Pulse (!) 109   Temp 99.2 F (37.3 C) (Oral)   Resp (!) 21   Ht 5\' 2"  (1.575 m)   Wt 69.4 kg   LMP 04/16/2023 (Approximate)   SpO2 97%   BMI 27.98 kg/m  Physical Exam Vitals and nursing note reviewed.  Constitutional:      General: She is not in acute distress.    Appearance: She is well-developed.  HENT:     Head: Normocephalic and atraumatic.     Mouth/Throat:     Pharynx: No oropharyngeal exudate.  Eyes:     Conjunctiva/sclera: Conjunctivae normal.     Pupils: Pupils are equal, round, and reactive to light.  Neck:     Comments: No meningismus. Cardiovascular:     Rate and Rhythm: Regular rhythm. Tachycardia present.     Heart sounds: Normal heart sounds. No murmur heard. Pulmonary:     Effort: Pulmonary  effort is normal. No respiratory distress.     Breath sounds: Normal breath sounds.  Abdominal:     Palpations: Abdomen is soft.     Tenderness: There is abdominal tenderness. There is no guarding or rebound.     Comments: Soft, mild diffuse tenderness  Musculoskeletal:        General: No tenderness. Normal range of motion.     Cervical back: Normal range of motion and neck supple.  Skin:    General: Skin is warm.  Neurological:     Mental Status: She is alert and oriented to person, place, and time.     Cranial Nerves: No cranial nerve deficit.     Motor: No abnormal muscle tone.     Coordination: Coordination normal.     Comments:  5/5 strength throughout. CN 2-12 intact.Equal grip strength.   Psychiatric:        Behavior: Behavior normal.  ED Results / Procedures / Treatments   Labs (all labs ordered are listed, but only abnormal results are displayed) Labs Reviewed  URINALYSIS, ROUTINE W REFLEX MICROSCOPIC - Abnormal; Notable for the following components:      Result Value   Hgb urine dipstick TRACE (*)    Protein, ur 30 (*)    All other components within normal limits  CBC WITH DIFFERENTIAL/PLATELET - Abnormal; Notable for the following components:   WBC 23.2 (*)    Neutro Abs 20.0 (*)    Monocytes Absolute 1.8 (*)    Abs Immature Granulocytes 0.16 (*)    All other components within normal limits  COMPREHENSIVE METABOLIC PANEL - Abnormal; Notable for the following components:   Sodium 130 (*)    Chloride 95 (*)    Glucose, Bld 136 (*)    Total Protein 8.6 (*)    Total Bilirubin 1.3 (*)    All other components within normal limits  PREGNANCY, URINE  LIPASE, BLOOD  URINALYSIS, MICROSCOPIC (REFLEX)    EKG None  Radiology CT ABDOMEN PELVIS W CONTRAST  Result Date: 05/21/2023 CLINICAL DATA:  Generalized abdominal pain, vomiting, vomiting blood. EXAM: CT ABDOMEN AND PELVIS WITH CONTRAST TECHNIQUE: Multidetector CT imaging of the abdomen and pelvis was  performed using the standard protocol following bolus administration of intravenous contrast. RADIATION DOSE REDUCTION: This exam was performed according to the departmental dose-optimization program which includes automated exposure control, adjustment of the mA and/or kV according to patient size and/or use of iterative reconstruction technique. CONTRAST:  OMNIPAQUE IOHEXOL 300 MG/ML  SOLN COMPARISON:  06/25/2022. FINDINGS: Lower chest: No acute abnormality. Hepatobiliary: A subcentimeter hypodensity is present in the right lobe of the liver which is too small to further characterize. No biliary ductal dilatation. The gallbladder is without stones. Pancreas: Unremarkable. No pancreatic ductal dilatation or surrounding inflammatory changes. Spleen: Normal in size without focal abnormality. Adrenals/Urinary Tract: The adrenal glands are within normal limits. Nonobstructive renal calculi are noted bilaterally. There is patchy hypoenhancement of the right kidney with perinephric and periureteral fat stranding on the right. No obstructive uropathy bilaterally. Stomach/Bowel: There is thickening of the walls of the mid to distal esophagus. Stomach is within normal limits. Appendix appears normal. No bowel obstruction, free air, or pneumatosis. Vascular/Lymphatic: No significant vascular findings are present. No enlarged abdominal or pelvic lymph nodes. Reproductive: Uterus and bilateral adnexa are unremarkable. Other: No abdominopelvic ascites. Fat containing umbilical hernia is noted. Musculoskeletal: No acute osseous abnormality. IMPRESSION: 1. Thickening of the walls of the distal esophagus suggesting infectious or inflammatory esophagitis. Endoscopy is suggested for further evaluation on follow-up. 2. Patchy hypoenhancement of the right kidney with perinephric and periureteral fat stranding suggesting pyelonephritis. No obstructive uropathy bilaterally. 3. Bilateral nephrolithiasis. Electronically Signed   By:  Thornell Sartorius M.D.   On: 05/21/2023 01:49    Procedures Procedures    Medications Ordered in ED Medications  sodium chloride 0.9 % bolus 1,000 mL (1,000 mLs Intravenous New Bag/Given 05/20/23 2332)  ondansetron (ZOFRAN) injection 4 mg (4 mg Intravenous Given 05/20/23 2331)    ED Course/ Medical Decision Making/ A&P                                 Medical Decision Making Amount and/or Complexity of Data Reviewed Labs: ordered. Decision-making details documented in ED Course. Radiology: ordered and independent interpretation performed. Decision-making details documented in ED Course. ECG/medicine tests: ordered and  independent interpretation performed. Decision-making details documented in ED Course.  Risk Prescription drug management.   3 days of nausea and vomiting similar to previous cyclical vomiting syndrome.  Tachycardic.  Abdomen soft without peritoneal signs.  Will hydrate and treat symptoms.  Labs do show leukocytosis of 23. Patient does have chronic leukocytosis but this is higher than usual.  Hepatic function and lipase are normal.  She is tachycardic.  Hemoglobin is stable. Low suspicion for significant GI bleed.  CT scan shows suggestion of pyelonephritis as well as esophagitis.  She was prescribed Cipro today by her PCP but has yet that started this medication.  Urinalysis today is negative.  She reports history of ulcers in the past, unable to see previous EGD reports.  Given her ongoing tachycardia with esophagitis and pyelonephritis, recommend admission for fluids and monitoring, trending of hemoglobin..  May benefit from GI evaluation in the morning.  Patient adamant that she wants to go home and is feeling better. Discussed that she is at risk for deterioration given her reported coffee-ground emesis, persistent tachycardia and nausea and vomiting.  She states she will call her GI doctor in the morning.  She understands she will leaving AGAINST MEDICAL ADVICE  given her ongoing tachycardia and CT findings.  Hemoglobin is stable but there is still some concern for occult GI bleed. She appears to have capacity to make this decision.  Continue her antibiotics as prescribed by her PCP.  She understands she can return to the ED for reevaluation at any time.  She is leaving AGAINST MEDICAL ADVICE and appears to have capacity to make this decision.       Final Clinical Impression(s) / ED Diagnoses Final diagnoses:  Nausea and vomiting, unspecified vomiting type  Esophagitis    Rx / DC Orders ED Discharge Orders     None         Beautiful Pensyl, Jeannett Senior, MD 05/21/23 [phone]

## 2023-05-21 ENCOUNTER — Ambulatory Visit: Payer: Self-pay

## 2023-05-21 ENCOUNTER — Encounter (HOSPITAL_BASED_OUTPATIENT_CLINIC_OR_DEPARTMENT_OTHER): Payer: Self-pay | Admitting: Radiology

## 2023-05-21 ENCOUNTER — Other Ambulatory Visit: Payer: Self-pay

## 2023-05-21 ENCOUNTER — Emergency Department (HOSPITAL_BASED_OUTPATIENT_CLINIC_OR_DEPARTMENT_OTHER): Payer: Medicaid Other

## 2023-05-21 ENCOUNTER — Ambulatory Visit: Payer: Medicaid Other

## 2023-05-21 DIAGNOSIS — K2900 Acute gastritis without bleeding: Secondary | ICD-10-CM | POA: Insufficient documentation

## 2023-05-21 LAB — URINALYSIS, ROUTINE W REFLEX MICROSCOPIC
Bilirubin Urine: NEGATIVE
Glucose, UA: NEGATIVE mg/dL
Ketones, ur: NEGATIVE mg/dL
Leukocytes,Ua: NEGATIVE
Nitrite: NEGATIVE
Protein, ur: 30 mg/dL — AB
Specific Gravity, Urine: 1.015 (ref 1.005–1.030)
pH: 6.5 (ref 5.0–8.0)

## 2023-05-21 LAB — URINALYSIS, MICROSCOPIC (REFLEX): Bacteria, UA: NONE SEEN

## 2023-05-21 LAB — PREGNANCY, URINE: Preg Test, Ur: NEGATIVE

## 2023-05-21 MED ORDER — IOHEXOL 300 MG/ML  SOLN
100.0000 mL | Freq: Once | INTRAMUSCULAR | Status: AC | PRN
  Administered 2023-05-21: 100 mL via INTRAVENOUS

## 2023-05-21 MED ORDER — SODIUM CHLORIDE 0.9 % IV BOLUS
1000.0000 mL | Freq: Once | INTRAVENOUS | Status: AC
  Administered 2023-05-21: 1000 mL via INTRAVENOUS

## 2023-05-21 NOTE — ED Triage Notes (Signed)
Patient c/o generalized abdominal, back pain, and n/v. Sent by PCP for elevated WBC. Endorses fatigue and decreased appetite.

## 2023-05-21 NOTE — Discharge Instructions (Addendum)
You are leaving AGAINST MEDICAL ADVICE.  It was recommended that you be admitted to the hospital as your heart rate is high and you are still having vomiting and reported blood in the vomit.  You could have a GI bleed which can be life-threatening.  You also appear to have a significant infection of your kidney which can be life-threatening as well.  Take the antibiotic that was prescribed by your doctor.  Follow-up with your primary doctor and GI doctor.  Return to the ED if you with to be reevaluated.

## 2023-05-21 NOTE — Telephone Encounter (Signed)
  Chief Complaint: wanting a GI cocktail Symptoms: vomiting and burning throat Frequency: 1 week Disposition: [x] ED /[] Urgent Care (no appt availability in office) / [] Appointment(In office/virtual)/ []  [facility]/ [] Home Care/ [] Refused Recommended Disposition /[] [facility]/ []  Follow-up with PCP Additional Notes: wanting GI cocktail- GI on vacation/ PCP called during the triage call and demanded her to go to ED. Pt stated she will call her pharmacy to see if there are refills first. Pt wanted [facility] to call it in Advised pt it would be for her PCP and advised to go back to ED  Reason for Disposition  [1] MODERATE vomiting (e.g., 3 - 5 times/day) AND [2] age > 60 years  Answer Assessment - Initial Assessment Questions 1. VOMITING SEVERITY: "How many times have you vomited in the past 24 hours?"     - MILD:  1 - 2 times/day    - MODERATE: 3 - 5 times/day, decreased oral intake without significant weight loss or symptoms of dehydration    - SEVERE: 6 or more times/day, vomits everything or nearly everything, with significant weight loss, symptoms of dehydration      Unsure of frequency but has been for a  week 2. ONSET: "When did the vomiting begin?"      1 week  9. OTHER SYMPTOMS: "Do you have any other symptoms?" (e.g., fever, headache, vertigo, vomiting blood or coffee grounds, recent head injury)     Throat pain - wanting a GI cocktail 10. PREGNANCY: "Is there any chance you are pregnant?" "When was your last menstrual period?"       N/a  Protocols used: Vomiting-A-AH

## 2023-05-22 ENCOUNTER — Other Ambulatory Visit: Payer: Self-pay

## 2023-05-22 ENCOUNTER — Emergency Department (HOSPITAL_BASED_OUTPATIENT_CLINIC_OR_DEPARTMENT_OTHER)
Admission: EM | Admit: 2023-05-22 | Discharge: 2023-05-22 | Disposition: A | Discharge status: Home / Self Care | Payer: Medicaid Other | Source: Home / Self Care | Attending: Emergency Medicine | Admitting: Emergency Medicine

## 2023-05-22 ENCOUNTER — Encounter (HOSPITAL_BASED_OUTPATIENT_CLINIC_OR_DEPARTMENT_OTHER): Payer: Self-pay | Admitting: Emergency Medicine

## 2023-05-22 DIAGNOSIS — R112 Nausea with vomiting, unspecified: Secondary | ICD-10-CM

## 2023-05-22 DIAGNOSIS — K2900 Acute gastritis without bleeding: Secondary | ICD-10-CM

## 2023-05-22 LAB — COMPREHENSIVE METABOLIC PANEL
ALT: 13 U/L (ref 0–44)
AST: 19 U/L (ref 15–41)
Albumin: 3.8 g/dL (ref 3.5–5.0)
Alkaline Phosphatase: 74 U/L (ref 38–126)
Anion gap: 12 (ref 5–15)
BUN: 9 mg/dL (ref 6–20)
CO2: 20 mmol/L — ABNORMAL LOW (ref 22–32)
Calcium: 9 mg/dL (ref 8.9–10.3)
Chloride: 103 mmol/L (ref 98–111)
Creatinine, Ser: 0.84 mg/dL (ref 0.44–1.00)
GFR, Estimated: >60 mL/min (ref >60)
Glucose, Bld: 124 mg/dL — ABNORMAL HIGH (ref 70–99)
Potassium: 3.8 mmol/L (ref 3.5–5.1)
Sodium: 135 mmol/L (ref 135–145)
Total Bilirubin: 0.4 mg/dL (ref 0.3–1.2)
Total Protein: 7.3 g/dL (ref 6.5–8.1)

## 2023-05-22 LAB — CBC
HCT: 38.2 % (ref 36.0–46.0)
Hemoglobin: 12.6 g/dL (ref 12.0–15.0)
MCH: 32.5 pg (ref 26.0–34.0)
MCHC: 33 g/dL (ref 30.0–36.0)
MCV: 98.5 fL (ref 80.0–100.0)
Platelets: 141 10*3/uL — ABNORMAL LOW (ref 150–400)
RBC: 3.88 MIL/uL (ref 3.87–5.11)
RDW: 13.5 % (ref 11.5–15.5)
WBC: 14.1 10*3/uL — ABNORMAL HIGH (ref 4.0–10.5)
nRBC: 0 % (ref 0.0–0.2)

## 2023-05-22 LAB — LIPASE, BLOOD: Lipase: 22 U/L (ref 11–51)

## 2023-05-22 MED ORDER — METOCLOPRAMIDE HCL 5 MG/ML IJ SOLN
10.0000 mg | Freq: Once | INTRAMUSCULAR | Status: AC
  Administered 2023-05-22: 10 mg via INTRAVENOUS
  Filled 2023-05-22: qty 2

## 2023-05-22 MED ORDER — SODIUM CHLORIDE 0.9 % IV BOLUS
1000.0000 mL | Freq: Once | INTRAVENOUS | Status: AC
  Administered 2023-05-22: 1000 mL via INTRAVENOUS

## 2023-05-22 MED ORDER — ONDANSETRON HCL 4 MG/2ML IJ SOLN
4.0000 mg | Freq: Once | INTRAMUSCULAR | Status: AC
  Administered 2023-05-22: 4 mg via INTRAVENOUS
  Filled 2023-05-22: qty 2

## 2023-05-22 MED ORDER — ALUM & MAG HYDROXIDE-SIMETH 200-200-20 MG/5ML PO SUSP
30.0000 mL | Freq: Once | ORAL | Status: AC
  Administered 2023-05-22: 30 mL via ORAL
  Filled 2023-05-22: qty 30

## 2023-05-22 NOTE — ED Provider Notes (Signed)
Emergency Department Provider Note   I have reviewed the triage vital signs and the nursing notes.   HISTORY  Chief Complaint Abdominal Pain   HPI Natalie Joseph is a 42 y.o. female past history of esophagitis, cyclical vomiting, gastritis presents to the emergency department with return of vomiting symptoms.  She was seen in the emergency department on 9/12 with full workup including labs and CT imaging.  She had an elevated white count without fever.  She ultimately left AGAINST MEDICAL ADVICE and follow-up with her PCP today who in light of the elevated white count advised that she return to the emergency department.  She had been able to tolerate small amounts of food in the past 24 hours but did eat some potato chips and had vomiting in the ED waiting room prior to my evaluation.  Denies any chest pain or shortness of breath.  She has epigastric abdominal pain typical of her cyclical vomiting. No hematemesis. Has a GI appointment scheduled in the coming week.    Past Medical History:  Diagnosis Date   Adjustment disorder with mixed anxiety and depressed mood    Cervical radiculopathy    Cyclic vomiting syndrome    Gastritis    Myalgia    Panic attack    Pheochromocytoma    Renal disorder     Review of Systems  Constitutional: No fever/chills Cardiovascular: Denies chest pain. Respiratory: Denies shortness of breath. Gastrointestinal: Positive epigastric abdominal pain. Positive nausea and vomiting.  No diarrhea.  No constipation. Genitourinary: Negative for dysuria. Musculoskeletal: Negative for back pain. Skin: Negative for rash. Neurological: Negative for headaches.  ____________________________________________   PHYSICAL EXAM:  VITAL SIGNS: ED Triage Vitals  Encounter Vitals Group     BP 05/21/23 2344 120/72     Pulse Rate 05/21/23 2344 (!) 109     Resp 05/21/23 2344 (!) 24     Temp 05/21/23 2344 98.5 F (36.9 C)     Temp Source 05/21/23 2344  Oral     SpO2 05/21/23 2344 100 %     Weight 05/22/23 0002 156 lb (70.8 kg)     Height 05/22/23 0002 5\' 2"  (1.575 m)   Constitutional: Alert and oriented. Well appearing and in no acute distress. Eyes: Conjunctivae are normal.  Head: Atraumatic. Nose: No congestion/rhinnorhea. Mouth/Throat: Mucous membranes are slightly dry.  Neck: No stridor.   Cardiovascular: Tachycardia. Good peripheral circulation. Grossly normal heart sounds.   Respiratory: Normal respiratory effort.  No retractions. Lungs CTAB. Gastrointestinal: Soft and nontender. No distention.  Musculoskeletal: No lower extremity tenderness nor edema. No gross deformities of extremities. Neurologic:  Normal speech and language. No gross focal neurologic deficits are appreciated.  Skin:  Skin is warm, dry and intact. No rash noted.  ____________________________________________   LABS (all labs ordered are listed, but only abnormal results are displayed)  Labs Reviewed  COMPREHENSIVE METABOLIC PANEL - Abnormal; Notable for the following components:      Result Value   CO2 20 (*)    Glucose, Bld 124 (*)    All other components within normal limits  CBC - Abnormal; Notable for the following components:   WBC 14.1 (*)    Platelets 141 (*)    All other components within normal limits  LIPASE, BLOOD  URINALYSIS, ROUTINE W REFLEX MICROSCOPIC  PREGNANCY, URINE   ____________________________________________  RADIOLOGY  No results found.  ____________________________________________   PROCEDURES  Procedure(s) performed:   Procedures   ____________________________________________   INITIAL IMPRESSION /  ASSESSMENT AND PLAN / ED COURSE  Pertinent labs & imaging results that were available during my care of the patient were reviewed by me and considered in my medical decision making (see chart for details).   This patient is Presenting for Evaluation of abdominal pain, which does require a range of treatment  options, and is a complaint that involves a high risk of morbidity and mortality.  The Differential Diagnoses includes but is not exclusive to acute cholecystitis, intrathoracic causes for epigastric abdominal pain, gastritis, duodenitis, pancreatitis, small bowel or large bowel obstruction, abdominal aortic aneurysm, hernia, gastritis, etc.   Critical Interventions-    Medications  sodium chloride 0.9 % bolus 1,000 mL (has no administration in time range)  ondansetron (ZOFRAN) injection 4 mg (has no administration in time range)  metoCLOPramide (REGLAN) injection 10 mg (has no administration in time range)  alum & mag hydroxide-simeth (MAALOX/MYLANTA) 200-200-20 MG/5ML suspension 30 mL (has no administration in time range)    Reassessment after intervention: symptoms improved.    I decided to review pertinent External Data, and in summary ED visit from 05/20/23 reviewed along with CT abdomen/pelvis.   Clinical Laboratory Tests Ordered, included CBC with much improved leukocytosis now 14 from 23. No anemia. No AKI. LFTs and lipase negative.   Radiologic Tests: Considered CT abdomen/pelvis but patient with scan in the last 36 hours. Will defer for now. Abdominal exam is reassuring.    Cardiac Monitor Tracing which shows NSR. Tachycardia improving with IVF.    Social Determinants of Health Risk patient is a smoker.   Consult complete with  Medical Decision Making: Summary:  Patient returns to the emergency department with emesis.  Lab work is looking improved compared to her last ED visit with downtrending leukocytosis.  No fever.  Doubt sepsis, although considered.  No UTI symptoms to correlate with her radiographic finding of pyelonephritis.  She is on antibiotics as well.   Reevaluation with update and discussion with   ***Considered admission***  Patient's presentation is most consistent with acute presentation with potential threat to life or bodily function.   Disposition:    ____________________________________________  FINAL CLINICAL IMPRESSION(S) / ED DIAGNOSES  Final diagnoses:  None     NEW OUTPATIENT MEDICATIONS STARTED DURING THIS VISIT:  New Prescriptions   No medications on file    Note:  This document was prepared using Dragon voice recognition software and may include unintentional dictation errors.  Alona Bene, MD, [facility]

## 2023-05-22 NOTE — Discharge Instructions (Signed)
Please continue your home medications including antibiotics. Follow with your GI doctors next week as scheduled.

## 2023-05-28 ENCOUNTER — Ambulatory Visit (INDEPENDENT_AMBULATORY_CARE_PROVIDER_SITE_OTHER): Payer: Medicaid Other | Admitting: General Practice

## 2023-05-28 ENCOUNTER — Other Ambulatory Visit (HOSPITAL_COMMUNITY)
Admission: RE | Admit: 2023-05-28 | Discharge: 2023-05-28 | Disposition: A | Discharge status: Home / Self Care | Payer: Medicaid Other | Source: Ambulatory Visit | Attending: Obstetrics and Gynecology | Admitting: Obstetrics and Gynecology

## 2023-05-28 VITALS — BP 123/73 | HR 105 | Ht 62.0 in | Wt 159.0 lb

## 2023-05-28 DIAGNOSIS — Z113 Encounter for screening for infections with a predominantly sexual mode of transmission: Secondary | ICD-10-CM | POA: Diagnosis present

## 2023-05-28 NOTE — Progress Notes (Signed)
SUBJECTIVE:  42 y.o. female presents for STD testing. Denies abnormal vaginal bleeding or significant pelvic pain or fever. No UTI symptoms. Denies history of known exposure to STD.  Patient's last menstrual period was 05/20/2023 (approximate).  OBJECTIVE:  She appears well, afebrile. Urine dipstick: not done.  ASSESSMENT:  Vaginal Discharge  Vaginal Odor   PLAN:  GC, chlamydia, trichomonas, BVAG, CVAG probe sent to lab. Treatment: To be determined once lab results are received ROV prn if symptoms persist or worsen.

## 2023-05-29 LAB — RPR: RPR Ser Ql: NONREACTIVE

## 2023-05-29 LAB — HEPATITIS C ANTIBODY: Hep C Virus Ab: NONREACTIVE

## 2023-05-29 LAB — HEPATITIS B SURFACE ANTIGEN: Hepatitis B Surface Ag: NEGATIVE

## 2023-05-29 LAB — HIV ANTIBODY (ROUTINE TESTING W REFLEX): HIV Screen 4th Generation wRfx: NONREACTIVE

## 2023-05-31 LAB — CERVICOVAGINAL ANCILLARY ONLY
Bacterial Vaginitis (gardnerella): NEGATIVE
Candida Glabrata: NEGATIVE
Candida Vaginitis: NEGATIVE
Chlamydia: NEGATIVE
Comment: NEGATIVE
Comment: NEGATIVE
Comment: NEGATIVE
Comment: NEGATIVE
Comment: NEGATIVE
Comment: NORMAL
Neisseria Gonorrhea: NEGATIVE
Trichomonas: NEGATIVE

## 2023-06-16 ENCOUNTER — Ambulatory Visit: Payer: Medicaid Other | Admitting: Obstetrics and Gynecology

## 2023-07-09 DEATH — deceased
